# Patient Record
Sex: Female | Born: 1950 | Race: Black or African American | Hispanic: No | Marital: Married | Smoking: Never smoker
Health system: Southern US, Community
[De-identification: ages and names within clinical notes are randomized; demographics above are authoritative.]

## PROBLEM LIST (undated history)

## (undated) DIAGNOSIS — C801 Malignant (primary) neoplasm, unspecified: Secondary | ICD-10-CM

## (undated) DIAGNOSIS — E785 Hyperlipidemia, unspecified: Secondary | ICD-10-CM

## (undated) DIAGNOSIS — Z923 Personal history of irradiation: Secondary | ICD-10-CM

## (undated) DIAGNOSIS — Z973 Presence of spectacles and contact lenses: Secondary | ICD-10-CM

## (undated) DIAGNOSIS — I10 Essential (primary) hypertension: Secondary | ICD-10-CM

## (undated) HISTORY — PX: WISDOM TOOTH EXTRACTION: SHX21

## (undated) HISTORY — DX: Essential (primary) hypertension: I10

## (undated) HISTORY — PX: ABDOMINAL HYSTERECTOMY: SHX81

## (undated) HISTORY — PX: DIAGNOSTIC LAPAROSCOPY: SUR761

## (undated) HISTORY — PX: CATARACT EXTRACTION: SUR2

## (undated) HISTORY — PX: COLONOSCOPY W/ BIOPSIES AND POLYPECTOMY: SHX1376

---

## 2005-09-05 ENCOUNTER — Encounter: Admission: RE | Admit: 2005-09-05 | Discharge: 2005-09-05 | Payer: Self-pay | Admitting: Obstetrics & Gynecology

## 2006-09-08 ENCOUNTER — Encounter: Admission: RE | Admit: 2006-09-08 | Discharge: 2006-09-08 | Payer: Self-pay | Admitting: Obstetrics & Gynecology

## 2008-03-08 ENCOUNTER — Encounter: Admission: RE | Admit: 2008-03-08 | Discharge: 2008-03-08 | Payer: Self-pay | Admitting: Obstetrics & Gynecology

## 2009-04-10 ENCOUNTER — Encounter: Admission: RE | Admit: 2009-04-10 | Discharge: 2009-04-10 | Payer: Self-pay | Admitting: Obstetrics & Gynecology

## 2010-05-19 ENCOUNTER — Other Ambulatory Visit: Payer: Self-pay | Admitting: Family Medicine

## 2010-05-19 DIAGNOSIS — Z1239 Encounter for other screening for malignant neoplasm of breast: Secondary | ICD-10-CM

## 2010-06-04 ENCOUNTER — Ambulatory Visit: Payer: Self-pay

## 2010-06-20 ENCOUNTER — Ambulatory Visit
Admission: RE | Admit: 2010-06-20 | Discharge: 2010-06-20 | Disposition: A | Discharge status: Home / Self Care | Payer: BC Managed Care – PPO | Source: Ambulatory Visit | Attending: Family Medicine | Admitting: Family Medicine

## 2010-06-20 DIAGNOSIS — Z1239 Encounter for other screening for malignant neoplasm of breast: Secondary | ICD-10-CM

## 2011-07-09 ENCOUNTER — Other Ambulatory Visit: Payer: Self-pay | Admitting: Family Medicine

## 2011-07-09 DIAGNOSIS — Z1231 Encounter for screening mammogram for malignant neoplasm of breast: Secondary | ICD-10-CM

## 2011-07-15 ENCOUNTER — Ambulatory Visit
Admission: RE | Admit: 2011-07-15 | Discharge: 2011-07-15 | Disposition: A | Discharge status: Home / Self Care | Payer: BC Managed Care – PPO | Source: Ambulatory Visit | Attending: Family Medicine | Admitting: Family Medicine

## 2011-07-15 DIAGNOSIS — Z1231 Encounter for screening mammogram for malignant neoplasm of breast: Secondary | ICD-10-CM

## 2012-06-23 ENCOUNTER — Other Ambulatory Visit: Payer: Self-pay

## 2012-06-23 DIAGNOSIS — Z1231 Encounter for screening mammogram for malignant neoplasm of breast: Secondary | ICD-10-CM

## 2012-07-17 ENCOUNTER — Ambulatory Visit
Admission: RE | Admit: 2012-07-17 | Discharge: 2012-07-17 | Disposition: A | Discharge status: Home / Self Care | Payer: BC Managed Care – PPO | Source: Ambulatory Visit

## 2012-07-17 DIAGNOSIS — Z1231 Encounter for screening mammogram for malignant neoplasm of breast: Secondary | ICD-10-CM

## 2013-06-23 ENCOUNTER — Other Ambulatory Visit: Payer: Self-pay

## 2013-06-23 DIAGNOSIS — Z1231 Encounter for screening mammogram for malignant neoplasm of breast: Secondary | ICD-10-CM

## 2013-07-19 ENCOUNTER — Ambulatory Visit
Admission: RE | Admit: 2013-07-19 | Discharge: 2013-07-19 | Disposition: A | Discharge status: Home / Self Care | Payer: BC Managed Care – PPO | Source: Ambulatory Visit

## 2013-07-19 DIAGNOSIS — Z1231 Encounter for screening mammogram for malignant neoplasm of breast: Secondary | ICD-10-CM

## 2014-06-20 ENCOUNTER — Other Ambulatory Visit: Payer: Self-pay

## 2014-06-20 DIAGNOSIS — Z1231 Encounter for screening mammogram for malignant neoplasm of breast: Secondary | ICD-10-CM

## 2014-07-21 ENCOUNTER — Ambulatory Visit
Admission: RE | Admit: 2014-07-21 | Discharge: 2014-07-21 | Disposition: A | Discharge status: Home / Self Care | Payer: BC Managed Care – PPO | Source: Ambulatory Visit

## 2014-07-21 DIAGNOSIS — Z1231 Encounter for screening mammogram for malignant neoplasm of breast: Secondary | ICD-10-CM

## 2015-07-19 ENCOUNTER — Other Ambulatory Visit: Payer: Self-pay

## 2015-07-19 DIAGNOSIS — Z1231 Encounter for screening mammogram for malignant neoplasm of breast: Secondary | ICD-10-CM

## 2015-08-04 ENCOUNTER — Ambulatory Visit
Admission: RE | Admit: 2015-08-04 | Discharge: 2015-08-04 | Disposition: A | Discharge status: Home / Self Care | Payer: BC Managed Care – PPO | Source: Ambulatory Visit

## 2015-08-04 DIAGNOSIS — Z1231 Encounter for screening mammogram for malignant neoplasm of breast: Secondary | ICD-10-CM

## 2016-07-23 ENCOUNTER — Other Ambulatory Visit: Payer: Self-pay | Admitting: Family Medicine

## 2016-07-23 DIAGNOSIS — Z1231 Encounter for screening mammogram for malignant neoplasm of breast: Secondary | ICD-10-CM

## 2016-08-13 ENCOUNTER — Ambulatory Visit
Admission: RE | Admit: 2016-08-13 | Discharge: 2016-08-13 | Disposition: A | Discharge status: Home / Self Care | Payer: BC Managed Care – PPO | Source: Ambulatory Visit | Attending: Family Medicine | Admitting: Family Medicine

## 2016-08-13 DIAGNOSIS — Z1231 Encounter for screening mammogram for malignant neoplasm of breast: Secondary | ICD-10-CM

## 2017-04-29 HISTORY — PX: BREAST LUMPECTOMY: SHX2

## 2017-07-24 ENCOUNTER — Other Ambulatory Visit: Payer: Self-pay | Admitting: Family Medicine

## 2017-07-24 DIAGNOSIS — Z1231 Encounter for screening mammogram for malignant neoplasm of breast: Secondary | ICD-10-CM

## 2017-08-15 ENCOUNTER — Ambulatory Visit
Admission: RE | Admit: 2017-08-15 | Discharge: 2017-08-15 | Disposition: A | Discharge status: Home / Self Care | Payer: BC Managed Care – PPO | Source: Ambulatory Visit | Attending: Family Medicine | Admitting: Family Medicine

## 2017-08-15 ENCOUNTER — Encounter: Payer: Self-pay | Admitting: Radiology

## 2017-08-15 DIAGNOSIS — Z1231 Encounter for screening mammogram for malignant neoplasm of breast: Secondary | ICD-10-CM

## 2017-08-18 ENCOUNTER — Other Ambulatory Visit: Payer: Self-pay | Admitting: Family Medicine

## 2017-08-18 DIAGNOSIS — R921 Mammographic calcification found on diagnostic imaging of breast: Secondary | ICD-10-CM

## 2017-08-21 ENCOUNTER — Other Ambulatory Visit: Payer: Self-pay | Admitting: Family Medicine

## 2017-08-21 ENCOUNTER — Ambulatory Visit
Admission: RE | Admit: 2017-08-21 | Discharge: 2017-08-21 | Disposition: A | Discharge status: Home / Self Care | Payer: Medicare Other | Source: Ambulatory Visit | Attending: Family Medicine | Admitting: Family Medicine

## 2017-08-21 DIAGNOSIS — R921 Mammographic calcification found on diagnostic imaging of breast: Secondary | ICD-10-CM

## 2017-08-25 ENCOUNTER — Ambulatory Visit
Admission: RE | Admit: 2017-08-25 | Discharge: 2017-08-25 | Disposition: A | Discharge status: Home / Self Care | Payer: Medicare Other | Source: Ambulatory Visit | Attending: Family Medicine | Admitting: Family Medicine

## 2017-08-25 DIAGNOSIS — R921 Mammographic calcification found on diagnostic imaging of breast: Secondary | ICD-10-CM

## 2017-08-27 ENCOUNTER — Telehealth: Payer: Self-pay | Admitting: Hematology

## 2017-08-27 DIAGNOSIS — C801 Malignant (primary) neoplasm, unspecified: Secondary | ICD-10-CM

## 2017-08-27 HISTORY — DX: Malignant (primary) neoplasm, unspecified: C80.1

## 2017-08-27 NOTE — Telephone Encounter (Signed)
Spoke to patient to confirm afternoon [facility] appointment for 5/8, packet mailed to patient

## 2017-08-29 ENCOUNTER — Encounter: Payer: Self-pay | Admitting: *Deleted

## 2017-08-29 ENCOUNTER — Telehealth: Payer: Self-pay | Admitting: Hematology

## 2017-08-29 DIAGNOSIS — D0512 Intraductal carcinoma in situ of left breast: Secondary | ICD-10-CM

## 2017-08-29 NOTE — Telephone Encounter (Signed)
Spoke with patient to change arrive time for [facility] appointment, patient advised she will be here

## 2017-09-01 NOTE — Progress Notes (Signed)
[place]  [phone] Fax:([phone]) [place] Note   Patient Care Team: Donald Prose, MD as PCP - General (Family Medicine) Stark Klein, MD as Consulting Physician (General Surgery) Truitt Merle, MD as Consulting Physician (Hematology) Kyung Rudd, MD as Consulting Physician (Radiation Oncology) 09/03/2017  CHIEF COMPLAINTS/PURPOSE OF CONSULTATION:  Ductal carcinoma in situ (DCIS) of left breast  Oncology History   Cancer Staging Ductal carcinoma in situ (DCIS) of left breast Staging form: Breast, AJCC 8th Edition - Clinical stage from 08/25/2017: Stage 0 (cTis (DCIS), cN0, cM0, ER: Unknown, PR: Unknown, HER2: Not Assessed) - Signed by Truitt Merle, MD on 09/03/2017       Ductal carcinoma in situ (DCIS) of left breast   08/21/2017 Mammogram    IMPRESSION: New grouped pleomorphic calcifications within the upper-outer quadrant of the LEFT breast, spanning 4 mm extent. This is a suspicious finding for which stereotactic biopsy is recommended.      08/25/2017 Initial Biopsy    Diagnosis 08/25/17 Breast, left, needle core biopsy, upper outer quadrant coil clip - DUCTAL CARCINOMA IN SITU WITH CALCIFICATIONS, INTERMEDIATE GRADE. - DUCTAL PAPILLOMA. - SEE MICROSCOPIC DESCRIPTION.      08/25/2017 Cancer Staging    Staging form: Breast, AJCC 8th Edition - Clinical stage from 08/25/2017: Stage 0 (cTis (DCIS), cN0, cM0, ER: Unknown, PR: Unknown, HER2: Not Assessed) - Signed by Truitt Merle, MD on 09/03/2017      08/29/2017 Initial Diagnosis    Ductal carcinoma in situ (DCIS) of left breast       HISTORY OF PRESENTING ILLNESS:  Victoria Fuller 67 y.o. female is a here because of newly diagnosed DCIS. The patient was referred by her PCP. The patient presents to the clinic today accompanied by her husband and family member.  Prior to pt's abnormal mammogram, she states she did not feel any abnormality. She states she previously had no other abnormal  mammogram results and she was compliant with yearly screening. She denies any unintentional weight lost and she only has mild joint pain in her back occasionally.   Pt's screening mammogram from 08/15/17 warranted further evaluation. Her diagnostic mammogram from 08/21/17 revealed new grouped pleomorphic calcifications within the upper-outer quadrant of the LEFT breast, spanning 4 mm extent. Her initial biopsy confirmed DCIS.   She has a medical history of well-controlled HTN. She has a surgical history of hysterectomy.   GYN HISTORY  Menarchal: 11-12 LMP: Hysterectomy at age [age], she believes she had her ovaries removed HRT: none  G0P0:  She has a FMHx of Ovarian CA by her mother in her 77s. She denies tobacco use and she drinks alcohol rarely.   Socially, she is married and she is a retired Electrical engineer.   MEDICAL HISTORY:  Past Medical History:  Diagnosis Date  . Hypertension     SURGICAL HISTORY: Past Surgical History:  Procedure Laterality Date  . ABDOMINAL HYSTERECTOMY      SOCIAL HISTORY: Social History   Socioeconomic History  . Marital status: Married    Spouse name: Not on file  . Number of children: Not on file  . Years of education: Not on file  . Highest education level: Not on file  Occupational History  . Not on file  Social Needs  . Financial resource strain: Not on file  . Food insecurity:    Worry: Not on file    Inability: Not on file  . Transportation needs:    Medical: Not on file  Non-medical: Not on file  Tobacco Use  . Smoking status: Never Smoker  . Smokeless tobacco: Never Used  Substance and Sexual Activity  . Alcohol use: Not Currently  . Drug use: Never  . Sexual activity: Not on file  Lifestyle  . Physical activity:    Days per week: Not on file    Minutes per session: Not on file  . Stress: Not on file  Relationships  . Social connections:    Talks on phone: Not on file    Gets together: Not on file    Attends  religious service: Not on file    Active member of club or organization: Not on file    Attends meetings of clubs or organizations: Not on file    Relationship status: Not on file  . Intimate partner violence:    Fear of current or ex partner: Not on file    Emotionally abused: Not on file    Physically abused: Not on file    Forced sexual activity: Not on file  Other Topics Concern  . Not on file  Social History Narrative  . Not on file    FAMILY HISTORY: Family History  Problem Relation Age of Onset  . Cancer Mother 84       ovarian cancer     ALLERGIES:  has No Known Allergies.  MEDICATIONS:  Current Outpatient Medications  Medication Sig Dispense Refill  . amLODipine (NORVASC) 5 MG tablet Take 5 mg by mouth daily.    Marland Kitchen aspirin 81 MG chewable tablet Chew 81 mg by mouth daily.    Marland Kitchen atorvastatin (LIPITOR) 10 MG tablet Take 10 mg by mouth daily.    . hydrochlorothiazide (HYDRODIURIL) 25 MG tablet Take 25 mg by mouth daily.     No current facility-administered medications for this visit.     REVIEW OF SYSTEMS:   Constitutional: Denies fevers, chills or abnormal night sweats Eyes: Denies blurriness of vision, double vision or watery eyes Ears, nose, mouth, throat, and face: Denies mucositis or sore throat Respiratory: Denies cough, dyspnea or wheezes Cardiovascular: Denies palpitation, chest discomfort or lower extremity swelling Gastrointestinal:  Denies nausea, heartburn or change in bowel habits Skin: Denies abnormal skin rashes Lymphatics: Denies new lymphadenopathy or easy bruising Neurological:Denies numbness, tingling or new weaknesses Behavioral/Psych: Mood is stable, no new changes  MSK: (+) back pain, occasionally  All other systems were reviewed with the patient and are negative.  PHYSICAL EXAMINATION: ECOG PERFORMANCE STATUS: 0 - Asymptomatic  Vitals:   09/03/17 1238  BP: 131/81  Pulse: 75  Resp: 18  Temp: 98.3 F (36.8 C)  SpO2: 100%   Filed  Weights   09/03/17 1238  Weight: 180 lb 8 oz (81.9 kg)    GENERAL:alert, no distress and comfortable SKIN: skin color, texture, turgor are normal, no rashes or significant lesions EYES: normal, conjunctiva are pink and non-injected, sclera clear OROPHARYNX:no exudate, no erythema and lips, buccal mucosa, and tongue normal  NECK: supple, thyroid normal size, non-tender, without nodularity LYMPH:  no palpable lymphadenopathy in the cervical, axillary or inguinal LUNGS: clear to auscultation and percussion with normal breathing effort HEART: regular rate & rhythm and no murmurs and no lower extremity edema ABDOMEN:abdomen soft, non-tender and normal bowel sounds Musculoskeletal:no cyanosis of digits and no clubbing  PSYCH: alert & oriented x 3 with fluent speech NEURO: no focal motor/sensory deficits Breasts: Breast inspection showed them to be symmetrical with no nipple discharge. Palpation of the right breast and axilla  revealed no obvious mass that I could appreciate. (+) She has a 1 cm lump at the 1 o'clock position in the right breast.   LABORATORY DATA:  I have reviewed the data as listed CBC Latest Ref Rng & Units 09/03/2017  WBC 3.9 - 10.3 K/uL 3.4(L)  Hemoglobin 11.6 - 15.9 g/dL 13.5  Hematocrit 34.8 - 46.6 % 40.1  Platelets 145 - 400 K/uL 177   CMP Latest Ref Rng & Units 09/03/2017  Glucose 70 - 140 mg/dL 101  BUN 7 - 26 mg/dL 7  Creatinine 0.60 - 1.10 mg/dL 0.74  Sodium 136 - 145 mmol/L 142  Potassium 3.5 - 5.1 mmol/L 3.2(L)  Chloride 98 - 109 mmol/L 102  CO2 22 - 29 mmol/L 32(H)  Calcium 8.4 - 10.4 mg/dL 10.5(H)  Total Protein 6.4 - 8.3 g/dL 7.5  Total Bilirubin 0.2 - 1.2 mg/dL 1.9(H)  Alkaline Phos 40 - 150 U/L 73  AST 5 - 34 U/L 22  ALT 0 - 55 U/L 40    PATHOLOGY  Diagnosis 08/25/17 Breast, left, needle core biopsy, upper outer quadrant coil clip - DUCTAL CARCINOMA IN SITU WITH CALCIFICATIONS, INTERMEDIATE GRADE. - DUCTAL PAPILLOMA. - SEE MICROSCOPIC  DESCRIPTION. Microscopic Comment Estrogen and progesterone receptor will be performed. Dr. Melina Copa agrees. Called to [facility] on 08/26/17. (JDP:ah 08/26/17)  RADIOGRAPHIC STUDIES: I have personally reviewed the radiological images as listed and agreed with the findings in the report.  Diagnostic Mammogram 08/21/17 IMPRESSION: New grouped pleomorphic calcifications within the upper-outer quadrant of the LEFT breast, spanning 4 mm extent. This is a suspicious finding for which stereotactic biopsy is recommended.  Screening Mammogram 08/15/17 IMPRESSION: Further evaluation is suggested for calcifications in the left Breast.  Mm Digital Diagnostic Unilat L  Result Date: 08/21/2017 CLINICAL DATA:  Patient returns today to evaluate LEFT breast calcifications identified on recent screening mammogram. EXAM: DIGITAL DIAGNOSTIC LEFT MAMMOGRAM WITH CAD COMPARISON:  Previous exam(s). ACR Breast Density Category c: The breast tissue is heterogeneously dense, which may obscure small masses. FINDINGS: New grouped pleomorphic calcifications are confirmed within the upper outer quadrant of the LEFT breast, at middle depth, spanning 4 mm. Mammographic images were processed with CAD. IMPRESSION: New grouped pleomorphic calcifications within the upper-outer quadrant of the LEFT breast, spanning 4 mm extent. This is a suspicious finding for which stereotactic biopsy is recommended. RECOMMENDATION: Stereotactic biopsy, with 3D tomosynthesis guidance, for the LEFT breast calcifications. Stereotactic biopsy is scheduled for April 29th. I have discussed the findings and recommendations with the patient. Results were also provided in writing at the conclusion of the visit. If applicable, a reminder letter will be sent to the patient regarding the next appointment. BI-RADS CATEGORY  4: Suspicious. Electronically Signed   By: Franki Cabot M.D.   On: 08/21/2017 09:26   Mm Digital Screening  Bilateral  Result Date: 08/15/2017 CLINICAL DATA:  Screening. EXAM: DIGITAL SCREENING BILATERAL MAMMOGRAM WITH CAD COMPARISON:  Previous exam(s). ACR Breast Density Category c: The breast tissue is heterogeneously dense, which may obscure small masses. FINDINGS: In the left breast, calcifications warrant further evaluation. In the right breast, no findings suspicious for malignancy. Images were processed with CAD. IMPRESSION: Further evaluation is suggested for calcifications in the left breast. RECOMMENDATION: Diagnostic mammogram of the left breast. (Code:[id number]-30M) The patient will be contacted regarding the findings, and additional imaging will be scheduled. BI-RADS CATEGORY  0: Incomplete. Need additional imaging evaluation and/or prior mammograms for comparison. Electronically Signed   By: Remo Lipps  Joneen Caraway M.D.   On: 08/15/2017 16:41   Mm Clip Placement Left  Result Date: 08/25/2017 CLINICAL DATA:  Status post stereotactic guided core biopsy of calcifications in the UPPER-OUTER QUADRANT LEFT breast. EXAM: DIAGNOSTIC LEFT MAMMOGRAM POST STEREOTACTIC BIOPSY COMPARISON:  08/21/2017 and earlier FINDINGS: Mammographic images were obtained following stereotactic guided biopsy of calcifications in the UPPER-OUTER QUADRANT of the LEFT breast. A coil shaped clip is identified adjacent to residual calcifications in the UPPER-OUTER QUADRANT. IMPRESSION: Tissue marker clip in the expected location following biopsy. Final Assessment: Post Procedure Mammograms for Marker Placement Electronically Signed   By: Nolon Nations M.D.   On: 08/25/2017 13:35   Mm Lt Breast Bx W Loc Dev 1st Lesion Image Bx Spec Stereo Guide  Addendum Date: 08/26/2017   ADDENDUM REPORT: 08/26/2017 15:28 ADDENDUM: Pathology revealed INTERMEDIATE GRADE DUCTAL CARCINOMA IN SITU WITH CALCIFICATIONS, DUCTAL PAPILLOMA of the Left breast, upper outer quadrant (coil clip). This was found to be concordant by Dr. Nolon Nations. Pathology results were  discussed with the patient by telephone. The patient reported doing well after the biopsy with tenderness at the site. Post biopsy instructions and care were reviewed and questions were answered. The patient was encouraged to call [facility] for any additional concerns. The patient was referred to [facility] on Sep 03, 2017. Pathology results reported by Terie Purser, RN on 08/26/2017. Electronically Signed   By: Nolon Nations M.D.   On: 08/26/2017 15:28   Result Date: 08/26/2017 CLINICAL DATA:  Patient presents for stereotactic guided core biopsy of calcifications in the LEFT breast. EXAM: LEFT BREAST STEREOTACTIC CORE NEEDLE BIOPSY COMPARISON:  Previous exams. FINDINGS: The patient and I discussed the procedure of stereotactic-guided biopsy including benefits and alternatives. We discussed the high likelihood of a successful procedure. We discussed the risks of the procedure including infection, bleeding, tissue injury, clip migration, and inadequate sampling. Informed written consent was given. The usual time out protocol was performed immediately prior to the procedure. Using sterile technique and 1% Lidocaine as local anesthetic, under stereotactic guidance, a 9 gauge vacuum assisted device was used to perform core needle biopsy of calcifications in the UPPER-OUTER QUADRANT of the LEFT breast using a superior to inferior approach. Specimen radiograph was performed showing calcifications in multiple specimens. Specimens with calcifications are identified for pathology. Lesion quadrant: UPPER-OUTER QUADRANT LEFT breast At the conclusion of the procedure, a coil shaped tissue marker clip was deployed into the biopsy cavity. Follow-up 2-view mammogram was performed and dictated separately. IMPRESSION: Stereotactic-guided biopsy of LEFT breast calcifications. No apparent complications. Electronically Signed:  By: Nolon Nations M.D. On: 08/25/2017 13:26    ASSESSMENT & PLAN:  Victoria Fuller is 67 year old post-menopausal woman, presented with screening discovered to breast cancer.   1. 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 Stage 0 grade 2, ER/PR pending --We discussed her imaging findings and the biopsy results in great details. --She is a candidate for breast conservation surgery. She has been seen by breast surgeon Dr. Barry Dienes, who recommends lumpectomy. I discussed this is the standard treatment.  -I discussed that she may be a candidate for the COMET Study which will be surveillance vs surgery if ER or PR positive. She will think about it but right now she prefers to have her cancer removed.  -Her DCIS will be cured by complete surgical resection. Any form of adjuvant therapy is preventive. -We  also discussed that biopsy may have sampling limitation, we will review her surgical path, to see if she has any invasive carcinoma components. -She was also seen by radiation oncologist Dr. Lisbeth Renshaw today. She will need adjuvant Radiation Therapy to reduce her risk of local recurrence.  -She is candidate for genetic testing due to her personal history of cancer and her family history of cancer. She would like to think about it. I discussed that if she had a gene mutation such as BRCA1 or BRCA2, we may recommend her to have mastectomy.  -Her ER/PR results are pending but if it is positive I would recommend adjuvant endocrine therapy for a total of 5 years to prevent future breast cancer. Potential benefits and side effects were discussed with patient and she is interested. Plan to start once she recovers from radiation if she is a candidate.  -I reviewed her labs with her today, she has elevated bilirubin at 1.9, and decreased potasium. I encouraged her increase K in her diet.  -We also discussed the breast cancer surveillance after her surgery. She will continue annual  screening mammogram, self exam, and a routine office visit with lab and exam with [place]. -I encouraged her to have healthy diet and exercise regularly  2.  Hypertension and dyslipidemia -Continue medication and follow-up with primary care physician  PLAN: -ER/PR results pending  -She will think about genetics -She prefers surgery so she will likely proceed with lumpectomy with Dr. Barry Dienes  -I will see her after adjuvant radiation, or sooner after surgery if needed.  No orders of the defined types were placed in this encounter.  All questions were answered. The patient knows to call the clinic with any problems, questions or concerns.  I spent 40 minutes counseling the patient face to face. The total time spent in the appointment was 45 minutes and more than 50% was on counseling.  This document serves as a record of services personally performed by Truitt Merle, MD. It was created on her behalf by Theresia Bough, a trained medical scribe. The creation of this record is based on the scribe's personal observations and the provider's statements to them.   I have reviewed the above documentation for accuracy and completeness, and I agree with the above.    Truitt Merle, MD 09/03/2017

## 2017-09-02 ENCOUNTER — Other Ambulatory Visit: Payer: Self-pay

## 2017-09-02 DIAGNOSIS — D0512 Intraductal carcinoma in situ of left breast: Secondary | ICD-10-CM

## 2017-09-03 ENCOUNTER — Encounter: Payer: Self-pay | Admitting: Hematology

## 2017-09-03 ENCOUNTER — Ambulatory Visit
Admission: RE | Admit: 2017-09-03 | Discharge: 2017-09-03 | Disposition: A | Discharge status: Home / Self Care | Payer: Medicare Other | Source: Ambulatory Visit | Attending: Radiation Oncology | Admitting: Radiation Oncology

## 2017-09-03 ENCOUNTER — Inpatient Hospital Stay: Payer: Medicare Other

## 2017-09-03 ENCOUNTER — Inpatient Hospital Stay: Payer: Medicare Other | Attending: Hematology | Admitting: Hematology

## 2017-09-03 ENCOUNTER — Ambulatory Visit: Payer: Medicare Other | Admitting: Physical Therapy

## 2017-09-03 ENCOUNTER — Other Ambulatory Visit: Payer: Self-pay | Admitting: General Surgery

## 2017-09-03 ENCOUNTER — Encounter: Payer: Self-pay | Admitting: General Practice

## 2017-09-03 VITALS — BP 131/81 | HR 75 | Temp 98.3°F | Resp 18 | Ht 68.5 in | Wt 180.5 lb

## 2017-09-03 DIAGNOSIS — D0512 Intraductal carcinoma in situ of left breast: Secondary | ICD-10-CM

## 2017-09-03 DIAGNOSIS — Z7982 Long term (current) use of aspirin: Secondary | ICD-10-CM | POA: Diagnosis not present

## 2017-09-03 DIAGNOSIS — Z780 Asymptomatic menopausal state: Secondary | ICD-10-CM | POA: Diagnosis not present

## 2017-09-03 DIAGNOSIS — R17 Unspecified jaundice: Secondary | ICD-10-CM | POA: Insufficient documentation

## 2017-09-03 DIAGNOSIS — Z79899 Other long term (current) drug therapy: Secondary | ICD-10-CM | POA: Diagnosis not present

## 2017-09-03 DIAGNOSIS — I10 Essential (primary) hypertension: Secondary | ICD-10-CM | POA: Diagnosis not present

## 2017-09-03 DIAGNOSIS — C50412 Malignant neoplasm of upper-outer quadrant of left female breast: Secondary | ICD-10-CM

## 2017-09-03 DIAGNOSIS — E785 Hyperlipidemia, unspecified: Secondary | ICD-10-CM | POA: Insufficient documentation

## 2017-09-03 LAB — CBC WITH DIFFERENTIAL (CANCER CENTER ONLY)
Basophils Absolute: 0 10*3/uL (ref 0.0–0.1)
Basophils Relative: 1 %
Eosinophils Absolute: 0.1 10*3/uL (ref 0.0–0.5)
Eosinophils Relative: 2 %
HCT: 40.1 % (ref 34.8–46.6)
Hemoglobin: 13.5 g/dL (ref 11.6–15.9)
Lymphocytes Relative: 29 %
Lymphs Abs: 1 10*3/uL (ref 0.9–3.3)
MCH: 29.3 pg (ref 25.1–34.0)
MCHC: 33.7 g/dL (ref 31.5–36.0)
MCV: 86.8 fL (ref 79.5–101.0)
Monocytes Absolute: 0.3 10*3/uL (ref 0.1–0.9)
Monocytes Relative: 9 %
Neutro Abs: 2 10*3/uL (ref 1.5–6.5)
Neutrophils Relative %: 59 %
Platelet Count: 177 10*3/uL (ref 145–400)
RBC: 4.62 MIL/uL (ref 3.70–5.45)
RDW: 13.4 % (ref 11.2–14.5)
WBC Count: 3.4 10*3/uL — ABNORMAL LOW (ref 3.9–10.3)

## 2017-09-03 LAB — CMP (CANCER CENTER ONLY)
ALT: 40 U/L (ref 0–55)
AST: 22 U/L (ref 5–34)
Albumin: 4.7 g/dL (ref 3.5–5.0)
Alkaline Phosphatase: 73 U/L (ref 40–150)
Anion gap: 8 (ref 3–11)
BUN: 7 mg/dL (ref 7–26)
CO2: 32 mmol/L — ABNORMAL HIGH (ref 22–29)
Calcium: 10.5 mg/dL — ABNORMAL HIGH (ref 8.4–10.4)
Chloride: 102 mmol/L (ref 98–109)
Creatinine: 0.74 mg/dL (ref 0.60–1.10)
GFR, Est AFR Am: >60 mL/min (ref >60)
GFR, Estimated: >60 mL/min (ref >60)
Glucose, Bld: 101 mg/dL (ref 70–140)
Potassium: 3.2 mmol/L — ABNORMAL LOW (ref 3.5–5.1)
Sodium: 142 mmol/L (ref 136–145)
Total Bilirubin: 1.9 mg/dL — ABNORMAL HIGH (ref 0.2–1.2)
Total Protein: 7.5 g/dL (ref 6.4–8.3)

## 2017-09-03 NOTE — Progress Notes (Signed)
Nutrition Assessment  Reason for Assessment:  Pt seen in Breast Clinic  ASSESSMENT:   67 year old female with new diagnosis of breast cancer.  Past medical history reviewed.  Patient reports normal appetite.  Medications:  reviewed  Labs: reviewed  Anthropometrics:   Height: 68.5 inches Weight: 180 lb BMI: 27   NUTRITION DIAGNOSIS: Food and nutrition related knowledge deficit related to new diagnosis of breast cancer as evidenced by no prior need for nutrition related information.  INTERVENTION:   Discussed and provided packet of information regarding nutritional tips for breast cancer patients.  Questions answered.  Teachback method used.  Contact information provided and patient knows to contact me with questions/concerns.    MONITORING, EVALUATION, and GOAL: Pt will consume a healthy plant based diet to maintain lean body mass throughout treatment.   Kassidy Dockendorf B. Zenia Resides, [place], [place] Registered Dietitian [phone] (pager)

## 2017-09-03 NOTE — Progress Notes (Signed)
[place] Psychosocial Distress Screening Spiritual Care  Met with Saidi and husband Casimer Bilis in [facility] to introduce [facility] team/resources, reviewing distress screen per protocol.  The patient scored a 4 on the Psychosocial Distress Thermometer which indicates moderate distress. Also assessed for distress and other psychosocial needs.   ONCBCN DISTRESS SCREENING 09/03/2017  Screening Type Initial Screening  Distress experienced in past week (1-10) 4  Emotional problem type Nervousness/Anxiety  Information Concerns Type Lack of info about treatment;Lack of info about complementary therapy choices  Referral to support programs Yes   Tannya and Casimer Bilis were receptive to [place] and info about [place] team/programming. Provided empathic listening and emotional support, normalizing feelings (and tears), encouraging self-care, and introducing Support Team and programming.  Follow up needed: No. Couple knows to contact Team with any needs, questions, or interests. Encouraged them to reach out and to try programs for connection. Please also page if immediate needs arise or circumstances change. Thank you.   [place], [place], [facility] Pager [phone] Voicemail [phone]

## 2017-09-03 NOTE — Progress Notes (Addendum)
Radiation Oncology         [phone] ________________________________  Name: Victoria Fuller        MRN: [id number]  Date of Service: 09/03/2017 DOB: Aug 21, 1950  CC:Sun, Gari Crown, MD  Stark Klein, MD     REFERRING PHYSICIAN: Stark Klein, MD   DIAGNOSIS: The encounter diagnosis was Ductal carcinoma in situ (DCIS) of left breast.   HISTORY OF PRESENT ILLNESS: Victoria Fuller is a 67 y.o. female seen in the multidisciplinary breast clinic for a new diagnosis of left breast cancer. The patient was noted to have screening detected calcifications. She underwent diagnostic imaging and this revealed a 4 mm area of calcifications on mammography, and underwent stereotactic biopsy on 08/25/17. Final pathology revealed DCIS, intermediate grade with calcifications, her ER/PR testing is pending. She comes today to discuss options of treatment for her cancer.  PREVIOUS RADIATION THERAPY: No   PAST MEDICAL HISTORY:  Past Medical History:  Diagnosis Date  . Hypertension        PAST SURGICAL HISTORY: Past Surgical History:  Procedure Laterality Date  . ABDOMINAL HYSTERECTOMY       FAMILY HISTORY:  Family History  Problem Relation Age of Onset  . Cancer Mother 95       ovarian cancer      SOCIAL HISTORY:  reports that she has never smoked. She has never used smokeless tobacco. She reports that she does not use drugs. The patient is married and lives in [place]. 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     ALLERGIES: Patient has no known allergies.   MEDICATIONS:  Current Outpatient Medications  Medication Sig Dispense Refill  . amLODipine (NORVASC) 5 MG tablet Take 5 mg by mouth daily.    Marland Kitchen aspirin 81 MG chewable tablet Chew 81 mg by mouth daily.    Marland Kitchen atorvastatin (LIPITOR) 10 MG tablet Take 10 mg by mouth daily.    . hydrochlorothiazide (HYDRODIURIL) 25 MG tablet Take 25 mg by mouth daily.     No current facility-administered medications for this  encounter.      REVIEW OF SYSTEMS: On review of systems, the patient reports that she is doing well overall. She denies any chest pain, shortness of breath, cough, fevers, chills, night sweats, unintended weight changes. She denies any bowel or bladder disturbances, and denies abdominal pain, nausea or vomiting. She denies any new musculoskeletal or joint aches or pains. A complete review of systems is obtained and is otherwise negative.     PHYSICAL EXAM:  Wt Readings from Last 3 Encounters:  09/03/17 180 lb 8 oz (81.9 kg)   Temp Readings from Last 3 Encounters:  09/03/17 98.3 F (36.8 C) (Oral)   BP Readings from Last 3 Encounters:  09/03/17 131/81   Pulse Readings from Last 3 Encounters:  09/03/17 75     In general this is a well appearing African American female in no acute distress. She is alert and oriented x4 and appropriate throughout the examination. HEENT reveals that the patient is normocephalic, atraumatic. EOMs are intact. PERRLA. Skin is intact without any evidence of gross lesions. Cardiovascular exam reveals a regular rate and rhythm, no clicks rubs or murmurs are auscultated. Chest is clear to auscultation bilaterally. Lymphatic assessment is performed and does not reveal any adenopathy in the cervical, supraclavicular, axillary, or inguinal chains. Bilateral breast exam is performed and reveals no mass in the right breast, the left has an area of induration deep to the biopsy site consistent with  prior biopsy. Abdomen has active bowel sounds in all quadrants and is intact. The abdomen is soft, non tender, non distended. Lower extremities are negative for pretibial pitting edema, deep calf tenderness, cyanosis or clubbing.   ECOG = 0  0 - Asymptomatic (Fully active, able to carry on all predisease activities without restriction)  1 - Symptomatic but completely ambulatory (Restricted in physically strenuous activity but ambulatory and able to carry out work of a light  or sedentary nature. For example, light housework, office work)  2 - Symptomatic, <50% in bed during the day (Ambulatory and capable of all self care but unable to carry out any work activities. Up and about more than 50% of waking hours)  3 - Symptomatic, >50% in bed, but not bedbound (Capable of only limited self-care, confined to bed or chair 50% or more of waking hours)  4 - Bedbound (Completely disabled. Cannot carry on any self-care. Totally confined to bed or chair)  5 - Death   Eustace Pen MM, Creech RH, Tormey DC, et al. [phone]). "Toxicity and response criteria of the [facility]". [place] Oncol. 5 (6): 649-55    LABORATORY DATA:  Lab Results  Component Value Date   WBC 3.4 (L) 09/03/2017   HGB 13.5 09/03/2017   HCT 40.1 09/03/2017   MCV 86.8 09/03/2017   PLT 177 09/03/2017   Lab Results  Component Value Date   NA 142 09/03/2017   K 3.2 (L) 09/03/2017   CL 102 09/03/2017   CO2 32 (H) 09/03/2017   Lab Results  Component Value Date   ALT 40 09/03/2017   AST 22 09/03/2017   ALKPHOS 73 09/03/2017   BILITOT 1.9 (H) 09/03/2017      RADIOGRAPHY: Mm Digital Diagnostic Unilat L  Result Date: 08/21/2017 CLINICAL DATA:  Patient returns today to evaluate LEFT breast calcifications identified on recent screening mammogram. EXAM: DIGITAL DIAGNOSTIC LEFT MAMMOGRAM WITH CAD COMPARISON:  Previous exam(s). ACR Breast Density Category c: The breast tissue is heterogeneously dense, which may obscure small masses. FINDINGS: New grouped pleomorphic calcifications are confirmed within the upper outer quadrant of the LEFT breast, at middle depth, spanning 4 mm. Mammographic images were processed with CAD. IMPRESSION: New grouped pleomorphic calcifications within the upper-outer quadrant of the LEFT breast, spanning 4 mm extent. This is a suspicious finding for which stereotactic biopsy is recommended. RECOMMENDATION: Stereotactic biopsy, with 3D tomosynthesis guidance,  for the LEFT breast calcifications. Stereotactic biopsy is scheduled for April 29th. I have discussed the findings and recommendations with the patient. Results were also provided in writing at the conclusion of the visit. If applicable, a reminder letter will be sent to the patient regarding the next appointment. BI-RADS CATEGORY  4: Suspicious. Electronically Signed   By: Franki Cabot M.D.   On: 08/21/2017 09:26   Mm Digital Screening Bilateral  Result Date: 08/15/2017 CLINICAL DATA:  Screening. EXAM: DIGITAL SCREENING BILATERAL MAMMOGRAM WITH CAD COMPARISON:  Previous exam(s). ACR Breast Density Category c: The breast tissue is heterogeneously dense, which may obscure small masses. FINDINGS: In the left breast, calcifications warrant further evaluation. In the right breast, no findings suspicious for malignancy. Images were processed with CAD. IMPRESSION: Further evaluation is suggested for calcifications in the left breast. RECOMMENDATION: Diagnostic mammogram of the left breast. (Code:[id number]) The patient will be contacted regarding the findings, and additional imaging will be scheduled. BI-RADS CATEGORY  0: Incomplete. Need additional imaging evaluation and/or prior mammograms for comparison. Electronically Signed   By:  Claudie Revering M.D.   On: 08/15/2017 16:41   Mm Clip Placement Left  Result Date: 08/25/2017 CLINICAL DATA:  Status post stereotactic guided core biopsy of calcifications in the UPPER-OUTER QUADRANT LEFT breast. EXAM: DIAGNOSTIC LEFT MAMMOGRAM POST STEREOTACTIC BIOPSY COMPARISON:  08/21/2017 and earlier FINDINGS: Mammographic images were obtained following stereotactic guided biopsy of calcifications in the UPPER-OUTER QUADRANT of the LEFT breast. A coil shaped clip is identified adjacent to residual calcifications in the UPPER-OUTER QUADRANT. IMPRESSION: Tissue marker clip in the expected location following biopsy. Final Assessment: Post Procedure Mammograms for Marker Placement  Electronically Signed   By: Nolon Nations M.D.   On: 08/25/2017 13:35   Mm Lt Breast Bx W Loc Dev 1st Lesion Image Bx Spec Stereo Guide  Addendum Date: 08/26/2017   ADDENDUM REPORT: 08/26/2017 15:28 ADDENDUM: Pathology revealed INTERMEDIATE GRADE DUCTAL CARCINOMA IN SITU WITH CALCIFICATIONS, DUCTAL PAPILLOMA of the Left breast, upper outer quadrant (coil clip). This was found to be concordant by Dr. Nolon Nations. Pathology results were discussed with the patient by telephone. The patient reported doing well after the biopsy with tenderness at the site. Post biopsy instructions and care were reviewed and questions were answered. The patient was encouraged to call [facility] for any additional concerns. The patient was referred to [facility] on Sep 03, 2017. Pathology results reported by Terie Purser, RN on 08/26/2017. Electronically Signed   By: Nolon Nations M.D.   On: 08/26/2017 15:28   Result Date: 08/26/2017 CLINICAL DATA:  Patient presents for stereotactic guided core biopsy of calcifications in the LEFT breast. EXAM: LEFT BREAST STEREOTACTIC CORE NEEDLE BIOPSY COMPARISON:  Previous exams. FINDINGS: The patient and I discussed the procedure of stereotactic-guided biopsy including benefits and alternatives. We discussed the high likelihood of a successful procedure. We discussed the risks of the procedure including infection, bleeding, tissue injury, clip migration, and inadequate sampling. Informed written consent was given. The usual time out protocol was performed immediately prior to the procedure. Using sterile technique and 1% Lidocaine as local anesthetic, under stereotactic guidance, a 9 gauge vacuum assisted device was used to perform core needle biopsy of calcifications in the UPPER-OUTER QUADRANT of the LEFT breast using a superior to inferior approach. Specimen radiograph was  performed showing calcifications in multiple specimens. Specimens with calcifications are identified for pathology. Lesion quadrant: UPPER-OUTER QUADRANT LEFT breast At the conclusion of the procedure, a coil shaped tissue marker clip was deployed into the biopsy cavity. Follow-up 2-view mammogram was performed and dictated separately. IMPRESSION: Stereotactic-guided biopsy of LEFT breast calcifications. No apparent complications. Electronically Signed: By: Nolon Nations M.D. On: 08/25/2017 13:26       IMPRESSION/PLAN: 1. DCIS of the left breast. Dr. Lisbeth Renshaw discusses the pathology findings and reviews the nature of non invasive breast disease. The consensus from the breast conference includes breast conservation with lumpectomy versus consideration of the COMET trial, which she has been counseled regarding and has opted against. Rather she will proceed with lumpectomy. We are still awaiting the results of her prognostic panel. The patient's course would then be followed by external radiotherapy to the breast followed by antiestrogen therapy if her prognostic panels indicated ER positivity. We discussed the risks, benefits, short, and long term effects of radiotherapy, and the patient is interested in proceeding. Dr. Lisbeth Renshaw discusses the delivery and logistics of radiotherapy and anticipates a course of 4 weeks of radiotherapy with deep inspiration  breath hold technique. We will see her back about 2 weeks after surgery to discuss the simulation process and anticipate we starting radiotherapy about 4-6 weeks after surgery.  2. Possible genetic predisposition to malignancy. The patient is a candidate for genetic testing given her personal and family history. She was offered referral and is considering testing.    The above documentation reflects my direct findings during this shared patient visit. Please see the separate note by Dr. Lisbeth Renshaw on this date for the remainder of the patient's plan of  care.    Carola Rhine, PAC

## 2017-09-04 ENCOUNTER — Telehealth: Payer: Self-pay | Admitting: Hematology

## 2017-09-04 ENCOUNTER — Other Ambulatory Visit: Payer: Self-pay | Admitting: General Surgery

## 2017-09-04 NOTE — Telephone Encounter (Signed)
No LOS 5/8 °

## 2017-09-05 ENCOUNTER — Encounter (HOSPITAL_BASED_OUTPATIENT_CLINIC_OR_DEPARTMENT_OTHER): Payer: Self-pay | Admitting: *Deleted

## 2017-09-05 ENCOUNTER — Other Ambulatory Visit: Payer: Self-pay | Admitting: General Surgery

## 2017-09-05 ENCOUNTER — Other Ambulatory Visit: Payer: Self-pay

## 2017-09-05 DIAGNOSIS — C50412 Malignant neoplasm of upper-outer quadrant of left female breast: Secondary | ICD-10-CM

## 2017-09-08 ENCOUNTER — Encounter (HOSPITAL_BASED_OUTPATIENT_CLINIC_OR_DEPARTMENT_OTHER)
Admission: RE | Admit: 2017-09-08 | Discharge: 2017-09-08 | Disposition: A | Discharge status: Home / Self Care | Payer: Medicare Other | Source: Ambulatory Visit | Attending: General Surgery | Admitting: General Surgery

## 2017-09-08 ENCOUNTER — Other Ambulatory Visit: Payer: Self-pay

## 2017-09-08 DIAGNOSIS — I10 Essential (primary) hypertension: Secondary | ICD-10-CM | POA: Diagnosis not present

## 2017-09-08 DIAGNOSIS — D0512 Intraductal carcinoma in situ of left breast: Secondary | ICD-10-CM | POA: Diagnosis not present

## 2017-09-08 NOTE — H&P (Signed)
Clydell Hakim Appointment: 09/03/2017 1:00 PM Location: [facility] Patient #: [id number] DOB: 1951-03-14 Undefined / Language: Cleophus Molt / Race: Black or African American Female   History of Present Illness Stark Klein MD; 09/04/2017 12:22 AM) The patient is a 67 year old female who presents with breast cancer. Pt is a 67 yo F dx wtih left breast cancer 08/25/2017. She presented with screening detected left breast calcifications. She had a 4 mm area of calcs in the upper outer quadrant of the left breast. Core needle biopsy was performed and showed intermediate grade DCIS wtih calcs. ER/PR pending. She has no personal or family history of cancer. She is a retired Research scientist (life sciences). She has never smoked and does not use alcohol or drugs. She had menarche at age 58 or 64. She had hysterectomy age 58. She did not use HRT, and used OCPs for around 2 years. She is nulliparous but has adopted children. She is up to date with her colonoscopy and bone density study.    Mm Digital Diagnostic Unilat L  Result Date: 08/21/2017 CLINICAL DATA: Patient returns today to evaluate LEFT breast calcifications identified on recent screening mammogram. EXAM: DIGITAL DIAGNOSTIC LEFT MAMMOGRAM WITH CAD COMPARISON: Previous exam(s). ACR Breast Density Category c: The breast tissue is heterogeneously dense, which may obscure small masses. FINDINGS: New grouped pleomorphic calcifications are confirmed within the upper outer quadrant of the LEFT breast, at middle depth, spanning 4 mm. Mammographic images were processed with CAD. IMPRESSION: New grouped pleomorphic calcifications within the upper-outer quadrant of the LEFT breast, spanning 4 mm extent. This is a suspicious finding for which stereotactic biopsy is recommended. RECOMMENDATION: Stereotactic biopsy, with 3D tomosynthesis guidance, for the LEFT breast calcifications. BI-RADS CATEGORY 4: Suspicious. Electronically Signed By: Franki Cabot M.D. On: 08/21/2017 09:26  Mm Digital Screening Bilateral  Result Date: 08/15/2017 CLINICAL DATA: Screening. EXAM: DIGITAL SCREENING BILATERAL MAMMOGRAM WITH CAD COMPARISON: Previous exam(s). ACR Breast Density Category c: The breast tissue is heterogeneously dense, which may obscure small masses. FINDINGS: In the left breast, calcifications warrant further evaluation. In the right breast, no findings suspicious for malignancy. Images were processed with CAD. IMPRESSION: Further evaluation is suggested for calcifications in the left breast. RECOMMENDATION: Diagnostic mammogram of the left breast. (Code:[id number]) The patient will be contacted regarding the findings, and additional imaging will be scheduled. BI-RADS CATEGORY 0: Incomplete. Need additional imaging evaluation and/or prior mammograms for comparison. Electronically Signed By: Claudie Revering M.D. On: 08/15/2017 16:41  pathology 08/25/2017 Diagnosis Breast, left, needle core biopsy, upper outer quadrant coil clip - DUCTAL CARCINOMA IN SITU WITH CALCIFICATIONS, INTERMEDIATE GRADE. - DUCTAL PAPILLOMA. - SEE MICROSCOPIC DESCRIPTION. RECEPTORS PENDING  Recent Results (from the past 2160 hour(s)) CBC with Differential (Cancer Center Only) Status: Abnormal Collection Time: 09/03/17 12:27 PM Result Value Ref Range WBC Count 3.4 (L) 3.9 - 10.3 K/uL RBC 4.62 3.70 - 5.45 MIL/uL Hemoglobin 13.5 11.6 - 15.9 g/dL HCT 40.1 34.8 - 46.6 % MCV 86.8 79.5 - 101.0 fL MCH 29.3 25.1 - 34.0 pg MCHC 33.7 31.5 - 36.0 g/dL RDW 13.4 11.2 - 14.5 % Platelet Count 177 145 - 400 K/uL Neutrophils Relative % 59 % Neutro Abs 2.0 1.5 - 6.5 K/uL Lymphocytes Relative 29 % Lymphs Abs 1.0 0.9 - 3.3 K/uL Monocytes Relative 9 % Monocytes Absolute 0.3 0.1 - 0.9 K/uL Eosinophils Relative 2 % Eosinophils Absolute 0.1 0.0 - 0.5 K/uL Basophils Relative 1 % Basophils Absolute 0.0 0.0 - 0.1 K/uL Comment: Performed at [facility], [place] [address] [place], [place] [address] CMP ([place] only) Status: Abnormal Collection Time: 09/03/17 12:27 PM Result Value Ref Range Sodium 142 136 - 145 mmol/L Potassium 3.2 (L) 3.5 - 5.1 mmol/L Chloride 102 98 - 109 mmol/L CO2 32 (H) 22 - 29 mmol/L Glucose, Bld 101 70 - 140 mg/dL BUN 7 7 - 26 mg/dL Creatinine 0.74 0.60 - 1.10 mg/dL Calcium 10.5 (H) 8.4 - 10.4 mg/dL Total Protein 7.5 6.4 - 8.3 g/dL Albumin 4.7 3.5 - 5.0 g/dL AST 22 5 - 34 U/L ALT 40 0 - 55 U/L Alkaline Phosphatase 73 40 - 150 U/L Total Bilirubin 1.9 (H) 0.2 - 1.2 mg/dL GFR, Est Non Af Am >60 >60 mL/min GFR, Est AFR Am >60 >60 mL/min Comment: (NOTE) The eGFR has been calculated using the CKD EPI equation. This calculation has not been validated in all clinical situations. eGFR's persistently <60 mL/min signify possible Chronic Kidney Disease. Anion gap 8 3 - 11 Comment: Performed at [facility], [address] [place], [place] [address]    Past Surgical History Tawni Pummel, RN; 09/03/2017 7:37 AM) Breast Biopsy  Left. Colon Polyp Removal - Colonoscopy  Hysterectomy (due to cancer) - Partial  Oral Surgery   Diagnostic Studies History Tawni Pummel, RN; 09/03/2017 7:37 AM) Colonoscopy  5-10 years ago Mammogram  within last year Pap Smear  >5 years ago  Medication History Tawni Pummel, RN; 09/03/2017 7:43 AM) Medications Reconciled  Social History Tawni Pummel, RN; 09/03/2017 7:37 AM) Alcohol use  Occasional alcohol use. Caffeine use  Carbonated beverages, Coffee, Tea. No drug use  Tobacco use  Never smoker.  Family History Tawni Pummel, RN; 09/03/2017 7:37 AM) Alcohol Abuse  Family Members In General. Arthritis  Father. Cerebrovascular Accident  Family Members In General. Depression  Brother, Father. Diabetes Mellitus  Family Members In General, Father. Heart Disease  Family Members In General. Hypertension  Family Members  In General, Father, Mother. Ovarian Cancer  Mother.  Pregnancy / Birth History Tawni Pummel, RN; 09/03/2017 7:37 AM) Age at menarche  33 years. Age of menopause  [age] Contraceptive History  Oral contraceptives. Irregular periods   Other Problems Tawni Pummel, RN; 09/03/2017 7:37 AM) Back Pain  Breast Cancer  Hemorrhoids  High blood pressure  Hypercholesterolemia  Lump In Breast  Migraine Headache     Review of Systems Sunday Spillers Ledford RN; 09/03/2017 7:37 AM) General Present- Weight Loss. Not Present- Appetite Loss, Chills, Fatigue, Fever, Night Sweats and Weight Gain. Skin Not Present- Change in Wart/Mole, Dryness, Hives, Jaundice, New Lesions, Non-Healing Wounds, Rash and Ulcer. HEENT Present- Wears glasses/contact lenses. Not Present- Earache, Hearing Loss, Hoarseness, Nose Bleed, Oral Ulcers, Ringing in the Ears, Seasonal Allergies, Sinus Pain, Sore Throat, Visual Disturbances and Yellow Eyes. Respiratory Present- Snoring. Not Present- Bloody sputum, Chronic Cough, Difficulty Breathing and Wheezing. Breast Not Present- Breast Mass, Breast Pain, Nipple Discharge and Skin Changes. Cardiovascular Present- Swelling of Extremities. Not Present- Chest Pain, Difficulty Breathing Lying Down, Leg Cramps, Palpitations, Rapid Heart Rate and Shortness of Breath. Gastrointestinal Present- Excessive gas and Hemorrhoids. Not Present- Abdominal Pain, Bloating, Bloody Stool, Change in Bowel Habits, Chronic diarrhea, Constipation, Difficulty Swallowing, Gets full quickly at meals, Indigestion, Nausea, Rectal Pain and Vomiting. Female Genitourinary Not Present- Frequency, Nocturia, Painful Urination, Pelvic Pain and Urgency. Musculoskeletal Present- Back Pain. Not Present- Joint Pain, Joint Stiffness, Muscle Pain, Muscle Weakness and Swelling of Extremities. Psychiatric Not Present- Anxiety, Bipolar, Change in Sleep Pattern, Depression, Fearful and Frequent crying. Endocrine  Not Present-  Cold Intolerance, Excessive Hunger, Hair Changes, Heat Intolerance, Hot flashes and New Diabetes. Hematology Present- Blood Thinners. Not Present- Easy Bruising, Excessive bleeding, Gland problems, HIV and Persistent Infections.  Vitals Stark Klein MD; 09/04/2017 12:01 AM) 09/04/2017 12:01 AM Weight: 180.5 lb Height: 68in Body Surface Area: 1.96 m Body Mass Index: 27.44 kg/m  Temp.: 98.64F  Pulse: 75 (Regular)  Resp.: 18 (Unlabored)  BP: 131/81 (Sitting, Left Arm, Standard)       Physical Exam Stark Klein MD; 09/04/2017 12:24 AM) General Mental Status-Alert. General Appearance-Consistent with stated age. Hydration-Well hydrated. Voice-Normal.  Head and Neck Head-normocephalic, atraumatic with no lesions or palpable masses. Trachea-midline. Thyroid Gland Characteristics - normal size and consistency.  Eye Eyeball - Bilateral-Extraocular movements intact. Sclera/Conjunctiva - Bilateral-No scleral icterus.  Chest and Lung Exam Chest and lung exam reveals -quiet, even and easy respiratory effort with no use of accessory muscles and on auscultation, normal breath sounds, no adventitious sounds and normal vocal resonance. Inspection Chest Wall - Normal. Back - normal.  Breast Note: breasts are reasonably symmetric. No palpable mass. ptosis present. no nipple discharge or retraction. No skin dimpling. heterogeneously dense breast tissue. no LAD. some bruising on left upper outer breast.   Cardiovascular Cardiovascular examination reveals -normal heart sounds, regular rate and rhythm with no murmurs and normal pedal pulses bilaterally.  Abdomen Inspection Inspection of the abdomen reveals - No Hernias. Palpation/Percussion Palpation and Percussion of the abdomen reveal - Soft, Non Tender, No Rebound tenderness, No Rigidity (guarding) and No hepatosplenomegaly. Auscultation Auscultation of the abdomen reveals - Bowel sounds  normal.  Neurologic Neurologic evaluation reveals -alert and oriented x 3 with no impairment of recent or remote memory. Mental Status-Normal.  Musculoskeletal Global Assessment -Note: no gross deformities.  Normal Exam - Left-Upper Extremity Strength Normal and Lower Extremity Strength Normal. Normal Exam - Right-Upper Extremity Strength Normal and Lower Extremity Strength Normal.  Lymphatic Head & Neck  General Head & Neck Lymphatics: Bilateral - Description - Normal. Axillary  General Axillary Region: Bilateral - Description - Normal. Tenderness - Non Tender. Femoral & Inguinal  Generalized Femoral & Inguinal Lymphatics: Bilateral - Description - No Generalized lymphadenopathy.    Assessment & Plan Stark Klein MD; 09/04/2017 12:27 AM) MALIGNANT NEOPLASM OF UPPER-OUTER QUADRANT OF LEFT FEMALE BREAST, UNSPECIFIED ESTROGEN RECEPTOR STATUS (C50.412) Impression: Patient has a new diagnosis of left breast cancer, cTis. She is a good candidate for breast conservation. I will set up a seed localized lumpectomy. Her receptors are pending. If they are positive she could be a candidate for the COMET trial, but she desires surgery.  She will receive XRT and probably antihormonal therapy (depending on receptors).  We will do this at the first available opportunity.  The surgical procedure was described to the patient. I discussed the incision type and location and that we would need radiology involved on with a wire or seed marker and/or sentinel node.  The risks and benefits of the procedure were described to the patient and she wishes to proceed.  We discussed the risks bleeding, infection, damage to other structures, need for further procedures/surgeries. We discussed the risk of seroma. The patient was advised if the area in the breast in cancer, we may need to go back to surgery for additional tissue to obtain negative margins or for a lymph node biopsy. The patient was  advised that these are the most common complications, but that others can occur as well. They were advised against taking aspirin or  other anti-inflammatory agents/blood thinners the week before surgery. Current Plans You are being scheduled for surgery- Our schedulers will call you.  You should hear from our office's scheduling department within 5 working days about the location, date, and time of surgery. We try to make accommodations for patient's preferences in scheduling surgery, but sometimes the OR schedule or the surgeon's schedule prevents [place] from making those accommodations.  If you have not heard from our office [phone]) in 5 working days, call the office and ask for your surgeon's nurse.  If you have other questions about your diagnosis, plan, or surgery, call the office and ask for your surgeon's nurse.  Advised patient to stop ASA, anticoagulant, blood thinners, and NSAIDs five days prior to surgery. Pt Education - [facility] breast cancer surgery: discussed with patient and provided information. Pt Education - [facility] Breast Cancer Information Given - [facility] "Breast Journey" Package   Signed by Stark Klein, MD (09/04/2017 12:28 AM)

## 2017-09-08 NOTE — Pre-Procedure Instructions (Signed)
Pt given Ensure and instructed to drink by 0715 day of surgery with teach back method. 

## 2017-09-09 ENCOUNTER — Ambulatory Visit
Admission: RE | Admit: 2017-09-09 | Discharge: 2017-09-09 | Disposition: A | Discharge status: Home / Self Care | Payer: Medicare Other | Source: Ambulatory Visit | Attending: General Surgery | Admitting: General Surgery

## 2017-09-09 DIAGNOSIS — C50412 Malignant neoplasm of upper-outer quadrant of left female breast: Secondary | ICD-10-CM

## 2017-09-10 ENCOUNTER — Encounter (HOSPITAL_BASED_OUTPATIENT_CLINIC_OR_DEPARTMENT_OTHER)
Admission: RE | Disposition: A | Discharge status: Home / Self Care | Payer: Self-pay | Source: Ambulatory Visit | Attending: General Surgery

## 2017-09-10 ENCOUNTER — Ambulatory Visit
Admission: RE | Admit: 2017-09-10 | Discharge: 2017-09-10 | Disposition: A | Discharge status: Home / Self Care | Payer: Medicare Other | Source: Ambulatory Visit | Attending: General Surgery | Admitting: General Surgery

## 2017-09-10 ENCOUNTER — Ambulatory Visit (HOSPITAL_BASED_OUTPATIENT_CLINIC_OR_DEPARTMENT_OTHER): Payer: Medicare Other | Admitting: Anesthesiology

## 2017-09-10 ENCOUNTER — Ambulatory Visit (HOSPITAL_BASED_OUTPATIENT_CLINIC_OR_DEPARTMENT_OTHER)
Admission: RE | Admit: 2017-09-10 | Discharge: 2017-09-10 | Disposition: A | Discharge status: Home / Self Care | Payer: Medicare Other | Source: Ambulatory Visit | Attending: General Surgery | Admitting: General Surgery

## 2017-09-10 ENCOUNTER — Other Ambulatory Visit: Payer: Self-pay

## 2017-09-10 ENCOUNTER — Encounter (HOSPITAL_BASED_OUTPATIENT_CLINIC_OR_DEPARTMENT_OTHER): Payer: Self-pay | Admitting: Anesthesiology

## 2017-09-10 DIAGNOSIS — C50412 Malignant neoplasm of upper-outer quadrant of left female breast: Secondary | ICD-10-CM | POA: Diagnosis present

## 2017-09-10 DIAGNOSIS — D0512 Intraductal carcinoma in situ of left breast: Secondary | ICD-10-CM | POA: Insufficient documentation

## 2017-09-10 DIAGNOSIS — I10 Essential (primary) hypertension: Secondary | ICD-10-CM | POA: Diagnosis not present

## 2017-09-10 HISTORY — PX: BREAST LUMPECTOMY WITH RADIOACTIVE SEED LOCALIZATION: SHX6424

## 2017-09-10 HISTORY — DX: Malignant (primary) neoplasm, unspecified: C80.1

## 2017-09-10 HISTORY — DX: Hyperlipidemia, unspecified: E78.5

## 2017-09-10 SURGERY — BREAST LUMPECTOMY WITH RADIOACTIVE SEED LOCALIZATION
Anesthesia: General | Site: Breast | Laterality: Left

## 2017-09-10 MED ORDER — GABAPENTIN 300 MG PO CAPS
ORAL_CAPSULE | ORAL | Status: AC
  Filled 2017-09-10: qty 1

## 2017-09-10 MED ORDER — CHLORHEXIDINE GLUCONATE CLOTH 2 % EX PADS: 6.0000 | MEDICATED_PAD | Freq: Once | CUTANEOUS | Status: DC

## 2017-09-10 MED ORDER — CEFAZOLIN SODIUM-DEXTROSE 2-4 GM/100ML-% IV SOLN
2.0000 g | INTRAVENOUS | Status: AC
  Administered 2017-09-10: 2 g via INTRAVENOUS

## 2017-09-10 MED ORDER — OXYCODONE HCL 5 MG/5ML PO SOLN: 5.0000 mg | Freq: Once | ORAL | Status: DC | PRN

## 2017-09-10 MED ORDER — FENTANYL CITRATE (PF) 100 MCG/2ML IJ SOLN
INTRAMUSCULAR | Status: AC
  Filled 2017-09-10: qty 2

## 2017-09-10 MED ORDER — ACETAMINOPHEN 500 MG PO TABS
1000.0000 mg | ORAL_TABLET | ORAL | Status: AC
  Administered 2017-09-10: 1000 mg via ORAL

## 2017-09-10 MED ORDER — MIDAZOLAM HCL 2 MG/2ML IJ SOLN
INTRAMUSCULAR | Status: AC
  Filled 2017-09-10: qty 2

## 2017-09-10 MED ORDER — FENTANYL CITRATE (PF) 100 MCG/2ML IJ SOLN
50.0000 ug | INTRAMUSCULAR | Status: AC | PRN
  Administered 2017-09-10: 100 ug via INTRAVENOUS
  Administered 2017-09-10 (×2): 50 ug via INTRAVENOUS

## 2017-09-10 MED ORDER — EPHEDRINE SULFATE 50 MG/ML IJ SOLN
INTRAMUSCULAR | Status: DC | PRN
  Administered 2017-09-10: 5 mg via INTRAVENOUS
  Administered 2017-09-10: 10 mg via INTRAVENOUS

## 2017-09-10 MED ORDER — DEXAMETHASONE SODIUM PHOSPHATE 4 MG/ML IJ SOLN
INTRAMUSCULAR | Status: DC | PRN
  Administered 2017-09-10: 10 mg via INTRAVENOUS

## 2017-09-10 MED ORDER — ONDANSETRON HCL 4 MG/2ML IJ SOLN
INTRAMUSCULAR | Status: DC | PRN
  Administered 2017-09-10: 4 mg via INTRAVENOUS

## 2017-09-10 MED ORDER — LIDOCAINE HCL (CARDIAC) PF 100 MG/5ML IV SOSY
PREFILLED_SYRINGE | INTRAVENOUS | Status: AC
  Filled 2017-09-10: qty 5

## 2017-09-10 MED ORDER — PROPOFOL 500 MG/50ML IV EMUL
INTRAVENOUS | Status: AC
  Filled 2017-09-10: qty 50

## 2017-09-10 MED ORDER — ACETAMINOPHEN 500 MG PO TABS
ORAL_TABLET | ORAL | Status: AC
  Filled 2017-09-10: qty 2

## 2017-09-10 MED ORDER — ONDANSETRON HCL 4 MG/2ML IJ SOLN
INTRAMUSCULAR | Status: AC
  Filled 2017-09-10: qty 2

## 2017-09-10 MED ORDER — GLYCOPYRROLATE 0.2 MG/ML IJ SOLN
INTRAMUSCULAR | Status: DC | PRN
  Administered 2017-09-10: 0.2 mg via INTRAVENOUS

## 2017-09-10 MED ORDER — GABAPENTIN 300 MG PO CAPS
300.0000 mg | ORAL_CAPSULE | ORAL | Status: AC
  Administered 2017-09-10: 300 mg via ORAL

## 2017-09-10 MED ORDER — PROPOFOL 10 MG/ML IV BOLUS
INTRAVENOUS | Status: DC | PRN
  Administered 2017-09-10: 200 mg via INTRAVENOUS

## 2017-09-10 MED ORDER — LACTATED RINGERS IV SOLN
INTRAVENOUS | Status: DC
  Administered 2017-09-10 (×2): via INTRAVENOUS

## 2017-09-10 MED ORDER — DEXAMETHASONE SODIUM PHOSPHATE 10 MG/ML IJ SOLN
INTRAMUSCULAR | Status: AC
  Filled 2017-09-10: qty 1

## 2017-09-10 MED ORDER — HYDROMORPHONE HCL 1 MG/ML IJ SOLN: 0.2500 mg | INTRAMUSCULAR | Status: DC | PRN

## 2017-09-10 MED ORDER — CEFAZOLIN SODIUM-DEXTROSE 2-4 GM/100ML-% IV SOLN
INTRAVENOUS | Status: AC
  Filled 2017-09-10: qty 100

## 2017-09-10 MED ORDER — MIDAZOLAM HCL 2 MG/2ML IJ SOLN
1.0000 mg | INTRAMUSCULAR | Status: DC | PRN
  Administered 2017-09-10: 2 mg via INTRAVENOUS

## 2017-09-10 MED ORDER — SCOPOLAMINE 1 MG/3DAYS TD PT72: 1.0000 | MEDICATED_PATCH | Freq: Once | TRANSDERMAL | Status: DC | PRN

## 2017-09-10 MED ORDER — LIDOCAINE HCL 1 % IJ SOLN
INTRAMUSCULAR | Status: DC | PRN
  Administered 2017-09-10: 20 mL via INTRAMUSCULAR

## 2017-09-10 MED ORDER — OXYCODONE HCL 5 MG PO TABS: 5.0000 mg | ORAL_TABLET | Freq: Once | ORAL | Status: DC | PRN

## 2017-09-10 MED ORDER — OXYCODONE HCL 5 MG PO TABS: 5.0000 mg | ORAL_TABLET | Freq: Four times a day (QID) | ORAL | 0 refills | Status: DC | PRN

## 2017-09-10 MED ORDER — LIDOCAINE HCL (CARDIAC) PF 100 MG/5ML IV SOSY
PREFILLED_SYRINGE | INTRAVENOUS | Status: DC | PRN
  Administered 2017-09-10: 60 mg via INTRAVENOUS

## 2017-09-10 SURGICAL SUPPLY — 52 items
BINDER BREAST LRG (GAUZE/BANDAGES/DRESSINGS) IMPLANT
BINDER BREAST MEDIUM (GAUZE/BANDAGES/DRESSINGS) IMPLANT
BINDER BREAST XLRG (GAUZE/BANDAGES/DRESSINGS) IMPLANT
BINDER BREAST XXLRG (GAUZE/BANDAGES/DRESSINGS) ×2 IMPLANT
BLADE SURG 10 STRL SS (BLADE) ×2 IMPLANT
BLADE SURG 15 STRL LF DISP TIS (BLADE) IMPLANT
BLADE SURG 15 STRL SS (BLADE)
CANISTER SUC SOCK COL 7IN (MISCELLANEOUS) IMPLANT
CANISTER SUCT 1200ML W/VALVE (MISCELLANEOUS) IMPLANT
CHLORAPREP W/TINT 26ML (MISCELLANEOUS) ×2 IMPLANT
CLIP VESOCCLUDE LG 6/CT (CLIP) ×2 IMPLANT
CLIP VESOCCLUDE MED 6/CT (CLIP) IMPLANT
COVER BACK TABLE 60X90IN (DRAPES) ×2 IMPLANT
COVER MAYO STAND STRL (DRAPES) ×2 IMPLANT
COVER PROBE W GEL 5X96 (DRAPES) ×2 IMPLANT
DECANTER SPIKE VIAL GLASS SM (MISCELLANEOUS) IMPLANT
DERMABOND ADVANCED (GAUZE/BANDAGES/DRESSINGS) ×1
DERMABOND ADVANCED .7 DNX12 (GAUZE/BANDAGES/DRESSINGS) ×1 IMPLANT
DEVICE DUBIN W/COMP PLATE 8390 (MISCELLANEOUS) ×2 IMPLANT
DRAPE LAPAROSCOPIC ABDOMINAL (DRAPES) ×2 IMPLANT
DRAPE UTILITY XL STRL (DRAPES) ×2 IMPLANT
ELECT COATED BLADE 2.86 ST (ELECTRODE) ×2 IMPLANT
ELECT REM PT RETURN 9FT ADLT (ELECTROSURGICAL) ×2
ELECTRODE REM PT RTRN 9FT ADLT (ELECTROSURGICAL) ×1 IMPLANT
GAUZE SPONGE 4X4 12PLY STRL LF (GAUZE/BANDAGES/DRESSINGS) ×2 IMPLANT
GLOVE BIO SURGEON STRL SZ 6 (GLOVE) ×2 IMPLANT
GLOVE BIOGEL PI IND STRL 6.5 (GLOVE) ×1 IMPLANT
GLOVE BIOGEL PI INDICATOR 6.5 (GLOVE) ×1
GOWN STRL REUS W/ TWL LRG LVL3 (GOWN DISPOSABLE) ×1 IMPLANT
GOWN STRL REUS W/TWL 2XL LVL3 (GOWN DISPOSABLE) ×2 IMPLANT
GOWN STRL REUS W/TWL LRG LVL3 (GOWN DISPOSABLE) ×1
KIT MARKER MARGIN INK (KITS) ×2 IMPLANT
LIGHT WAVEGUIDE WIDE FLAT (MISCELLANEOUS) IMPLANT
NEEDLE HYPO 25X1 1.5 SAFETY (NEEDLE) ×2 IMPLANT
NS IRRIG 1000ML POUR BTL (IV SOLUTION) ×2 IMPLANT
PACK BASIN DAY SURGERY FS (CUSTOM PROCEDURE TRAY) ×2 IMPLANT
PENCIL BUTTON HOLSTER BLD 10FT (ELECTRODE) ×2 IMPLANT
SLEEVE SCD COMPRESS KNEE MED (MISCELLANEOUS) ×2 IMPLANT
SPONGE LAP 18X18 RF (DISPOSABLE) ×2 IMPLANT
STRIP CLOSURE SKIN 1/2X4 (GAUZE/BANDAGES/DRESSINGS) ×2 IMPLANT
SUT MNCRL AB 4-0 PS2 18 (SUTURE) ×2 IMPLANT
SUT MON AB 5-0 PS2 18 (SUTURE) IMPLANT
SUT SILK 2 0 SH (SUTURE) IMPLANT
SUT VIC AB 2-0 SH 27 (SUTURE) ×1
SUT VIC AB 2-0 SH 27XBRD (SUTURE) ×1 IMPLANT
SUT VIC AB 3-0 SH 27 (SUTURE) ×1
SUT VIC AB 3-0 SH 27X BRD (SUTURE) ×1 IMPLANT
SYR CONTROL 10ML LL (SYRINGE) ×2 IMPLANT
TOWEL OR 17X24 6PK STRL BLUE (TOWEL DISPOSABLE) ×2 IMPLANT
TOWEL OR NON WOVEN STRL DISP B (DISPOSABLE) IMPLANT
TUBE CONNECTING 20X1/4 (TUBING) IMPLANT
YANKAUER SUCT BULB TIP NO VENT (SUCTIONS) IMPLANT

## 2017-09-10 NOTE — Anesthesia Postprocedure Evaluation (Signed)
Anesthesia Post Note  Patient: Victoria Fuller  Procedure(s) Performed: BREAST LUMPECTOMY WITH RADIOACTIVE SEED LOCALIZATION (Left Breast)     Patient location during evaluation: PACU Anesthesia Type: General Level of consciousness: awake and alert Pain management: pain level controlled Vital Signs Assessment: post-procedure vital signs reviewed and stable Respiratory status: spontaneous breathing, nonlabored ventilation, respiratory function stable and patient connected to nasal cannula oxygen Cardiovascular status: blood pressure returned to baseline and stable Postop Assessment: no apparent nausea or vomiting Anesthetic complications: no    Last Vitals:  Vitals:   09/10/17 1230 09/10/17 1303  BP: (!) 142/92 (!) 144/84  Pulse: 88 72  Resp: (!) 22 20  Temp:  36.6 C  SpO2: 100% 100%    Last Pain:  Vitals:   09/10/17 1303  TempSrc: Oral  PainSc: 0-No pain                 Dharma Pare P Martiza Speth

## 2017-09-10 NOTE — Anesthesia Preprocedure Evaluation (Addendum)
Anesthesia Evaluation  Patient identified by MRN, date of birth, ID band Patient awake    Reviewed: Allergy & Precautions, NPO status , Patient's Chart, lab work & pertinent test results  Airway Mallampati: II  TM Distance: >3 FB Neck ROM: Full    Dental no notable dental hx.    Pulmonary neg pulmonary ROS,    Pulmonary exam normal breath sounds clear to auscultation       Cardiovascular hypertension, Pt. on medications Normal cardiovascular exam Rhythm:Regular Rate:Normal  ECG: NSR, rate 71   Neuro/Psych negative neurological ROS  negative psych ROS   GI/Hepatic negative GI ROS, Neg liver ROS,   Endo/Other  negative endocrine ROS  Renal/GU negative Renal ROS     Musculoskeletal negative musculoskeletal ROS (+)   Abdominal   Peds  Hematology HLD   Anesthesia Other Findings LEFT BREAST CANCER  Reproductive/Obstetrics                            Anesthesia Physical Anesthesia Plan  ASA: II  Anesthesia Plan: General   Post-op Pain Management:    Induction: Intravenous  PONV Risk Score and Plan: 3 and Midazolam, Dexamethasone, Ondansetron and Treatment may vary due to age or medical condition  Airway Management Planned: LMA  Additional Equipment:   Intra-op Plan:   Post-operative Plan: Extubation in OR  Informed Consent: I have reviewed the patients History and Physical, chart, labs and discussed the procedure including the risks, benefits and alternatives for the proposed anesthesia with the patient or authorized representative who has indicated his/her understanding and acceptance.   Dental advisory given  Plan Discussed with: CRNA  Anesthesia Plan Comments:         Anesthesia Quick Evaluation

## 2017-09-10 NOTE — Anesthesia Procedure Notes (Signed)
Procedure Name: LMA Insertion Date/Time: 09/10/2017 11:11 AM Performed by: Marrianne Mood, CRNA Pre-anesthesia Checklist: Patient identified, Emergency Drugs available, Suction available and Patient being monitored Patient Re-evaluated:Patient Re-evaluated prior to induction Oxygen Delivery Method: Circle system utilized Preoxygenation: Pre-oxygenation with 100% oxygen Induction Type: IV induction Ventilation: Mask ventilation without difficulty LMA: LMA inserted LMA Size: 4.0 Number of attempts: 1 Airway Equipment and Method: Bite block Placement Confirmation: positive ETCO2 Tube secured with: Tape Dental Injury: Teeth and Oropharynx as per pre-operative assessment

## 2017-09-10 NOTE — Transfer of Care (Signed)
Immediate Anesthesia Transfer of Care Note  Patient: Victoria Fuller  Procedure(s) Performed: BREAST LUMPECTOMY WITH RADIOACTIVE SEED LOCALIZATION (Left Breast)  Patient Location: PACU  Anesthesia Type:General  Level of Consciousness: sedated  Airway & Oxygen Therapy: Patient Spontanous Breathing and Patient connected to face mask oxygen  Post-op Assessment: Report given to RN and Post -op Vital signs reviewed and stable  Post vital signs: Reviewed and stable  Last Vitals:  Vitals Value Taken Time  BP    Temp    Pulse    Resp    SpO2      Last Pain:  Vitals:   09/10/17 0943  TempSrc: Oral  PainSc: 0-No pain         Complications: No apparent anesthesia complications

## 2017-09-10 NOTE — Interval H&P Note (Signed)
History and Physical Interval Note:  09/10/2017 10:37 AM  Victoria Fuller  has presented today for surgery, with the diagnosis of LEFT BREAST CANCER  The various methods of treatment have been discussed with the patient and family. After consideration of risks, benefits and other options for treatment, the patient has consented to  Procedure(s): BREAST LUMPECTOMY WITH RADIOACTIVE SEED LOCALIZATION (Left) as a surgical intervention .  The patient's history has been reviewed, patient examined, no change in status, stable for surgery.  I have reviewed the patient's chart and labs.  Questions were answered to the patient's satisfaction.     Stark Klein

## 2017-09-10 NOTE — Discharge Instructions (Addendum)
No Tylenol before 4 pm!     [employer] Phone Number [phone]  BREAST BIOPSY/ PARTIAL MASTECTOMY: POST OP INSTRUCTIONS  Always review your discharge instruction sheet given to you by the facility where your surgery was performed.  IF YOU HAVE DISABILITY OR FAMILY LEAVE FORMS, YOU MUST BRING THEM TO THE OFFICE FOR PROCESSING.  DO NOT GIVE THEM TO YOUR DOCTOR.  1. A prescription for pain medication may be given to you upon discharge.  Take your pain medication as prescribed, if needed.  If narcotic pain medicine is not needed, then you may take acetaminophen (Tylenol) or ibuprofen (Advil) as needed. 2. Take your usually prescribed medications unless otherwise directed 3. If you need a refill on your pain medication, please contact your pharmacy.  They will contact our office to request authorization.  Prescriptions will not be filled after 5pm or on week-ends. 4. You should eat very light the first 24 hours after surgery, such as soup, crackers, pudding, etc.  Resume your normal diet the day after surgery. 5. Most patients will experience some swelling and bruising in the breast.  Ice packs and a good support bra will help.  Swelling and bruising can take several days to resolve.  6. It is common to experience some constipation if taking pain medication after surgery.  Increasing fluid intake and taking a stool softener will usually help or prevent this problem from occurring.  A mild laxative (Milk of Magnesia or Miralax) should be taken according to package directions if there are no bowel movements after 48 hours. 7. Unless discharge instructions indicate otherwise, you may remove your bandages 48 hours after surgery, and you may shower at that time.  You may have steri-strips (small skin tapes) in place directly over the incision.  These strips should be left on the skin for 7-10 days.   Any sutures or staples will be removed at the office during your follow-up  visit. 8. ACTIVITIES:  You may resume regular daily activities (gradually increasing) beginning the next day.  Wearing a good support bra or sports bra (or the breast binder) minimizes pain and swelling.  You may have sexual intercourse when it is comfortable. a. You may drive when you no longer are taking prescription pain medication, you can comfortably wear a seatbelt, and you can safely maneuver your car and apply brakes. b. RETURN TO WORK:  __________1 week_______________ 9. You should see your doctor in the office for a follow-up appointment approximately two weeks after your surgery.  Your doctors nurse will typically make your follow-up appointment when she calls you with your pathology report.  Expect your pathology report 2-3 business days after your surgery.  You may call to check if you do not hear from [place] after three days.   WHEN TO CALL YOUR DOCTOR: 1. Fever over 101.0 2. Nausea and/or vomiting. 3. Extreme swelling or bruising. 4. Continued bleeding from incision. 5. Increased pain, redness, or drainage from the incision.  The clinic staff is available to answer your questions during regular business hours.  Please dont hesitate to call and ask to speak to one of the nurses for clinical concerns.  If you have a medical emergency, go to the nearest emergency room or call 911.  A surgeon from [facility] is always on call at the hospital.  For further questions, please visit [email or web address]        Post Anesthesia Home Care Instructions  Activity: Get plenty of rest for the  remainder of the day. A responsible individual must stay with you for 24 hours following the procedure.  For the next 24 hours, DO NOT: -Drive a car -Paediatric nurse -Drink alcoholic beverages -Take any medication unless instructed by your physician -Make any legal decisions or sign important papers.  Meals: Start with liquid foods such as gelatin or soup. Progress to  regular foods as tolerated. Avoid greasy, spicy, heavy foods. If nausea and/or vomiting occur, drink only clear liquids until the nausea and/or vomiting subsides. Call your physician if vomiting continues.  Special Instructions/Symptoms: Your throat may feel dry or sore from the anesthesia or the breathing tube placed in your throat during surgery. If this causes discomfort, gargle with warm salt water. The discomfort should disappear within 24 hours.  If you had a scopolamine patch placed behind your ear for the management of post- operative nausea and/or vomiting:  1. The medication in the patch is effective for 72 hours, after which it should be removed.  Wrap patch in a tissue and discard in the trash. Wash hands thoroughly with soap and water. 2. You may remove the patch earlier than 72 hours if you experience unpleasant side effects which may include dry mouth, dizziness or visual disturbances. 3. Avoid touching the patch. Wash your hands with soap and water after contact with the patch.

## 2017-09-10 NOTE — Op Note (Signed)
Left Breast Radioactive seed localized lumpectomy  Indications: This patient presents with history of left breast cancer, cTis, upper outer quadrant, ER/PR positive  Pre-operative Diagnosis: left breast cancer  Post-operative Diagnosis: Same  Surgeon: Stark Klein   Anesthesia: General endotracheal anesthesia  ASA Class: 2  Procedure Details  The patient was seen in the Holding Room. The risks, benefits, complications, treatment options, and expected outcomes were discussed with the patient. The possibilities of bleeding, infection, the need for additional procedures, failure to diagnose a condition, and creating a complication requiring transfusion or operation were discussed with the patient. The patient concurred with the proposed plan, giving informed consent.  The site of surgery properly noted/marked. The patient was taken to Operating Room # 8, identified, and the procedure verified as left Breast Seed Localized Lumpectomy. A Time Out was held and the above information confirmed.  The left breast and chest were prepped and draped in standard fashion. The lumpectomy was performed by creating a lateral circumareolar incision near the previously placed radioactive seed.  Dissection was carried down to around the point of maximum signal intensity. The cautery was used to perform the dissection.  Hemostasis was achieved with cautery. The edges of the cavity were marked with large clips, with one each medial, lateral, inferior and superior, and two clips posteriorly.   The specimen was inked with the margin marker paint kit.    Specimen radiography confirmed inclusion of the mammographic lesion, the clip, and the seed.  The background signal in the breast was zero.  The wound was irrigated and closed with 3-0 vicryl in layers and 4-0 monocryl subcuticular suture.      Sterile dressings were applied. At the end of the operation, all sponge, instrument, and needle counts were  correct.  Findings: grossly clear surgical margins and no adenopathy  Estimated Blood Loss:  min         Specimens: left breast lumpectomy and seed         Complications:  None; patient tolerated the procedure well.         Disposition: PACU - hemodynamically stable.         Condition: stable

## 2017-09-11 ENCOUNTER — Telehealth: Payer: Self-pay | Admitting: *Deleted

## 2017-09-11 ENCOUNTER — Encounter (HOSPITAL_BASED_OUTPATIENT_CLINIC_OR_DEPARTMENT_OTHER): Payer: Self-pay | Admitting: General Surgery

## 2017-09-11 DIAGNOSIS — D0512 Intraductal carcinoma in situ of left breast: Secondary | ICD-10-CM

## 2017-09-11 NOTE — Telephone Encounter (Signed)
Spoke to pt concerning [place] from 5.8.19. Denies questions or concerns regarding dx or treatment care plan. Relate "doing well" after sx. No pain. Informed pt next step is xrt and will receive call from Dr. Ida Rogue office with an appt. To see Dr. Burr Medico after xrt. Received verbal understanding. Contact information provided for questions or needs

## 2017-09-12 ENCOUNTER — Encounter: Payer: Self-pay | Admitting: Radiation Oncology

## 2017-09-12 NOTE — Progress Notes (Signed)
Please let patient know No invasive cancer is present and margins are all negative, so no additional surgery needed.

## 2017-09-18 NOTE — Progress Notes (Signed)
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 - DUCTAL PAPILLOMA. - SEE MICROSCOPIC DESCRIPTION.   Receptor Status: ER(100 % +), PR (40 % +), Her2-neu (), Ki-()  Did patient present with symptoms (if so, please note symptoms) or was this found on screening mammography?: screening detected calcifications  Past/Anticipated interventions by surgeon, if any:  Diagnosis 09-10-17 Dr. Stark Klein Breast, lumpectomy - DUCTAL CARCINOMA IN SITU, INTERMEDIATE NUCLEAR GRADE, WITH CALCIFICATIONS - MARGINS UNINVOLVED BY CARCINOMA - DUCT ECTASIA AND PERIDUCTULAR CHRONIC INFLAMMATION - FIBROADENOMATOID NODULE - PREVIOUS BIOPSY SITE CHANGES - SEE ONCOLOGY TABLE BELOW  Receptor Status: ER(100 % +), PR (40 %+), Her2-neu (), Ki-()   Past/Anticipated interventions by medical oncology, if any: Dr. Burr Medico  Chemotherapy No  candidate for the COMET Study  Adjuvant Radiation  She is candidate for genetic testing due to her personal history of cancer and her family history of cancer. She would like to think about it. I discussed that if she had a gene mutation such as BRCA1 or BRCA2, we may recommend her to have mastectomy.    Adjuvant endocrine therapy for a total of 5 years   Lymphedema issues, if any:  No  Rom to left good arm. Skin to left breast healing without signs of infection to the incision area steri strips intact.    Follow up appointment with Dr.Faera [facility] Friday 09-26-17.  Pain issues, if any: None  SAFETY ISSUES:  Prior radiation? :No  Pacemaker/ICD? :No  Possible current pregnancy?:No  Is the patient on methotrexate? 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

## 2017-09-24 ENCOUNTER — Encounter: Payer: Self-pay | Admitting: Radiation Oncology

## 2017-09-24 ENCOUNTER — Ambulatory Visit
Admission: RE | Admit: 2017-09-24 | Discharge: 2017-09-24 | Disposition: A | Discharge status: Home / Self Care | Payer: Medicare Other | Source: Ambulatory Visit | Attending: Radiation Oncology | Admitting: Radiation Oncology

## 2017-09-24 ENCOUNTER — Other Ambulatory Visit: Payer: Self-pay

## 2017-09-24 VITALS — BP 132/88 | HR 82 | Temp 98.7°F | Resp 18 | Ht 68.0 in | Wt 182.6 lb

## 2017-09-24 DIAGNOSIS — Z79899 Other long term (current) drug therapy: Secondary | ICD-10-CM | POA: Diagnosis not present

## 2017-09-24 DIAGNOSIS — Z170 Estrogen receptor positive status [ER+]: Secondary | ICD-10-CM | POA: Diagnosis not present

## 2017-09-24 DIAGNOSIS — D0512 Intraductal carcinoma in situ of left breast: Secondary | ICD-10-CM

## 2017-09-24 DIAGNOSIS — I10 Essential (primary) hypertension: Secondary | ICD-10-CM | POA: Diagnosis not present

## 2017-09-24 DIAGNOSIS — E785 Hyperlipidemia, unspecified: Secondary | ICD-10-CM | POA: Diagnosis not present

## 2017-09-24 DIAGNOSIS — Z7982 Long term (current) use of aspirin: Secondary | ICD-10-CM | POA: Insufficient documentation

## 2017-09-24 DIAGNOSIS — D512 Transcobalamin II deficiency: Secondary | ICD-10-CM | POA: Diagnosis not present

## 2017-09-24 NOTE — Progress Notes (Signed)
Radiation Oncology         [phone] ________________________________  Name: Victoria Fuller        MRN: [id number]  Date of Service: 09/24/2017 DOB: 1950-06-28  CC:Sun, Gari Crown, MD  Truitt Merle, MD     REFERRING PHYSICIAN: Truitt Merle, MD   DIAGNOSIS: The encounter diagnosis was Ductal carcinoma in situ (DCIS) of left breast.   HISTORY OF PRESENT ILLNESS: Victoria Fuller is a 67 y.o. female who was originally seen in the multidisciplinary breast clinic for a new diagnosis of left breast cancer. The patient was noted to have screening detected calcifications. She underwent diagnostic imaging and this revealed a 4 mm area of calcifications on mammography, and underwent stereotactic biopsy on 08/25/17. Final pathology revealed DCIS, intermediate grade with calcifications, her ER/PR testing was pending at her last visit but both were positive. She underwent left lumpectomy on 09/10/17 and this revealed an intermediate grade DCIS with calcifications. Her margins were negative for carcinoma, and she had a fibroadenomatous nodule. She comes today to discuss adjuvant radiotherapy.  PREVIOUS RADIATION THERAPY: No   PAST MEDICAL HISTORY:  Past Medical History:  Diagnosis Date  . Cancer ([place]) 08/2017   Left breast cancer  . Hyperlipidemia   . Hypertension        PAST SURGICAL HISTORY: Past Surgical History:  Procedure Laterality Date  . ABDOMINAL HYSTERECTOMY    . BREAST LUMPECTOMY WITH RADIOACTIVE SEED LOCALIZATION Left 09/10/2017   Procedure: BREAST LUMPECTOMY WITH RADIOACTIVE SEED LOCALIZATION;  Surgeon: Stark Klein, MD;  Location: [facility];  Service: General;  Laterality: Left;  . DIAGNOSTIC LAPAROSCOPY     uterine polyps     FAMILY HISTORY:  Family History  Problem Relation Age of Onset  . Cancer Mother 79       ovarian cancer      SOCIAL HISTORY:  reports that she has never smoked. She has never used smokeless tobacco. She reports that she drank alcohol. She  reports that she does not use drugs. The patient is married and lives in [place]. 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   ALLERGIES: Patient has no known allergies.   MEDICATIONS:  Current Outpatient Medications  Medication Sig Dispense Refill  . amLODipine (NORVASC) 5 MG tablet Take 5 mg by mouth daily.    Marland Kitchen aspirin 81 MG chewable tablet Chew 81 mg by mouth daily.    Marland Kitchen atorvastatin (LIPITOR) 10 MG tablet Take 10 mg by mouth daily.    . hydrochlorothiazide (HYDRODIURIL) 25 MG tablet Take 25 mg by mouth daily.    . Nutritional Supplements (JUICE PLUS FIBRE PO) Take by mouth.     No current facility-administered medications for this encounter.      REVIEW OF SYSTEMS: On review of systems, the patient reports that she is doing well overall. She has not taken any pain medication and reports she's feeling great. She's back to exercising regularly. She denies any chest pain, shortness of breath, cough, fevers, chills, night sweats, unintended weight changes. She denies any bowel or bladder disturbances, and denies abdominal pain, nausea or vomiting. She denies any new musculoskeletal or joint aches or pains. A complete review of systems is obtained and is otherwise negative.     PHYSICAL EXAM:  Wt Readings from Last 3 Encounters:  09/24/17 182 lb 9.6 oz (82.8 kg)  09/10/17 183 lb 9.6 oz (83.3 kg)  09/03/17 180 lb 8 oz (81.9 kg)   Temp Readings from  Last 3 Encounters:  09/24/17 98.7 F (37.1 C) (Oral)  09/10/17 97.8 F (36.6 C) (Oral)  09/03/17 98.3 F (36.8 C) (Oral)   BP Readings from Last 3 Encounters:  09/24/17 132/88  09/10/17 (!) 144/84  09/03/17 131/81   Pulse Readings from Last 3 Encounters:  09/24/17 82  09/10/17 72  09/03/17 75     In general this is a well appearing African American female in no acute distress. She is alert and oriented x4 and appropriate throughout the examination. HEENT reveals that the  patient is normocephalic, atraumatic. EOMs are intact. Skin is intact without any evidence of gross lesions. Cardiopulmonary assessment is negative for acute distress and she exhibits normal effort. The left breast incision site is intact with mild induration deep to the surgical site without erythema, warmth, or pain.    ECOG = 0  0 - Asymptomatic (Fully active, able to carry on all predisease activities without restriction)  1 - Symptomatic but completely ambulatory (Restricted in physically strenuous activity but ambulatory and able to carry out work of a light or sedentary nature. For example, light housework, office work)  2 - Symptomatic, <50% in bed during the day (Ambulatory and capable of all self care but unable to carry out any work activities. Up and about more than 50% of waking hours)  3 - Symptomatic, >50% in bed, but not bedbound (Capable of only limited self-care, confined to bed or chair 50% or more of waking hours)  4 - Bedbound (Completely disabled. Cannot carry on any self-care. Totally confined to bed or chair)  5 - Death   Oken MM, Creech RH, Tormey DC, et al. (1982). "Toxicity and response criteria of the Eastern Cooperative Oncology Group". Am. J. Clin. Oncol. 5 (6): 649-55    LABORATORY DATA:  Lab Results  Component Value Date   WBC 3.4 (L) 09/03/2017   HGB 13.5 09/03/2017   HCT 40.1 09/03/2017   MCV 86.8 09/03/2017   PLT 177 09/03/2017   Lab Results  Component Value Date   NA 142 09/03/2017   K 3.2 (L) 09/03/2017   CL 102 09/03/2017   CO2 32 (H) 09/03/2017   Lab Results  Component Value Date   ALT 40 09/03/2017   AST 22 09/03/2017   ALKPHOS 73 09/03/2017   BILITOT 1.9 (H) 09/03/2017      RADIOGRAPHY: Mm Breast Surgical Specimen  Result Date: 09/10/2017 CLINICAL DATA:  Evaluate specimen EXAM: SPECIMEN RADIOGRAPH OF THE LEFT BREAST COMPARISON:  Previous exam(s). FINDINGS: Status post excision of the left breast. The radioactive seed and biopsy  marker clip are present, completely intact, and were marked for pathology. IMPRESSION: Specimen radiograph of the left breast. Electronically Signed   By: David  Williams III M.D   On: 09/10/2017 11:39   Mm Lt Radioactive Seed Loc Mammo Guide  Result Date: 09/09/2017 CLINICAL DATA:  66-year-old female presenting for radioactive seed localization prior to left breast lumpectomy. EXAM: MAMMOGRAPHIC GUIDED RADIOACTIVE SEED LOCALIZATION OF THE LEFT BREAST COMPARISON:  Previous exam(s). FINDINGS: Patient presents for radioactive seed localization prior to left breast lumpectomy. I met with the patient and we discussed the procedure of seed localization including benefits and alternatives. We discussed the high likelihood of a successful procedure. We discussed the risks of the procedure including infection, bleeding, tissue injury and further surgery. We discussed the low dose of radioactivity involved in the procedure. Informed, written consent was given. The usual time-out protocol was performed immediately prior to the procedure. Using   mammographic guidance, sterile technique, 1% lidocaine and an I-125 radioactive seed, the biopsy marking clip in the upper-outer quadrant of the left breast was localized using a lateral approach. The follow-up mammogram images confirm the seed in the expected location and were marked for Dr. Barry Dienes. Follow-up survey of the patient confirms presence of the radioactive seed. Order number of I-125 seed:  [id number]. Total activity:  5.790 millicuries reference Date: 08/22/2017 The patient tolerated the procedure well and was released from the [facility]. She was given instructions regarding seed removal. IMPRESSION: Radioactive seed localization left breast. No apparent complications. Electronically Signed   By: Ammie Ferrier M.D.   On: 09/09/2017 14:51       IMPRESSION/PLAN: 1. ER/PR positive DCIS of the left breast. Dr. Lisbeth Renshaw discusses the final pathology findings and  reviews the nature of non invasive breast disease. She is healing well and ready to proceed with external radiotherapy to the breast followed by antiestrogen therapy. We discussed the risks, benefits, short, and long term effects of radiotherapy, and the patient is interested in proceeding. Dr. Lisbeth Renshaw discusses the delivery and logistics of radiotherapy and anticipates a course of 4 weeks of radiotherapy with deep inspiration breath hold technique. Written consent is obtained and placed in the chart, a copy was provided to the patient. She is interested in simulation on Friday this week, and we anticipate starting on 10/08/17.  In a visit lasting 25 minutes, greater than 50% of the time was spent face to face discussing her case, and coordinating the patient's care.   The above documentation reflects my direct findings during this shared patient visit. Please see the separate note by Dr. Lisbeth Renshaw on this date for the remainder of the patient's plan of care.    Carola Rhine, PAC

## 2017-09-26 ENCOUNTER — Ambulatory Visit
Admission: RE | Admit: 2017-09-26 | Discharge: 2017-09-26 | Disposition: A | Discharge status: Home / Self Care | Payer: Medicare Other | Source: Ambulatory Visit | Attending: Radiation Oncology | Admitting: Radiation Oncology

## 2017-09-26 DIAGNOSIS — Z510 Encounter for antineoplastic radiation therapy: Secondary | ICD-10-CM | POA: Insufficient documentation

## 2017-09-26 DIAGNOSIS — D0512 Intraductal carcinoma in situ of left breast: Secondary | ICD-10-CM | POA: Diagnosis present

## 2017-10-02 DIAGNOSIS — Z510 Encounter for antineoplastic radiation therapy: Secondary | ICD-10-CM | POA: Diagnosis present

## 2017-10-02 DIAGNOSIS — D0512 Intraductal carcinoma in situ of left breast: Secondary | ICD-10-CM | POA: Diagnosis not present

## 2017-10-08 ENCOUNTER — Ambulatory Visit
Admission: RE | Admit: 2017-10-08 | Discharge: 2017-10-08 | Disposition: A | Discharge status: Home / Self Care | Payer: Medicare Other | Source: Ambulatory Visit | Attending: Radiation Oncology | Admitting: Radiation Oncology

## 2017-10-08 DIAGNOSIS — Z510 Encounter for antineoplastic radiation therapy: Secondary | ICD-10-CM | POA: Diagnosis not present

## 2017-10-08 DIAGNOSIS — D0512 Intraductal carcinoma in situ of left breast: Secondary | ICD-10-CM

## 2017-10-08 MED ORDER — ALRA NON-METALLIC DEODORANT (RAD-ONC)
1.0000 "application " | Freq: Once | TOPICAL | Status: AC
  Administered 2017-10-08: 1 via TOPICAL

## 2017-10-08 MED ORDER — RADIAPLEXRX EX GEL
Freq: Once | CUTANEOUS | Status: AC
  Administered 2017-10-08: 12:00:00 via TOPICAL

## 2017-10-08 NOTE — Progress Notes (Signed)
Pt here for patient teaching.  Pt given Radiation and You booklet, skin care instructions, Alra deodorant and Radiaplex gel.  Reviewed areas of pertinence such as fatigue, hair loss, skin changes, breast tenderness and breast swelling . Pt able to give teach back of to pat skin and use unscented/gentle soap,apply Radiaplex bid, avoid applying anything to skin within 4 hours of treatment, avoid wearing an under wire bra and to use an electric razor if they must shave. Pt demonstrated understanding, needs reinforcement, no evidence of learning, refused teaching and  of information given and will contact nursing with any questions or concerns.     Http://rtanswers.org/treatmentinformation/whattoexpect/index      

## 2017-10-09 ENCOUNTER — Ambulatory Visit
Admission: RE | Admit: 2017-10-09 | Discharge: 2017-10-09 | Disposition: A | Discharge status: Home / Self Care | Payer: Medicare Other | Source: Ambulatory Visit | Attending: Radiation Oncology | Admitting: Radiation Oncology

## 2017-10-09 DIAGNOSIS — Z510 Encounter for antineoplastic radiation therapy: Secondary | ICD-10-CM | POA: Diagnosis not present

## 2017-10-10 ENCOUNTER — Ambulatory Visit
Admission: RE | Admit: 2017-10-10 | Discharge: 2017-10-10 | Disposition: A | Discharge status: Home / Self Care | Payer: Medicare Other | Source: Ambulatory Visit | Attending: Radiation Oncology | Admitting: Radiation Oncology

## 2017-10-10 DIAGNOSIS — Z510 Encounter for antineoplastic radiation therapy: Secondary | ICD-10-CM | POA: Diagnosis not present

## 2017-10-10 NOTE — Progress Notes (Signed)
  Radiation Oncology         [phone] ________________________________  Name: Victoria Fuller MRN: [id number]  Date: 09/26/2017  DOB: 07/19/1950  Optical Surface Tracking Plan:  Since intensity modulated radiotherapy (IMRT) and 3D conformal radiation treatment methods are predicated on accurate and precise positioning for treatment, intrafraction motion monitoring is medically necessary to ensure accurate and safe treatment delivery.  The ability to quantify intrafraction motion without excessive ionizing radiation dose can only be performed with optical surface tracking. Accordingly, surface imaging offers the opportunity to obtain 3D measurements of patient position throughout IMRT and 3D treatments without excessive radiation exposure.  I am ordering optical surface tracking for this patient's upcoming course of radiotherapy. ________________________________  Kyung Rudd, MD 10/10/2017 10:50 AM    Reference:   Ursula Alert, J, et al. Surface imaging-based analysis of intrafraction motion for breast radiotherapy patients.Journal of [place], n. 6, nov. 2014. ISSN [id number].   Available at: <http://www.jacmp.org/index.php/jacmp/article/view/4957>.

## 2017-10-10 NOTE — Progress Notes (Signed)
  Radiation Oncology         [phone] ________________________________  Name: Victoria Fuller MRN: [id number]  Date: 09/26/2017  DOB: 08/04/1950   DIAGNOSIS:     ICD-10-CM   1. Ductal carcinoma in situ (DCIS) of left breast D05.12     SIMULATION AND TREATMENT PLANNING NOTE  The patient presented for simulation prior to beginning her course of radiation treatment for her diagnosis of left-sided breast cancer. The patient was placed in a supine position on a breast board. A customized vac-lock bag was constructed and this complex treatment device will be used on a daily basis during her treatment. In this fashion, a CT scan was obtained through the chest area and an isocenter was placed near the chest wall within the breast.  The patient will be planned to receive a course of radiation initially to a dose of 42.56 Gy. This will consist of a whole breast radiotherapy technique. To accomplish this, 2 customized blocks have been designed which will correspond to medial and lateral whole breast tangent fields. This treatment will be accomplished at 2.66 Gy per fraction. A forward planning technique will also be evaluated to determine if this approach improves the plan. It is anticipated that the patient will then receive a 8 Gy boost to the seroma cavity which has been contoured. This will be accomplished at 2 Gy per fraction.   This initial treatment will consist of a 3-D conformal technique. The seroma has been contoured as the primary target structure. Additionally, dose volume histograms of both this target as well as the lungs and heart will also be evaluated. Such an approach is necessary to ensure that the target area is adequately covered while the nearby critical  normal structures are adequately spared.  Plan:  The final anticipated total dose therefore will correspond to 50.56 Gy.   Special treatment procedure was performed today due to the extra time and effort required by myself to  plan and prepare this patient for deep inspiration breath hold technique.  I have determined cardiac sparing to be of benefit to this patient to prevent long term cardiac damage due to radiation of the heart.  Bellows were placed on the patient's abdomen. To facilitate cardiac sparing, the patient was coached by the radiation therapists on breath hold techniques and breathing practice was performed. Practice waveforms were obtained. The patient was then scanned while maintaining breath hold in the treatment position.  This image was then transferred over to the imaging specialist. The imaging specialist then created a fusion of the free breathing and breath hold scans using the chest wall as the stable structure. I personally reviewed the fusion in axial, coronal and sagittal image planes.  Excellent cardiac sparing was obtained.  I felt the patient is an appropriate candidate for breath hold and the patient will be treated as such.  The image fusion was then reviewed with the patient to reinforce the necessity of reproducible breath hold.     _______________________________   Jodelle Gross, MD, PhD

## 2017-10-13 ENCOUNTER — Ambulatory Visit
Admission: RE | Admit: 2017-10-13 | Discharge: 2017-10-13 | Disposition: A | Discharge status: Home / Self Care | Payer: Medicare Other | Source: Ambulatory Visit | Attending: Radiation Oncology | Admitting: Radiation Oncology

## 2017-10-13 DIAGNOSIS — Z510 Encounter for antineoplastic radiation therapy: Secondary | ICD-10-CM | POA: Diagnosis not present

## 2017-10-14 ENCOUNTER — Telehealth: Payer: Self-pay | Admitting: Hematology

## 2017-10-14 ENCOUNTER — Ambulatory Visit
Admission: RE | Admit: 2017-10-14 | Discharge: 2017-10-14 | Disposition: A | Discharge status: Home / Self Care | Payer: Medicare Other | Source: Ambulatory Visit | Attending: Radiation Oncology | Admitting: Radiation Oncology

## 2017-10-14 DIAGNOSIS — Z510 Encounter for antineoplastic radiation therapy: Secondary | ICD-10-CM | POA: Diagnosis not present

## 2017-10-14 NOTE — Telephone Encounter (Signed)
Appointment scheduled left message regarding appointment date/time. Letter/ Calendar mailed to patient per 6/3 sch msh

## 2017-10-15 ENCOUNTER — Ambulatory Visit
Admission: RE | Admit: 2017-10-15 | Discharge: 2017-10-15 | Disposition: A | Discharge status: Home / Self Care | Payer: Medicare Other | Source: Ambulatory Visit | Attending: Radiation Oncology | Admitting: Radiation Oncology

## 2017-10-15 DIAGNOSIS — Z510 Encounter for antineoplastic radiation therapy: Secondary | ICD-10-CM | POA: Diagnosis not present

## 2017-10-16 ENCOUNTER — Ambulatory Visit
Admission: RE | Admit: 2017-10-16 | Discharge: 2017-10-16 | Disposition: A | Discharge status: Home / Self Care | Payer: Medicare Other | Source: Ambulatory Visit | Attending: Radiation Oncology | Admitting: Radiation Oncology

## 2017-10-16 DIAGNOSIS — Z510 Encounter for antineoplastic radiation therapy: Secondary | ICD-10-CM | POA: Diagnosis not present

## 2017-10-17 ENCOUNTER — Ambulatory Visit
Admission: RE | Admit: 2017-10-17 | Discharge: 2017-10-17 | Disposition: A | Discharge status: Home / Self Care | Payer: Medicare Other | Source: Ambulatory Visit | Attending: Radiation Oncology | Admitting: Radiation Oncology

## 2017-10-17 DIAGNOSIS — Z510 Encounter for antineoplastic radiation therapy: Secondary | ICD-10-CM | POA: Diagnosis not present

## 2017-10-20 ENCOUNTER — Ambulatory Visit
Admission: RE | Admit: 2017-10-20 | Discharge: 2017-10-20 | Disposition: A | Discharge status: Home / Self Care | Payer: Medicare Other | Source: Ambulatory Visit | Attending: Radiation Oncology | Admitting: Radiation Oncology

## 2017-10-20 DIAGNOSIS — Z510 Encounter for antineoplastic radiation therapy: Secondary | ICD-10-CM | POA: Diagnosis not present

## 2017-10-21 ENCOUNTER — Ambulatory Visit
Admission: RE | Admit: 2017-10-21 | Discharge: 2017-10-21 | Disposition: A | Discharge status: Home / Self Care | Payer: Medicare Other | Source: Ambulatory Visit | Attending: Radiation Oncology | Admitting: Radiation Oncology

## 2017-10-21 DIAGNOSIS — Z510 Encounter for antineoplastic radiation therapy: Secondary | ICD-10-CM | POA: Diagnosis not present

## 2017-10-22 ENCOUNTER — Ambulatory Visit
Admission: RE | Admit: 2017-10-22 | Discharge: 2017-10-22 | Disposition: A | Discharge status: Home / Self Care | Payer: Medicare Other | Source: Ambulatory Visit | Attending: Radiation Oncology | Admitting: Radiation Oncology

## 2017-10-22 DIAGNOSIS — Z510 Encounter for antineoplastic radiation therapy: Secondary | ICD-10-CM | POA: Diagnosis not present

## 2017-10-23 ENCOUNTER — Ambulatory Visit
Admission: RE | Admit: 2017-10-23 | Discharge: 2017-10-23 | Disposition: A | Discharge status: Home / Self Care | Payer: Medicare Other | Source: Ambulatory Visit | Attending: Radiation Oncology | Admitting: Radiation Oncology

## 2017-10-23 DIAGNOSIS — Z510 Encounter for antineoplastic radiation therapy: Secondary | ICD-10-CM | POA: Diagnosis not present

## 2017-10-24 ENCOUNTER — Ambulatory Visit: Payer: Medicare Other | Admitting: Radiation Oncology

## 2017-10-24 ENCOUNTER — Ambulatory Visit
Admission: RE | Admit: 2017-10-24 | Discharge: 2017-10-24 | Disposition: A | Discharge status: Home / Self Care | Payer: Medicare Other | Source: Ambulatory Visit | Attending: Radiation Oncology | Admitting: Radiation Oncology

## 2017-10-24 DIAGNOSIS — Z510 Encounter for antineoplastic radiation therapy: Secondary | ICD-10-CM | POA: Diagnosis not present

## 2017-10-27 ENCOUNTER — Ambulatory Visit: Payer: Medicare Other

## 2017-10-28 ENCOUNTER — Ambulatory Visit
Admission: RE | Admit: 2017-10-28 | Discharge: 2017-10-28 | Disposition: A | Discharge status: Home / Self Care | Payer: Medicare Other | Source: Ambulatory Visit | Attending: Radiation Oncology | Admitting: Radiation Oncology

## 2017-10-28 DIAGNOSIS — D0512 Intraductal carcinoma in situ of left breast: Secondary | ICD-10-CM | POA: Diagnosis not present

## 2017-10-28 DIAGNOSIS — Z510 Encounter for antineoplastic radiation therapy: Secondary | ICD-10-CM | POA: Insufficient documentation

## 2017-10-28 NOTE — Progress Notes (Signed)
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 This is a suspicious finding for which stereotactic biopsy is recommended.      08/25/2017 Initial Biopsy    Diagnosis 08/25/17 Breast, left, needle core biopsy, upper outer quadrant coil clip - DUCTAL CARCINOMA IN SITU WITH CALCIFICATIONS, INTERMEDIATE GRADE. - DUCTAL PAPILLOMA. - SEE MICROSCOPIC DESCRIPTION.      08/25/2017 Cancer Staging    Staging form: Breast, AJCC 8th Edition - Clinical stage from 08/25/2017: Stage 0 (cTis (DCIS), cN0, cM0, ER: Unknown, PR: Unknown, HER2: Not Assessed) - Signed by Truitt Merle, MD on 09/03/2017      08/29/2017 Initial Diagnosis    Ductal carcinoma in situ (DCIS) of left breast      09/10/2017 Surgery    left BREAST LUMPECTOMY WITH RADIOACTIVE SEED LOCALIZATION by Stark Klein, MD      09/10/2017 Pathology Results    09/10/2017 Surgical Pathology Diagnosis Breast, lumpectomy - DUCTAL CARCINOMA IN SITU, INTERMEDIATE NUCLEAR GRADE, WITH CALCIFICATIONS - MARGINS UNINVOLVED BY CARCINOMA - DUCT ECTASIA AND PERIDUCTULAR CHRONIC INFLAMMATION -  FIBROADENOMATOID NODULE - PREVIOUS BIOPSY SITE CHANGES - SEE ONCOLOGY TABLE BELOW         HISTORY OF PRESENTING ILLNESS:  Victoria Fuller 67 y.o. female is a here because of newly diagnosed DCIS. The patient was referred by her PCP. The patient presents to the clinic today accompanied by her husband and family member.  Prior to pt's abnormal mammogram, she states she did not feel any abnormality. She states she previously had no other abnormal mammogram results and she was compliant with yearly screening. She denies any unintentional weight lost and she only has mild joint pain in her back occasionally.   Pt's screening mammogram from 08/15/17 warranted further evaluation. Her diagnostic mammogram from 08/21/17 revealed new grouped pleomorphic calcifications within the upper-outer quadrant of the LEFT breast, spanning 4 mm extent. Her initial biopsy confirmed DCIS.   She has a medical history of well-controlled HTN. She has a surgical history of hysterectomy.   GYN HISTORY  Menarchal: 11-12 LMP: Hysterectomy at age [age], she believes she had her ovaries removed HRT: none  G0P0:  She has a FMHx of Ovarian CA by her mother in her 30s. She denies tobacco use and she drinks alcohol rarely.   Socially, she is married and she is a retired Electrical engineer.   CURRENT THERAPY: adjuvant radiation, plan to complete on 11/06/2017  INTERVAL HISTORY [name] is a 67 y.o. female who is here for follow-up after left breast lumpectomy. She is here with alone. Tomorrow is her last radiation session. She feels well. She complains of fatigued sometimes. She has a tan in the area  of radiation and sometimes experiences mild tingling in the area. No pain or numbness.  She is worried about side effects of anti-estrogen therapy, because she took some pills after hysterectomy to which she had several side effects. She is not sure if the pills she took were hormonal or anti-hormonal.  She experiences  mild hot flashes and night sweats.  MEDICAL HISTORY:  Past Medical History:  Diagnosis Date  . Cancer ([place]) 08/2017   Left breast cancer  . Hyperlipidemia   . Hypertension     SURGICAL HISTORY: Past Surgical History:  Procedure Laterality Date  . ABDOMINAL HYSTERECTOMY    . BREAST LUMPECTOMY WITH RADIOACTIVE SEED LOCALIZATION Left 09/10/2017   Procedure: BREAST LUMPECTOMY WITH RADIOACTIVE SEED LOCALIZATION;  Surgeon: Stark Klein, MD;  Location: [facility];  Service: General;  Laterality: Left;  . DIAGNOSTIC LAPAROSCOPY     uterine polyps    SOCIAL HISTORY: Social History   Socioeconomic History  . Marital status: Married    Spouse name: Not on file  . Number of children: Not on file  . Years of education: Not on file  . Highest education level: Not on file  Occupational History  . Not on file  Social Needs  . Financial resource strain: Not on file  . Food insecurity:    Worry: Not on file    Inability: Not on file  . Transportation needs:    Medical: Not on file    Non-medical: Not on file  Tobacco Use  . Smoking status: Never Smoker  . Smokeless tobacco: Never Used  Substance and Sexual Activity  . Alcohol use: Not Currently  . Drug use: Never  . Sexual activity: Not on file  Lifestyle  . Physical activity:    Days per week: Not on file    Minutes per session: Not on file  . Stress: Not on file  Relationships  . Social connections:    Talks on phone: Not on file    Gets together: Not on file    Attends religious service: Not on file    Active member of club or organization: Not on file    Attends meetings of clubs or organizations: Not on file    Relationship status: Not on file  . Intimate partner violence:    Fear of current or ex partner: Not on file    Emotionally abused: Not on file    Physically abused: Not on file    Forced sexual activity: Not on file  Other Topics Concern  . Not on file  Social History Narrative  . Not on  file    FAMILY HISTORY: Family History  Problem Relation Age of Onset  . Cancer Mother 59       ovarian cancer     ALLERGIES:  has No Known Allergies.  MEDICATIONS:  Current Outpatient Medications  Medication Sig Dispense Refill  . amLODipine (NORVASC) 5 MG tablet Take 5 mg by mouth daily.    Marland Kitchen aspirin 81 MG chewable tablet Chew 81 mg by mouth daily.    Marland Kitchen atorvastatin (LIPITOR) 10 MG tablet Take 10 mg by mouth daily.    . hydrochlorothiazide (HYDRODIURIL) 25 MG tablet Take 25 mg by mouth daily.    . Nutritional Supplements (JUICE PLUS FIBRE PO) Take by mouth.     No current facility-administered medications for this visit.     REVIEW OF SYSTEMS:   Constitutional: Denies fevers, chills or abnormal night sweats Eyes: Denies blurriness of vision,  double vision or watery eyes Ears, nose, mouth, throat, and face: Denies mucositis or sore throat Respiratory: Denies cough, dyspnea or wheezes Cardiovascular: Denies palpitation, chest discomfort or lower extremity swelling Gastrointestinal:  Denies nausea, heartburn or change in bowel habits Skin: Denies abnormal skin rashes (+) hyperigmentation and occasional tingling at radiation area Lymphatics: Denies new lymphadenopathy or easy bruising Neurological:Denies numbness, tingling or new weaknesses Behavioral/Psych: Mood is stable, no new changes  MSK: No myalgia or muscle pain All other systems were reviewed with the patient and are negative.  PHYSICAL EXAMINATION: ECOG PERFORMANCE STATUS: 0 - Asymptomatic  Vitals:   11/05/17 0825  BP: (!) 147/95  Pulse: 74  Resp: 20  Temp: 98.9 F (37.2 C)  SpO2: 100%   Filed Weights   11/05/17 0825  Weight: 183 lb 6.4 oz (83.2 kg)    GENERAL:alert, no distress and comfortable SKIN: skin color, texture, turgor are normal, no rashes or significant lesions EYES: normal, conjunctiva are pink and non-injected, sclera clear OROPHARYNX:no exudate, no erythema and lips, buccal mucosa, and  tongue normal  NECK: supple, thyroid normal size, non-tender, without nodularity LYMPH:  no palpable lymphadenopathy in the cervical, axillary or inguinal LUNGS: clear to auscultation and percussion with normal breathing effort HEART: regular rate & rhythm and no murmurs and no lower extremity edema ABDOMEN:abdomen soft, non-tender and normal bowel sounds Musculoskeletal:no cyanosis of digits and no clubbing  PSYCH: alert & oriented x 3 with fluent speech NEURO: no focal motor/sensory deficits Breasts: Breast inspection showed them to be symmetrical with no nipple discharge. Palpation of the breasts and axilla revealed no obvious mass that I could appreciate. (+) Diffuse skin hyperpigmentation of the left breast, secondary to radiation, no ulcers or discharge.  LABORATORY DATA:  I have reviewed the data as listed CBC Latest Ref Rng & Units 09/03/2017  WBC 3.9 - 10.3 K/uL 3.4(L)  Hemoglobin 11.6 - 15.9 g/dL 13.5  Hematocrit 34.8 - 46.6 % 40.1  Platelets 145 - 400 K/uL 177   CMP Latest Ref Rng & Units 09/03/2017  Glucose 70 - 140 mg/dL 101  BUN 7 - 26 mg/dL 7  Creatinine 0.60 - 1.10 mg/dL 0.74  Sodium 136 - 145 mmol/L 142  Potassium 3.5 - 5.1 mmol/L 3.2(L)  Chloride 98 - 109 mmol/L 102  CO2 22 - 29 mmol/L 32(H)  Calcium 8.4 - 10.4 mg/dL 10.5(H)  Total Protein 6.4 - 8.3 g/dL 7.5  Total Bilirubin 0.2 - 1.2 mg/dL 1.9(H)  Alkaline Phos 40 - 150 U/L 73  AST 5 - 34 U/L 22  ALT 0 - 55 U/L 40    PATHOLOGY 09/10/2017 Surgical Pathology Diagnosis Breast, lumpectomy - DUCTAL CARCINOMA IN SITU, INTERMEDIATE NUCLEAR GRADE, WITH CALCIFICATIONS - MARGINS UNINVOLVED BY CARCINOMA - DUCT ECTASIA AND PERIDUCTULAR CHRONIC INFLAMMATION - FIBROADENOMATOID NODULE - PREVIOUS BIOPSY SITE CHANGES - SEE ONCOLOGY TABLE BELOW Microscopic Comment DCIS OF THE BREAST: Resection Procedure: Resection Specimen Laterality: Left Size of DCIS: 0.3 cm Histologic Type: Ductal carcinoma in situ Nuclear Grade:  Grade II, Intermediate Necrosis: Present, focal Margins: Uninvolved by ductal carcinoma in situ Specify closest margin (required only if <48m): 2 mm; posterior Regional Lymph Nodes: No lymph nodes submitted or found Breast Biomarker Testing Performed on Previous Biopsy: Testing Performed on Case Number: [id number] Receptor: Positive (100%, strong) Progesterone Receptor: Positive (40%, strong) Representative Tumor Block: 1E Pathologic Stage Classification (pTNM, AJCC 8th Edition): pTis, pNX (v4.2.0.0)ecimen Gross and Clinical Information Specimen(s) Obtained: Breast, lumpectomy Gross Specimen type: Received fresh and placed  in formalin at 12:15 p.m. on 09/10/2017, and labeled left lumpectomy with radioactive seed. Size: 5.9 cm superior to inferior x 3.6 cm medial to lateral x 1.6 cm anterior to posterior. Orientation: The specimen is received inked as follows: Anterior - green, inferior - blue, lateral - orange, medial - yellow, posterior - black, superior - red. Localized area: There are two pins inserted on the lateral aspect of the specimen. A radioactive seed is identified and removed per protocol. Cut surface: Yellow lobulated adipose tissue with tan-white, dense fibrous tissue (approximately 70% fibrous). At the localized area of interest, there is a 2.2 x 1.2 x 0.8 cm area of red-brown hemorrhagic tissue noted. Within the hemorrhage a silver metallic coil-shaped biopsy clip is identified. Margins: The margins are inked as previously stated. The hemorrhagic tissue abuts the posterior margin, measures 0.6 cm to the lateral margin, 1.6 cm to the anterior margin, and 1.8 cm to the medial margin. Prognostic indicators: Obtain from paraffin blocks if needed. Block summary: Nine blocks submitted. A = inferior margin, perpendicular. B - H = hemorrhagic cavity and surrounding fibrous tissue. I = superior margin, perpendicular. (KL:ecj 09/10/2017)  Diagnosis 08/25/17 Breast,  left, needle core biopsy, upper outer quadrant coil clip - DUCTAL CARCINOMA IN SITU WITH CALCIFICATIONS, INTERMEDIATE GRADE. - DUCTAL PAPILLOMA. - SEE MICROSCOPIC DESCRIPTION. Microscopic Comment Estrogen and progesterone receptor will be performed. Dr. Melina Copa agrees. Called to [facility] on 08/26/17. (JDP:ah 08/26/17)    RADIOGRAPHIC STUDIES: I have personally reviewed the radiological images as listed and agreed with the findings in the report.  Diagnostic Mammogram 08/21/17 IMPRESSION: New grouped pleomorphic calcifications within the upper-outer quadrant of the LEFT breast, spanning 4 mm extent. This is a suspicious finding for which stereotactic biopsy is recommended.  Screening Mammogram 08/15/17 IMPRESSION: Further evaluation is suggested for calcifications in the left Breast.  No results found.  ASSESSMENT & PLAN:  Bryannah Boston is 67 year old post-menopausal woman, presented with screening discovered to breast cancer.   1. 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 Stage 0 grade 2, ER+/PR+, G2 --We discussed her imaging findings and the biopsy results in great details. --She is now status post left breast lumpectomy, which showed intermediate grade DCIS, surgical margins were negative. -We discussed her that her DCIS was completely cured by surgical resection, any form of adjuvant therapy is to prevent future breast cancer.  She is at high risk for breast cancer due to her history of DCIS. -Her last radiation session will be tomorrow 11/06/2017 -Due to her relatively young age, intermediate grade DCIS, I recommend her to consider antiestrogen therapy to prevent future breast cancer.  The option of tamoxifen, raloxifene and anastrozole were discussed with her, including potential side effects.  She is very concerned about potential side effects from medication, and she declined. -I discussed breast cancer surveillance, she  will continue annual mammogram, I also recommend breast MRI annually for screening due to the higher sensitivity.  She will think about it. -She will see Dr. Barry Dienes in 6 months, she also see her primary care physician who does breast exam. - I will follow-up with her every year  2.  Hypertension and dyslipidemia -Continue medication and follow-up with primary care physician  PLAN: -Follow-up every year -Diagnostic mammogram at [facility] in 07/2018  -she declined antiestrogen therapy  Orders Placed This Encounter  Procedures  . MM DIAG BREAST TOMO BILATERAL    Standing Status:   Future  Standing Expiration Date:   11/06/2018    Scheduling Instructions:     At [facility]    Order Specific Question:   Reason for Exam (SYMPTOM  OR DIAGNOSIS REQUIRED)    Answer:   screening    Order Specific Question:   Preferred imaging location?    Answer:   [facility]   All questions were answered. The patient knows to call the clinic with any problems, questions or concerns.  I spent 20 minutes counseling the patient face to face. The total time spent in the appointment was 25 minutes and more than 50% was on counseling.  Dierdre Searles Dweik am acting as scribe for Dr. Truitt Merle.  I have reviewed the above documentation for accuracy and completeness, and I agree with the above.    Truitt Merle, MD 11/05/2017

## 2017-10-29 ENCOUNTER — Ambulatory Visit
Admission: RE | Admit: 2017-10-29 | Discharge: 2017-10-29 | Disposition: A | Discharge status: Home / Self Care | Payer: Medicare Other | Source: Ambulatory Visit | Attending: Radiation Oncology | Admitting: Radiation Oncology

## 2017-10-29 DIAGNOSIS — Z510 Encounter for antineoplastic radiation therapy: Secondary | ICD-10-CM | POA: Diagnosis not present

## 2017-10-31 ENCOUNTER — Ambulatory Visit
Admission: RE | Admit: 2017-10-31 | Discharge: 2017-10-31 | Disposition: A | Discharge status: Home / Self Care | Payer: Medicare Other | Source: Ambulatory Visit | Attending: Radiation Oncology | Admitting: Radiation Oncology

## 2017-10-31 DIAGNOSIS — Z510 Encounter for antineoplastic radiation therapy: Secondary | ICD-10-CM | POA: Diagnosis not present

## 2017-10-31 DIAGNOSIS — D0512 Intraductal carcinoma in situ of left breast: Secondary | ICD-10-CM

## 2017-10-31 MED ORDER — RADIAPLEXRX EX GEL
Freq: Once | CUTANEOUS | Status: AC
  Administered 2017-10-31: 11:00:00 via TOPICAL

## 2017-11-03 ENCOUNTER — Ambulatory Visit
Admission: RE | Admit: 2017-11-03 | Discharge: 2017-11-03 | Disposition: A | Discharge status: Home / Self Care | Payer: Medicare Other | Source: Ambulatory Visit | Attending: Radiation Oncology | Admitting: Radiation Oncology

## 2017-11-03 DIAGNOSIS — Z510 Encounter for antineoplastic radiation therapy: Secondary | ICD-10-CM | POA: Diagnosis not present

## 2017-11-04 ENCOUNTER — Ambulatory Visit
Admission: RE | Admit: 2017-11-04 | Discharge: 2017-11-04 | Disposition: A | Discharge status: Home / Self Care | Payer: Medicare Other | Source: Ambulatory Visit | Attending: Radiation Oncology | Admitting: Radiation Oncology

## 2017-11-04 DIAGNOSIS — Z510 Encounter for antineoplastic radiation therapy: Secondary | ICD-10-CM | POA: Diagnosis not present

## 2017-11-05 ENCOUNTER — Ambulatory Visit: Payer: Medicare Other

## 2017-11-05 ENCOUNTER — Inpatient Hospital Stay: Payer: Medicare Other | Attending: Hematology | Admitting: Hematology

## 2017-11-05 ENCOUNTER — Telehealth: Payer: Self-pay

## 2017-11-05 ENCOUNTER — Ambulatory Visit
Admission: RE | Admit: 2017-11-05 | Discharge: 2017-11-05 | Disposition: A | Discharge status: Home / Self Care | Payer: Medicare Other | Source: Ambulatory Visit | Attending: Radiation Oncology | Admitting: Radiation Oncology

## 2017-11-05 ENCOUNTER — Encounter: Payer: Self-pay | Admitting: Hematology

## 2017-11-05 VITALS — BP 147/95 | HR 74 | Temp 98.9°F | Resp 20 | Ht 68.0 in | Wt 183.4 lb

## 2017-11-05 DIAGNOSIS — Z170 Estrogen receptor positive status [ER+]: Secondary | ICD-10-CM | POA: Diagnosis not present

## 2017-11-05 DIAGNOSIS — E785 Hyperlipidemia, unspecified: Secondary | ICD-10-CM | POA: Diagnosis not present

## 2017-11-05 DIAGNOSIS — Z780 Asymptomatic menopausal state: Secondary | ICD-10-CM | POA: Diagnosis not present

## 2017-11-05 DIAGNOSIS — Z7982 Long term (current) use of aspirin: Secondary | ICD-10-CM | POA: Insufficient documentation

## 2017-11-05 DIAGNOSIS — D0512 Intraductal carcinoma in situ of left breast: Secondary | ICD-10-CM | POA: Insufficient documentation

## 2017-11-05 DIAGNOSIS — Z79899 Other long term (current) drug therapy: Secondary | ICD-10-CM | POA: Insufficient documentation

## 2017-11-05 DIAGNOSIS — I10 Essential (primary) hypertension: Secondary | ICD-10-CM | POA: Insufficient documentation

## 2017-11-05 DIAGNOSIS — Z510 Encounter for antineoplastic radiation therapy: Secondary | ICD-10-CM | POA: Diagnosis not present

## 2017-11-05 NOTE — Telephone Encounter (Signed)
Printed avs and calender of upcoming appointment. Per 7/10 los.  

## 2017-11-06 ENCOUNTER — Ambulatory Visit
Admission: RE | Admit: 2017-11-06 | Discharge: 2017-11-06 | Disposition: A | Discharge status: Home / Self Care | Payer: Medicare Other | Source: Ambulatory Visit | Attending: Radiation Oncology | Admitting: Radiation Oncology

## 2017-11-06 ENCOUNTER — Encounter: Payer: Self-pay | Admitting: Radiation Oncology

## 2017-11-06 ENCOUNTER — Ambulatory Visit: Payer: Medicare Other | Admitting: Hematology

## 2017-11-06 DIAGNOSIS — Z510 Encounter for antineoplastic radiation therapy: Secondary | ICD-10-CM | POA: Diagnosis not present

## 2017-11-19 NOTE — Progress Notes (Signed)
  Radiation Oncology         [phone] ________________________________  Name: Victoria Fuller MRN: [id number]  Date: 11/06/2017  DOB: 01-May-1950  End of Treatment Note  Diagnosis:  67 y.o.  Female with ER/PR positive DCIS of the left breast  Indication for treatment:  Curative       Radiation treatment dates:   10/08/17 - 11/06/17  Site/dose:   The patient initially received a dose of 42.56 Gy in 16 fractions to the breast using whole-breast tangent fields. This was delivered using a 3-D conformal technique. The patient then received a boost to the seroma. This delivered an additional 8 Gy in 4 fractions using a 3 field photon technique due to the depth of the seroma. The total dose was 50.56 Gy.  Narrative: The patient tolerated radiation treatment relatively well.   The patient had some expected skin irritation as she progressed during treatment. Moist desquamation was not present at the end of treatment.  Plan: The patient has completed radiation treatment. The patient will return to radiation oncology clinic for routine followup in one month. I advised the patient to call or return sooner if they have any questions or concerns related to their recovery or treatment. ________________________________  Jodelle Gross, M.D., Ph.D.  This document serves as a record of services personally performed by Kyung Rudd, MD. It was created on his behalf by Wilburn Mylar, a trained medical scribe. The creation of this record is based on the scribe's personal observations and the provider's statements to them. This document has been checked and approved by the attending provider.

## 2017-12-10 ENCOUNTER — Telehealth: Payer: Self-pay | Admitting: *Deleted

## 2017-12-10 NOTE — Telephone Encounter (Signed)
CALLED PATIENT TO ALTER FU APPT. FOR 12-17-17 DUE TO AP NEEDING TO SEE A CONSULT, RESCHEDULED FOR 12-25-17 @ 3:30 PM, LVM FOR A RETURN CALL

## 2017-12-17 ENCOUNTER — Inpatient Hospital Stay: Admission: RE | Admit: 2017-12-17 | Payer: Self-pay | Source: Ambulatory Visit | Admitting: Radiation Oncology

## 2017-12-23 ENCOUNTER — Encounter: Payer: Self-pay | Admitting: *Deleted

## 2017-12-25 ENCOUNTER — Encounter: Payer: Self-pay | Admitting: Radiation Oncology

## 2017-12-25 ENCOUNTER — Ambulatory Visit
Admission: RE | Admit: 2017-12-25 | Discharge: 2017-12-25 | Disposition: A | Discharge status: Home / Self Care | Payer: Medicare Other | Source: Ambulatory Visit | Attending: Radiation Oncology | Admitting: Radiation Oncology

## 2017-12-25 ENCOUNTER — Other Ambulatory Visit: Payer: Self-pay

## 2017-12-25 VITALS — BP 136/83 | HR 96 | Temp 99.2°F | Resp 18 | Ht 69.0 in | Wt 184.2 lb

## 2017-12-25 DIAGNOSIS — Z79899 Other long term (current) drug therapy: Secondary | ICD-10-CM | POA: Diagnosis not present

## 2017-12-25 DIAGNOSIS — Z170 Estrogen receptor positive status [ER+]: Secondary | ICD-10-CM | POA: Diagnosis not present

## 2017-12-25 DIAGNOSIS — Z7982 Long term (current) use of aspirin: Secondary | ICD-10-CM | POA: Insufficient documentation

## 2017-12-25 DIAGNOSIS — D0512 Intraductal carcinoma in situ of left breast: Secondary | ICD-10-CM | POA: Diagnosis present

## 2017-12-25 NOTE — Progress Notes (Signed)
  Radiation Oncology         [phone] ________________________________  Name: Melora Menon MRN: [id number]  Date of Service: 12/25/2017  DOB: 1951/03/01  Post Treatment Note  CC: Donald Prose, MD  Truitt Merle, MD  Diagnosis:   ER/PR positiveDCIS of the left breast  Interval Since Last Radiation:  7 weeks   10/08/17 - 11/06/17: The patient initially received a dose of 42.56 Gy in 16 fractions to the breast using whole-breast tangent fields. This was delivered using a 3-D conformal technique. The patient then received a boost to the seroma. This delivered an additional 8 Gy in 4 fractions using a 3 field photon technique due to the depth of the seroma. The total dose was 50.56 Gy.  Narrative:  The patient returns today for routine follow-up. During treatment she did very well with radiotherapy and did not have significant desquamation.                             On review of systems, the patient states she is doing well overall. She reports improvement in the tanning effect over her skin. She denies any other concerns. She decided to forgo antiestrogen therapy as well. No other complaints are verbalized.  ALLERGIES:  has No Known Allergies.  Meds: Current Outpatient Medications  Medication Sig Dispense Refill  . amLODipine (NORVASC) 5 MG tablet Take 5 mg by mouth daily.    Marland Kitchen aspirin 81 MG chewable tablet Chew 81 mg by mouth daily.    Marland Kitchen atorvastatin (LIPITOR) 10 MG tablet Take 10 mg by mouth daily.    . hydrochlorothiazide (HYDRODIURIL) 25 MG tablet Take 25 mg by mouth daily.    . Nutritional Supplements (JUICE PLUS FIBRE PO) Take by mouth.     No current facility-administered medications for this encounter.     Physical Findings:  height is 5\' 9"  (1.753 m) and weight is 184 lb 3.2 oz (83.6 kg). Her oral temperature is 99.2 F (37.3 C). Her blood pressure is 136/83 and her pulse is 96. Her respiration is 18 and oxygen saturation is 100%.  Pain Assessment Pain Score: 0-No  pain/10 In general this is a well appearing caucasian female in no acute distress. She's alert and oriented x4 and appropriate throughout the examination. Cardiopulmonary assessment is negative for acute distress and she exhibits normal effort. The left breast was examined and reveals mild hyperpigmentation along the areola. No visible abnormalities are noted otherwise and stable seborrheic keratoses are seen as were seen during and prior to treatment.   Lab Findings: Lab Results  Component Value Date   WBC 3.4 (L) 09/03/2017   HGB 13.5 09/03/2017   HCT 40.1 09/03/2017   MCV 86.8 09/03/2017   PLT 177 09/03/2017     Radiographic Findings: No results found.  Impression/Plan: 1. ER/PR positiveDCIS of the left breast. The patient has been doing well since completion of radiotherapy. We discussed that we would be happy to continue to follow her as needed, but she will also continue to follow up with Dr. Burr Medico in medical oncology. She was counseled on skin care as well as measures to avoid sun exposure to this area.  2. Survivorship. We discussed the importance of survivorship evaluation and she is currently scheduled for this in the near future. She was also given the monthly calendar for access to resources offered within the cancer center.     Carola Rhine, PAC

## 2018-02-25 ENCOUNTER — Telehealth: Payer: Self-pay

## 2018-02-25 NOTE — Telephone Encounter (Signed)
LVM for pt reminding of SCP visit with NP on 03/03/18 at 10 am.

## 2018-03-03 ENCOUNTER — Encounter: Payer: Self-pay | Admitting: Adult Health

## 2018-03-03 ENCOUNTER — Telehealth: Payer: Self-pay | Admitting: Adult Health

## 2018-03-03 ENCOUNTER — Inpatient Hospital Stay: Payer: Medicare Other | Attending: Hematology | Admitting: Adult Health

## 2018-03-03 VITALS — BP 128/73 | HR 72 | Temp 98.6°F | Resp 18 | Ht 69.0 in | Wt 185.4 lb

## 2018-03-03 DIAGNOSIS — I10 Essential (primary) hypertension: Secondary | ICD-10-CM | POA: Diagnosis not present

## 2018-03-03 DIAGNOSIS — Z170 Estrogen receptor positive status [ER+]: Secondary | ICD-10-CM | POA: Insufficient documentation

## 2018-03-03 DIAGNOSIS — Z7982 Long term (current) use of aspirin: Secondary | ICD-10-CM | POA: Insufficient documentation

## 2018-03-03 DIAGNOSIS — Z923 Personal history of irradiation: Secondary | ICD-10-CM | POA: Diagnosis not present

## 2018-03-03 DIAGNOSIS — D0512 Intraductal carcinoma in situ of left breast: Secondary | ICD-10-CM | POA: Diagnosis present

## 2018-03-03 DIAGNOSIS — Z79899 Other long term (current) drug therapy: Secondary | ICD-10-CM | POA: Diagnosis not present

## 2018-03-03 NOTE — Telephone Encounter (Signed)
No 11/5 los orders.

## 2018-03-03 NOTE — Progress Notes (Signed)
CLINIC:  Survivorship   REASON FOR VISIT:  Routine follow-up post-treatment for a recent history of breast cancer.  BRIEF ONCOLOGIC HISTORY:  Oncology History   Cancer Staging Ductal carcinoma in situ (DCIS) of left breast Staging form: Breast, AJCC 8th Edition - Clinical stage from 08/25/2017: Stage 0 (cTis (DCIS), cN0, cM0, ER: Unknown, PR: Unknown, HER2: Not Assessed) - Signed by Truitt Merle, MD on 09/03/2017       Ductal carcinoma in situ (DCIS) of left breast   08/21/2017 Mammogram    IMPRESSION: New grouped pleomorphic calcifications within the upper-outer quadrant of the LEFT breast, spanning 4 mm extent. This is a suspicious finding for which stereotactic biopsy is recommended.    08/25/2017 Initial Biopsy    Diagnosis 08/25/17 Breast, left, needle core biopsy, upper outer quadrant coil clip - DUCTAL CARCINOMA IN SITU WITH CALCIFICATIONS, INTERMEDIATE GRADE. - DUCTAL PAPILLOMA. - SEE MICROSCOPIC DESCRIPTION.    08/25/2017 Cancer Staging    Staging form: Breast, AJCC 8th Edition - Clinical stage from 08/25/2017: Stage 0 (cTis (DCIS), cN0, cM0, ER: Unknown, PR: Unknown, HER2: Not Assessed) - Signed by Truitt Merle, MD on 09/03/2017    08/29/2017 Initial Diagnosis    Ductal carcinoma in situ (DCIS) of left breast    09/10/2017 Surgery    left BREAST LUMPECTOMY WITH RADIOACTIVE SEED LOCALIZATION by Stark Klein, MD    09/10/2017 Pathology Results    09/10/2017 Surgical Pathology Diagnosis Breast, lumpectomy - DUCTAL CARCINOMA IN SITU, INTERMEDIATE NUCLEAR GRADE, WITH CALCIFICATIONS - MARGINS UNINVOLVED BY CARCINOMA - DUCT ECTASIA AND PERIDUCTULAR CHRONIC INFLAMMATION - FIBROADENOMATOID NODULE - PREVIOUS BIOPSY SITE CHANGES - SEE ONCOLOGY TABLE BELOW      10/08/2017 - 11/06/2017 Radiation Therapy    The patient initially received a dose of 42.56 Gy in 16 fractions to the breast using whole-breast tangent fields. This was delivered using a 3-D conformal technique. The patient  then received a boost to the seroma. This delivered an additional 8 Gy in 4 fractions using a 3 field photon technique due to the depth of the seroma. The total dose was 50.56 Gy.     INTERVAL HISTORY:  Victoria Fuller presents to the Survivorship Clinic today for our initial meeting to review her survivorship care plan detailing her treatment course for breast cancer, as well as monitoring long-term side effects of that treatment, education regarding health maintenance, screening, and overall wellness and health promotion.     Overall, Victoria Fuller reports feeling quite well.  Amire notes a mild pull with left shoulder ROM when she is doing her stretches. She also notes some mild pain. Otherwise she is feeling well.      REVIEW OF SYSTEMS:  Review of Systems  Constitutional: Negative for appetite change, chills, fatigue, fever and unexpected weight change.  HENT:   Negative for hearing loss, lump/mass and trouble swallowing.   Eyes: Negative for icterus.  Respiratory: Negative for chest tightness, cough and shortness of breath.   Cardiovascular: Negative for leg swelling and palpitations.  Gastrointestinal: Negative for abdominal distention, abdominal pain, constipation, diarrhea, nausea and vomiting.  Endocrine: Negative for hot flashes.  Musculoskeletal: Negative for arthralgias.  Skin: Negative for itching and rash.  Neurological: Negative for dizziness, extremity weakness, headaches and numbness.  Hematological: Negative for adenopathy.  Psychiatric/Behavioral: Negative for depression. The patient is not nervous/anxious.   Breast: Denies any new nodularity, masses, tenderness, nipple changes, or nipple discharge.      ONCOLOGY TREATMENT TEAM:  1. Surgeon:  Dr. Barry Dienes  at [facility] 2. Medical Oncologist: Dr. Burr Medico  3. Radiation Oncologist: Dr. Lisbeth Renshaw    PAST MEDICAL/SURGICAL HISTORY:  Past Medical History:  Diagnosis Date  . Cancer ([place]) 08/2017   Left breast  cancer  . Hyperlipidemia   . Hypertension    Past Surgical History:  Procedure Laterality Date  . ABDOMINAL HYSTERECTOMY    . BREAST LUMPECTOMY WITH RADIOACTIVE SEED LOCALIZATION Left 09/10/2017   Procedure: BREAST LUMPECTOMY WITH RADIOACTIVE SEED LOCALIZATION;  Surgeon: Stark Klein, MD;  Location: [facility];  Service: General;  Laterality: Left;  . DIAGNOSTIC LAPAROSCOPY     uterine polyps     ALLERGIES:  No Known Allergies   CURRENT MEDICATIONS:  Outpatient Encounter Medications as of 03/03/2018  Medication Sig  . amLODipine (NORVASC) 5 MG tablet Take 5 mg by mouth daily.  Marland Kitchen aspirin 81 MG chewable tablet Chew 81 mg by mouth daily.  Marland Kitchen atorvastatin (LIPITOR) 10 MG tablet Take 10 mg by mouth daily.  . hydrochlorothiazide (HYDRODIURIL) 25 MG tablet Take 25 mg by mouth daily.  . Nutritional Supplements (JUICE PLUS FIBRE PO) Take by mouth.   No facility-administered encounter medications on file as of 03/03/2018.      ONCOLOGIC FAMILY HISTORY:  Family History  Problem Relation Age of Onset  . Cancer Mother 49       ovarian cancer      GENETIC COUNSELING/TESTING: Recommended, patient declined  SOCIAL HISTORY:  Social History   Socioeconomic History  . Marital status: Married    Spouse name: Not on file  . Number of children: Not on file  . Years of education: Not on file  . Highest education level: Not on file  Occupational History  . Not on file  Social Needs  . Financial resource strain: Not on file  . Food insecurity:    Worry: Not on file    Inability: Not on file  . Transportation needs:    Medical: Not on file    Non-medical: Not on file  Tobacco Use  . Smoking status: Never Smoker  . Smokeless tobacco: Never Used  Substance and Sexual Activity  . Alcohol use: Not Currently  . Drug use: Never  . Sexual activity: Not on file  Lifestyle  . Physical activity:    Days per week: Not on file    Minutes per session: Not on file  .  Stress: Not on file  Relationships  . Social connections:    Talks on phone: Not on file    Gets together: Not on file    Attends religious service: Not on file    Active member of club or organization: Not on file    Attends meetings of clubs or organizations: Not on file    Relationship status: Not on file  . Intimate partner violence:    Fear of current or ex partner: No    Emotionally abused: No    Physically abused: No    Forced sexual activity: No  Other Topics Concern  . Not on file  Social History Narrative  . Not on file      PHYSICAL EXAMINATION:  Vital Signs:   Vitals:   03/03/18 0928  BP: 128/73  Pulse: 72  Resp: 18  Temp: 98.6 F (37 C)  SpO2: 100%   Filed Weights   03/03/18 0928  Weight: 185 lb 6.4 oz (84.1 kg)   General: Well-nourished, well-appearing female in no acute distress.  She is unaccompanied today.  HEENT: Head is normocephalic.  Pupils equal and reactive to light. Conjunctivae clear without exudate.  Sclerae anicteric. Oral mucosa is pink, moist.  Oropharynx is pink without lesions or erythema.  Lymph: No cervical, supraclavicular, or infraclavicular lymphadenopathy noted on palpation.  Cardiovascular: Regular rate and rhythm.Marland Kitchen Respiratory: Clear to auscultation bilaterally. Chest expansion symmetric; breathing non-labored.  Breast: right breast benign, left breast s/p lumpectomy and radiation, no sign of local recurrence GI: Abdomen soft and round; non-tender, non-distended. Bowel sounds normoactive.  GU: Deferred.  Neuro: No focal deficits. Steady gait.  Psych: Mood and affect normal and appropriate for situation.  Extremities: No edema. MSK: No focal spinal tenderness to palpation.  Full range of motion in bilateral upper extremities, pain noted with left shoulder ROM Skin: Warm and dry.  LABORATORY DATA:  None for this visit.  DIAGNOSTIC IMAGING:  None for this visit.      ASSESSMENT AND PLAN:  Ms.. Fuller is a pleasant 67  y.o. female with Stage 0 left breast DCIS, ER+/PR+, diagnosed in 07/2017, treated with lumpectomy, adjuvant radiation therapy, anti estrogen therapy declined.  She presents to the Survivorship Clinic for our initial meeting and routine follow-up post-completion of treatment for breast cancer.    1. Stage 0 left breast cancer:  Victoria Fuller is continuing to recover from definitive treatment for breast cancer. She will follow-up with her medical oncologist, Dr. Burr Medico in 10/2018 with history and physical exam per surveillance protocol. She has mammogram that is ordered for April, 2020 and I reviewed with her Dr. Ernestina Penna recommendation for breast MRI.  She is amenable to this, and therefore it should be done around 01/2019.  Today, a comprehensive survivorship care plan and treatment summary was reviewed with the patient today detailing her breast cancer diagnosis, treatment course, potential late/long-term effects of treatment, appropriate follow-up care with recommendations for the future, and patient education resources.  A copy of this summary, along with a letter will be sent to the patient's primary care provider via mail/fax/In Basket message after today's visit.    2. Left shoulder ROM limitation/pain: Recommended Victoria Fuller continue her exercises.  If doesn't improve she will call and I will refer her to our PT group for their evaluation.  3. Bone health:  Given Victoria Fuller age/history of breast cancer, she is at risk for bone demineralization.  Her last DEXA scan was 07/07/2017, which showed normal results.   She was given education on specific activities to promote bone health.  4. Cancer screening:  Due to Victoria Fuller history and her age, she should receive screening for skin cancers, colon cancer, and gynecologic cancers.  The information and recommendations are listed on the patient's comprehensive care plan/treatment summary and were reviewed in detail with the patient.    5. Health maintenance and  wellness promotion: Victoria Fuller was encouraged to consume 5-7 servings of fruits and vegetables per day. We reviewed the "Nutrition Rainbow" handout, as well as the handout "Take Control of Your Health and Reduce Your Cancer Risk" from the [employer].  She was also encouraged to engage in moderate to vigorous exercise for 30 minutes per day most days of the week. We discussed the [employer] fitness program, which is designed for cancer survivors to help them become more physically fit after cancer treatments.  She was instructed to limit her alcohol consumption and continue to abstain from tobacco use.     6. Support services/counseling: It is not uncommon for this period of the patient's cancer  care trajectory to be one of many emotions and stressors.  We discussed an opportunity for her to participate in the next session of [facility] ("Finding Your New Normal") support group series designed for patients after they have completed treatment.   Victoria Fuller was encouraged to take advantage of our many other support services programs, support groups, and/or counseling in coping with her new life as a cancer survivor after completing anti-cancer treatment.  She was offered support today through active listening and expressive supportive counseling.  She was given information regarding our available services and encouraged to contact me with any questions or for help enrolling in any of our support group/programs.    Dispo:   -Return to cancer center for f/u with Dr. Burr Medico in 10/2018 -Mammogram due in 07/2018 -Breast MRI 01/2019 -Follow up with Dr. Barry Dienes in 04/2018 -She is welcome to return back to the Survivorship Clinic at any time; no additional follow-up needed at this time.  -Consider referral back to survivorship as a long-term survivor for continued surveillance  A total of (30) minutes of face-to-face time was spent with this patient with greater than 50% of that time in counseling and  care-coordination.   Victoria Phlegm, NP Survivorship Program [facility] [phone]   Note: PRIMARY CARE PROVIDER Donald Prose, [place] [phone]

## 2018-08-21 ENCOUNTER — Other Ambulatory Visit: Payer: Self-pay

## 2018-08-21 ENCOUNTER — Ambulatory Visit
Admission: RE | Admit: 2018-08-21 | Discharge: 2018-08-21 | Disposition: A | Discharge status: Home / Self Care | Payer: Medicare Other | Source: Ambulatory Visit | Attending: Hematology | Admitting: Hematology

## 2018-08-21 DIAGNOSIS — D0512 Intraductal carcinoma in situ of left breast: Secondary | ICD-10-CM

## 2018-10-20 ENCOUNTER — Other Ambulatory Visit: Payer: Self-pay | Admitting: Hematology

## 2018-10-26 ENCOUNTER — Telehealth: Payer: Self-pay | Admitting: Hematology

## 2018-10-26 NOTE — Telephone Encounter (Signed)
Called pt per 6/29 sch message -no answer / left message for pt to call back for reschedule.  \

## 2018-11-04 ENCOUNTER — Inpatient Hospital Stay: Payer: Medicare Other | Admitting: Hematology

## 2018-11-19 NOTE — Progress Notes (Signed)
[place]   [phone] [phone]   Clinic Follow up Note   Patient Care Team: Donald Prose, MD as PCP - General (Family Medicine) Stark Klein, MD as Consulting Physician (General Surgery) Truitt Merle, MD as Consulting Physician (Hematology) Kyung Rudd, MD as Consulting Physician (Radiation Oncology) Gardenia Phlegm, NP as Nurse Practitioner (Hematology and Oncology)  Date of Service:  11/23/2018  CHIEF COMPLAINT: F/u of left breast DCIS   SUMMARY OF ONCOLOGIC HISTORY: Oncology History Overview Note  Cancer Staging Ductal carcinoma in situ (DCIS) of left breast Staging form: Breast, AJCC 8th Edition - Clinical stage from 08/25/2017: Stage 0 (cTis (DCIS), cN0, cM0, ER: Unknown, PR: Unknown, HER2: Not Assessed) - Signed by Truitt Merle, MD on 09/03/2017     Ductal carcinoma in situ (DCIS) of left breast  08/21/2017 Mammogram   IMPRESSION: New grouped pleomorphic calcifications within the upper-outer quadrant of the LEFT breast, spanning 4 mm extent. This is a suspicious finding for which stereotactic biopsy is recommended.   08/25/2017 Initial Biopsy   Diagnosis 08/25/17 Breast, left, needle core biopsy, upper outer quadrant coil clip - DUCTAL CARCINOMA IN SITU WITH CALCIFICATIONS, INTERMEDIATE GRADE. - DUCTAL PAPILLOMA. - SEE MICROSCOPIC DESCRIPTION.   08/25/2017 Cancer Staging   Staging form: Breast, AJCC 8th Edition - Clinical stage from 08/25/2017: Stage 0 (cTis (DCIS), cN0, cM0, ER: Unknown, PR: Unknown, HER2: Not Assessed) - Signed by Truitt Merle, MD on 09/03/2017   08/29/2017 Initial Diagnosis   Ductal carcinoma in situ (DCIS) of left breast   09/10/2017 Surgery   left BREAST LUMPECTOMY WITH RADIOACTIVE SEED LOCALIZATION by Stark Klein, MD   09/10/2017 Pathology Results   09/10/2017 Surgical Pathology Diagnosis Breast, lumpectomy - DUCTAL CARCINOMA IN SITU, INTERMEDIATE NUCLEAR GRADE, WITH CALCIFICATIONS - MARGINS UNINVOLVED BY  CARCINOMA - DUCT ECTASIA AND PERIDUCTULAR CHRONIC INFLAMMATION - FIBROADENOMATOID NODULE - PREVIOUS BIOPSY SITE CHANGES - SEE ONCOLOGY TABLE BELOW     10/08/2017 - 11/06/2017 Radiation Therapy   The patient initially received a dose of 42.56 Gy in 16 fractions to the breast using whole-breast tangent fields. This was delivered using a 3-D conformal technique. The patient then received a boost to the seroma. This delivered an additional 8 Gy in 4 fractions using a 3 field photon technique due to the depth of the seroma. The total dose was 50.56 Gy.    Anti-estrogen oral therapy   She declined anti-estrogen therapy due to concerns with side effects.     Anti-estrogen oral therapy   She declined anti-estrgoen therapy given concerns of side effects.       CURRENT THERAPY:  Surveillance  INTERVAL HISTORY:  Victoria Fuller is here for a follow up left breast DCIS. She was seen by me 1 year ago and attended survivorship clinic 8 months ago. She presents to the clinic alone. She notes she is doing well. She denies nay pain or any new major changes. She notes she did gain weight has been walking more around her neighborhood. She sees her PCP in the late Winter and Fall    REVIEW OF SYSTEMS:   Constitutional: Denies fevers, chills (+) weight gain  Eyes: Denies blurriness of vision Ears, nose, mouth, throat, and face: Denies mucositis or sore throat Respiratory: Denies cough, dyspnea or wheezes Cardiovascular: Denies palpitation, chest discomfort or lower extremity swelling Gastrointestinal:  Denies nausea, heartburn or change in bowel habits Skin: Denies abnormal skin rashes Lymphatics: Denies new lymphadenopathy or easy bruising Neurological:Denies numbness, tingling or new weaknesses  Behavioral/Psych: Mood is stable, no new changes  All other systems were reviewed with the patient and are negative.  MEDICAL HISTORY:  Past Medical History:  Diagnosis Date  . Cancer ([place]) 08/2017    Left breast cancer  . Hyperlipidemia   . Hypertension     SURGICAL HISTORY: Past Surgical History:  Procedure Laterality Date  . ABDOMINAL HYSTERECTOMY    . BREAST LUMPECTOMY Left 2019  . BREAST LUMPECTOMY WITH RADIOACTIVE SEED LOCALIZATION Left 09/10/2017   Procedure: BREAST LUMPECTOMY WITH RADIOACTIVE SEED LOCALIZATION;  Surgeon: Stark Klein, MD;  Location: [facility];  Service: General;  Laterality: Left;  . DIAGNOSTIC LAPAROSCOPY     uterine polyps    I have reviewed the social history and family history with the patient and they are unchanged from previous note.  ALLERGIES:  has No Known Allergies.  MEDICATIONS:  Current Outpatient Medications  Medication Sig Dispense Refill  . amLODipine (NORVASC) 5 MG tablet Take 5 mg by mouth daily.    Marland Kitchen aspirin 81 MG chewable tablet Chew 81 mg by mouth daily.    Marland Kitchen atorvastatin (LIPITOR) 10 MG tablet Take 10 mg by mouth daily.    . hydrochlorothiazide (HYDRODIURIL) 25 MG tablet Take 25 mg by mouth daily.    . Nutritional Supplements (JUICE PLUS FIBRE PO) Take by mouth.     No current facility-administered medications for this visit.     PHYSICAL EXAMINATION: ECOG PERFORMANCE STATUS: 0 - Asymptomatic  Vitals:   11/23/18 0853  BP: (!) 143/79  Pulse: 79  Resp: 18  Temp: 98 F (36.7 C)  SpO2: 100%   Filed Weights   11/23/18 0853  Weight: 193 lb 3.2 oz (87.6 kg)    GENERAL:alert, no distress and comfortable SKIN: skin color, texture, turgor are normal, no rashes or significant lesions EYES: normal, Conjunctiva are pink and non-injected, sclera clear  NECK: supple, thyroid normal size, non-tender, without nodularity LYMPH:  no palpable lymphadenopathy in the cervical, axillary  LUNGS: clear to auscultation and percussion with normal breathing effort HEART: regular rate & rhythm and no murmurs and no lower extremity edema ABDOMEN:abdomen soft, non-tender and normal bowel sounds Musculoskeletal:no cyanosis of  digits and no clubbing  NEURO: alert & oriented x 3 with fluent speech, no focal motor/sensory deficits BREAST: S/p left lumpectomy: Surgical incision healed well. (+) Left skin hyperpigmentation with skin firmness form RT (+) lumpy breast tissue mores so around nipple, benign. No palpable mass, nodules or adenopathy bilaterally. Breast exam benign.   LABORATORY DATA:  I have reviewed the data as listed CBC Latest Ref Rng & Units 09/03/2017  WBC 3.9 - 10.3 K/uL 3.4(L)  Hemoglobin 11.6 - 15.9 g/dL 13.5  Hematocrit 34.8 - 46.6 % 40.1  Platelets 145 - 400 K/uL 177     CMP Latest Ref Rng & Units 09/03/2017  Glucose 70 - 140 mg/dL 101  BUN 7 - 26 mg/dL 7  Creatinine 0.60 - 1.10 mg/dL 0.74  Sodium 136 - 145 mmol/L 142  Potassium 3.5 - 5.1 mmol/L 3.2(L)  Chloride 98 - 109 mmol/L 102  CO2 22 - 29 mmol/L 32(H)  Calcium 8.4 - 10.4 mg/dL 10.5(H)  Total Protein 6.4 - 8.3 g/dL 7.5  Total Bilirubin 0.2 - 1.2 mg/dL 1.9(H)  Alkaline Phos 40 - 150 U/L 73  AST 5 - 34 U/L 22  ALT 0 - 55 U/L 40      RADIOGRAPHIC STUDIES: I have personally reviewed the radiological images as listed and agreed with  the findings in the report. No results found.   ASSESSMENT & PLAN:  Victoria Fuller is a 68 y.o. female with   1. 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 She is s/p left lumpectomy and adjuvant radiation.  -We previously discussed that her DCIS was completely cured by surgical resection, any form of adjuvant therapy is to prevent future breast cancer.  She is at high risk for breast cancer due to her history of DCIS. -She did complete adjuvant radiation. Tolerated well.  -She declined chemoprevention with antiestrogen therapy. -She is clinically doing well. Her physical exam and her 07/2018 mammogram were unremarkable. There is no clinical concern for new breast cancer. -I again reviewed the benefit of  screening MRI given her high risk of breast cancer and high breast density and strongly encouraged her to consider to do so every 1-2 years. She will think about it.  -Continue Surveillance, next mammogram in 07/2019.  -Given she is not on active treatment with me, she opted to continue her surveillance under her PCP. I will follow up with her as needed in the future.   2. Hypertension and dyslipidemia, weight gain -Continue medication and follow-up with primary care physician -BP at 143/79 -I discussed her changing her diet to reduce carbohydrates and to continue exercising several times a week. She is agreeable.   PLAN: -F/u with me as needed in the future. She will f/u with her PCP for breast cancer surveillance, I encouraged her consider screening breast MRI in addition to mammogram.   No problem-specific Assessment & Plan notes found for this encounter.   No orders of the defined types were placed in this encounter.  All questions were answered. The patient knows to call the clinic with any problems, questions or concerns. No barriers to learning was detected. I spent 10 minutes counseling the patient face to face. The total time spent in the appointment was 15 minutes and more than 50% was on counseling and review of test results     Truitt Merle, MD 11/23/2018   I, Joslyn Devon, am acting as scribe for Truitt Merle, MD.   I have reviewed the above documentation for accuracy and completeness, and I agree with the above.

## 2018-11-23 ENCOUNTER — Telehealth: Payer: Self-pay | Admitting: Hematology

## 2018-11-23 ENCOUNTER — Other Ambulatory Visit: Payer: Self-pay

## 2018-11-23 ENCOUNTER — Inpatient Hospital Stay: Payer: Medicare Other | Attending: Hematology | Admitting: Hematology

## 2018-11-23 ENCOUNTER — Encounter: Payer: Self-pay | Admitting: Hematology

## 2018-11-23 VITALS — BP 143/79 | HR 79 | Temp 98.0°F | Resp 18 | Ht 69.0 in | Wt 193.2 lb

## 2018-11-23 DIAGNOSIS — D0512 Intraductal carcinoma in situ of left breast: Secondary | ICD-10-CM | POA: Insufficient documentation

## 2018-11-23 DIAGNOSIS — R635 Abnormal weight gain: Secondary | ICD-10-CM | POA: Diagnosis not present

## 2018-11-23 DIAGNOSIS — Z923 Personal history of irradiation: Secondary | ICD-10-CM | POA: Diagnosis not present

## 2018-11-23 DIAGNOSIS — Z79899 Other long term (current) drug therapy: Secondary | ICD-10-CM | POA: Insufficient documentation

## 2018-11-23 DIAGNOSIS — E785 Hyperlipidemia, unspecified: Secondary | ICD-10-CM | POA: Diagnosis not present

## 2018-11-23 DIAGNOSIS — I10 Essential (primary) hypertension: Secondary | ICD-10-CM | POA: Insufficient documentation

## 2018-11-23 DIAGNOSIS — Z7982 Long term (current) use of aspirin: Secondary | ICD-10-CM | POA: Diagnosis not present

## 2018-11-23 NOTE — Telephone Encounter (Signed)
Per 7/27 los F/u open .

## 2019-07-15 ENCOUNTER — Other Ambulatory Visit: Payer: Self-pay | Admitting: Hematology

## 2019-07-15 DIAGNOSIS — Z9889 Other specified postprocedural states: Secondary | ICD-10-CM

## 2019-07-24 ENCOUNTER — Ambulatory Visit: Payer: Medicare Other | Attending: Internal Medicine

## 2019-07-24 DIAGNOSIS — Z23 Encounter for immunization: Secondary | ICD-10-CM

## 2019-07-24 NOTE — Progress Notes (Signed)
   Covid-19 Vaccination Clinic  Name:  Victoria Fuller    MRN: [id number] DOB: [birth year]  07/24/2019  [name] was observed post Covid-19 immunization for 15 minutes without incident. She was provided with Vaccine Information Sheet and instruction to access the V-Safe system.   [name] was instructed to call 911 with any severe reactions post vaccine: Marland Kitchen Difficulty breathing  . Swelling of face and throat  . A fast heartbeat  . A bad rash all over body  . Dizziness and weakness   Immunizations Administered    Name Date Dose VIS Date Route   Pfizer COVID-19 Vaccine 07/24/2019  3:33 PM 0.3 mL 04/09/2019 Intramuscular   Manufacturer: [place]   Lot: [id number]   [place]: [id number]

## 2019-08-17 ENCOUNTER — Ambulatory Visit: Payer: Medicare Other | Attending: Internal Medicine

## 2019-08-17 DIAGNOSIS — Z23 Encounter for immunization: Secondary | ICD-10-CM

## 2019-08-17 NOTE — Progress Notes (Signed)
   Covid-19 Vaccination Clinic  Name:  Aracelia Emshoff    MRN: [id number] DOB: Sep 19, 1950  08/17/2019  Ms. Mura was observed post Covid-19 immunization for 15 minutes without incident. She was provided with Vaccine Information Sheet and instruction to access the V-Safe system.   Ms. Romack was instructed to call 911 with any severe reactions post vaccine: Marland Kitchen Difficulty breathing  . Swelling of face and throat  . A fast heartbeat  . A bad rash all over body  . Dizziness and weakness   Immunizations Administered    Name Date Dose VIS Date Route   Pfizer COVID-19 Vaccine 08/17/2019 11:36 AM 0.3 mL 06/23/2018 Intramuscular   Manufacturer: [place]   Lot: [id number]   [place]: [id number]

## 2019-09-29 ENCOUNTER — Other Ambulatory Visit: Payer: Self-pay | Admitting: Hematology

## 2019-09-29 ENCOUNTER — Ambulatory Visit
Admission: RE | Admit: 2019-09-29 | Discharge: 2019-09-29 | Disposition: A | Discharge status: Home / Self Care | Payer: Medicare Other | Source: Ambulatory Visit | Attending: Hematology | Admitting: Hematology

## 2019-09-29 ENCOUNTER — Other Ambulatory Visit: Payer: Self-pay

## 2019-09-29 DIAGNOSIS — R921 Mammographic calcification found on diagnostic imaging of breast: Secondary | ICD-10-CM

## 2019-09-29 DIAGNOSIS — Z9889 Other specified postprocedural states: Secondary | ICD-10-CM

## 2019-10-06 ENCOUNTER — Ambulatory Visit
Admission: RE | Admit: 2019-10-06 | Discharge: 2019-10-06 | Disposition: A | Discharge status: Home / Self Care | Payer: Medicare Other | Source: Ambulatory Visit | Attending: Hematology | Admitting: Hematology

## 2019-10-06 ENCOUNTER — Other Ambulatory Visit: Payer: Self-pay

## 2019-10-06 DIAGNOSIS — R921 Mammographic calcification found on diagnostic imaging of breast: Secondary | ICD-10-CM

## 2019-10-07 ENCOUNTER — Telehealth: Payer: Self-pay | Admitting: Hematology

## 2019-10-07 NOTE — Telephone Encounter (Signed)
Scheduled appt per 6/10 staff message.  Left a vm of the appt date and time.

## 2019-10-11 NOTE — Progress Notes (Signed)
[place]   [phone] [phone]   Clinic Follow up Note   Patient Care Team: Donald Prose, MD as PCP - General (Family Medicine) Stark Klein, MD as Consulting Physician (General Surgery) Truitt Merle, MD as Consulting Physician (Hematology) Kyung Rudd, MD as Consulting Physician (Radiation Oncology) Gardenia Phlegm, NP as Nurse Practitioner (Hematology and Oncology)  Date of Service:  10/13/2019  CHIEF COMPLAINT: F/u of left breast DCIS   SUMMARY OF ONCOLOGIC HISTORY: Oncology History Overview Note  Cancer Staging Ductal carcinoma in situ (DCIS) of left breast Staging form: Breast, AJCC 8th Edition - Clinical stage from 08/25/2017: Stage 0 (cTis (DCIS), cN0, cM0, ER: Unknown, PR: Unknown, HER2: Not Assessed) - Signed by Truitt Merle, MD on 09/03/2017 - Pathologic: Stage 0 (pTis (DCIS), pN0, cM0, ER+, PR+) - Unsigned     Ductal carcinoma in situ (DCIS) of left breast  08/21/2017 Mammogram   IMPRESSION: New grouped pleomorphic calcifications within the upper-outer quadrant of the LEFT breast, spanning 4 mm extent. This is a suspicious finding for which stereotactic biopsy is recommended.   08/25/2017 Initial Biopsy   Diagnosis 08/25/17 Breast, left, needle core biopsy, upper outer quadrant coil clip - DUCTAL CARCINOMA IN SITU WITH CALCIFICATIONS, INTERMEDIATE GRADE. - DUCTAL PAPILLOMA. - SEE MICROSCOPIC DESCRIPTION.   08/25/2017 Cancer Staging   Staging form: Breast, AJCC 8th Edition - Clinical stage from 08/25/2017: Stage 0 (cTis (DCIS), cN0, cM0, ER: Unknown, PR: Unknown, HER2: Not Assessed) - Signed by Truitt Merle, MD on 09/03/2017   08/29/2017 Initial Diagnosis   Ductal carcinoma in situ (DCIS) of left breast   09/10/2017 Surgery   left BREAST LUMPECTOMY WITH RADIOACTIVE SEED LOCALIZATION by Stark Klein, MD   09/10/2017 Pathology Results   09/10/2017 Surgical Pathology Diagnosis Breast, lumpectomy - DUCTAL CARCINOMA IN SITU,  INTERMEDIATE NUCLEAR GRADE, WITH CALCIFICATIONS - MARGINS UNINVOLVED BY CARCINOMA - DUCT ECTASIA AND PERIDUCTULAR CHRONIC INFLAMMATION - FIBROADENOMATOID NODULE - PREVIOUS BIOPSY SITE CHANGES - SEE ONCOLOGY TABLE BELOW     10/08/2017 - 11/06/2017 Radiation Therapy   The patient initially received a dose of 42.56 Gy in 16 fractions to the breast using whole-breast tangent fields. This was delivered using a 3-D conformal technique. The patient then received a boost to the seroma. This delivered an additional 8 Gy in 4 fractions using a 3 field photon technique due to the depth of the seroma. The total dose was 50.56 Gy.    Anti-estrogen oral therapy   She declined anti-estrogen therapy due to concerns with side effects.    Ductal carcinoma in situ (DCIS) of right breast  09/29/2019 Mammogram   Diagnostic Mammogram on 09/29/19  IMPRESSION: Suspicious calcifications spanning 45m in the 12 o'clock region of the right breast.   10/06/2019 Initial Biopsy   Diagnosis Breast, right, needle core biopsy, upper central, coil clip - DUCTAL CARCINOMA IN SITU WITH CALCIFICATIONS, INTERMEDIATE NUCLEAR GRADE. - SEE MICROSCOPIC DESCRIPTION. Microscopic Comment Estrogen and progesterone receptors will be performed. PROGNOSTIC INDICATOR RESULTS: Immunohistochemical and morphometric analysis performed manually Estrogen Receptor: 100%, STRONG STAINING INTESITY Progesterone Receptor: 80%, STRONG STAINING INTESITY   10/13/2019 Initial Diagnosis   Ductal carcinoma in situ (DCIS) of right breast      CURRENT THERAPY:  Surveillance  INTERVAL HISTORY:  [name] here for a follow up of left breast DCIS. She was last seen by me almost 1 year ago. She presents to the clinic alone. She was having a routine screening mammogram in 09/2019 and was found to  have right breast mass. Her biopsy showed Right Breast DCIS. She notes she did not feel the mass herself and denies recent breast changes. She plans to  consult with Dr Barry Dienes on 7/2. She notes she is managing this diagnosis better than her first DCIS. She notes with annual check with PCP her labs were overall good.    REVIEW OF SYSTEMS:   Constitutional: Denies fevers, chills or abnormal weight loss Eyes: Denies blurriness of vision Ears, nose, mouth, throat, and face: Denies mucositis or sore throat Respiratory: Denies cough, dyspnea or wheezes Cardiovascular: Denies palpitation, chest discomfort or lower extremity swelling Gastrointestinal:  Denies nausea, heartburn or change in bowel habits Skin: Denies abnormal skin rashes Lymphatics: Denies new lymphadenopathy or easy bruising Neurological:Denies numbness, tingling or new weaknesses Behavioral/Psych: Mood is stable, no new changes  All other systems were reviewed with the patient and are negative.  MEDICAL HISTORY:  Past Medical History:  Diagnosis Date  . Cancer ([place]) 08/2017   Left breast cancer  . Hyperlipidemia   . Hypertension     SURGICAL HISTORY: Past Surgical History:  Procedure Laterality Date  . ABDOMINAL HYSTERECTOMY    . BREAST LUMPECTOMY Left 2019  . BREAST LUMPECTOMY WITH RADIOACTIVE SEED LOCALIZATION Left 09/10/2017   Procedure: BREAST LUMPECTOMY WITH RADIOACTIVE SEED LOCALIZATION;  Surgeon: Stark Klein, MD;  Location: [facility];  Service: General;  Laterality: Left;  . DIAGNOSTIC LAPAROSCOPY     uterine polyps    I have reviewed the social history and family history with the patient and they are unchanged from previous note.  ALLERGIES:  has No Known Allergies.  MEDICATIONS:  Current Outpatient Medications  Medication Sig Dispense Refill  . amLODipine (NORVASC) 5 MG tablet Take 5 mg by mouth daily.    Marland Kitchen aspirin 81 MG chewable tablet Chew 81 mg by mouth daily.    Marland Kitchen atorvastatin (LIPITOR) 10 MG tablet Take 10 mg by mouth daily.    . hydrochlorothiazide (HYDRODIURIL) 25 MG tablet Take 25 mg by mouth daily.    . Nutritional Supplements  (JUICE PLUS FIBRE PO) Take by mouth.     No current facility-administered medications for this visit.    PHYSICAL EXAMINATION: ECOG PERFORMANCE STATUS: 0 - Asymptomatic  Vitals:   10/13/19 1259  BP: (!) 160/95  Pulse: 84  Resp: 20  Temp: 97.8 F (36.6 C)  SpO2: 100%   Filed Weights   10/13/19 1259  Weight: 183 lb 1.6 oz (83.1 kg)    Due to COVID19 we will limit examination to appearance. Patient had no complaints.  GENERAL:alert, no distress and comfortable SKIN: skin color normal, no rashes or significant lesions EYES: normal, Conjunctiva are pink and non-injected, sclera clear  NEURO: alert & oriented x 3 with fluent speech  Breast Exam was deferred today   LABORATORY DATA:  I have reviewed the data as listed CBC Latest Ref Rng & Units 09/03/2017  WBC 3.9 - 10.3 K/uL 3.4(L)  Hemoglobin 11.6 - 15.9 g/dL 13.5  Hematocrit 34 - 46 % 40.1  Platelets 145 - 400 K/uL 177     CMP Latest Ref Rng & Units 09/03/2017  Glucose 70 - 140 mg/dL 101  BUN 7 - 26 mg/dL 7  Creatinine 0.60 - 1.10 mg/dL 0.74  Sodium 136 - 145 mmol/L 142  Potassium 3.5 - 5.1 mmol/L 3.2(L)  Chloride 98 - 109 mmol/L 102  CO2 22 - 29 mmol/L 32(H)  Calcium 8.4 - 10.4 mg/dL 10.5(H)  Total Protein 6.4 -  8.3 g/dL 7.5  Total Bilirubin 0.2 - 1.2 mg/dL 1.9(H)  Alkaline Phos 40 - 150 U/L 73  AST 5 - 34 U/L 22  ALT 0 - 55 U/L 40      RADIOGRAPHIC STUDIES: I have personally reviewed the radiological images as listed and agreed with the findings in the report. No results found.   ASSESSMENT & PLAN:  Victoria Fuller is a 69 y.o. female with    1. Ductal Carcinoma in situ (DCIS) of Right breast, ER/PR+, Intermediate grade -I discussed her breast imaging and needle biopsy results with patient. She had right breast mass found on 09/29/19 routine screening mammogram. Her 10/06/19 breast biopsy showed Right breast DCIS with calcifications, intermediate grade.  -She is a candidate for breast conservation surgery. She  will consult with Dr Barry Dienes on 10/29/19. She will likely recommend lumpectomy. I discussed although it is not medically necessary to have B/l mastectomy is it an option due to her previous DICS and she can discuss with Dr Barry Dienes.  -Her DCIS will be cured by complete surgical resection. Any form of adjuvant therapy is preventive. -I discussed due to this being her second DCIS she has high risk of developing a third episode of DCIS or invasive breast cancer.  -Adjuvant radiation with Dr Lisbeth Renshaw is recommended to reduce her risk of future breast cancer locally if she undergo lumpectomy.  -Given her strongly positive ER and PR, I recommend antiestrogen therapy with Tamoxifen or Anastrozole for 5 years.   -The potential benefit and side effects of Anastrozole, which includes but not limited to, hot flash, skin and vaginal dryness, metabolic changes ( increased blood glucose, cholesterol, weight, etc.), slightly in increased risk of cardiovascular disease, cataracts, muscular and joint discomfort, osteopenia and osteoporosis, etc, were discussed with her in great details.   -The potential side effect of Tamoxifen which includes but not limited to, hot flash, skin and vaginal dryness, slightly increased risk of cardiovascular disease and cataract, small risk of thrombosis and endometrial cancer, were discussed with her in great details.  -Given she has had hysterectomy in the past, I recommend Tamoxifen. I gave her print out of this medication and reviewed weight management, remaining active and watching for vaginal discharge.  -She will consider Tamoxifen. Plan to start after Radiation.  -We also discussed that biopsy may have sampling limitation, we will review her surgical path, to see if she has any invasive carcinoma components. -We also discussed the breast cancer surveillance after her surgery. She will continue annual screening mammogram, self exams, and a routine office visit with lab and exam with [place]. Given  her Density C breast tissue and her high risk of future breast cancer, I discussed the option of additional screening with annual breast MRIs. She would alternate every 6 months with mammograms. She is agreeable.  -She will proceed with Surgery and Radiation. F/u last week of RT.  -I answered all her questions to her understanding and satisfaction.    2. 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 She is s/p left lumpectomy and adjuvant radiation.  -We previously discussed that her DCIS was completely cured by surgical resection, any form of adjuvant therapy is to prevent future breast cancer. She is at high risk for breast cancer due to her history of DCIS. -She did complete adjuvant radiation. Tolerated well.  -Shedeclined chemoprevention with antiestrogen therapy.   3. Hypertension and dyslipidemia, weight  gain -Continue medication and follow-up with primary care physician -BP at 143/79 -I discussed her changing her diet to reduce carbohydrates and to continue exercising several times a week. She is agreeable.    PLAN: -Proceed with Surgery -F/u last week of Radiation, to finalize adjuvant antiestrogen therapy. She will consider it this time    No problem-specific Assessment & Plan notes found for this encounter.   No orders of the defined types were placed in this encounter.  All questions were answered. The patient knows to call the clinic with any problems, questions or concerns. No barriers to learning was detected. The total time spent in the appointment was 30 minutes.     Truitt Merle, MD 10/13/2019   I, Joslyn Devon, am acting as scribe for Truitt Merle, MD.   I have reviewed the above documentation for accuracy and completeness, and I agree with the above.

## 2019-10-13 ENCOUNTER — Inpatient Hospital Stay: Payer: Medicare Other | Attending: Hematology | Admitting: Hematology

## 2019-10-13 ENCOUNTER — Encounter: Payer: Self-pay | Admitting: *Deleted

## 2019-10-13 ENCOUNTER — Encounter: Payer: Self-pay | Admitting: Hematology

## 2019-10-13 ENCOUNTER — Other Ambulatory Visit: Payer: Self-pay

## 2019-10-13 DIAGNOSIS — D0511 Intraductal carcinoma in situ of right breast: Secondary | ICD-10-CM | POA: Diagnosis present

## 2019-10-13 DIAGNOSIS — Z79899 Other long term (current) drug therapy: Secondary | ICD-10-CM | POA: Insufficient documentation

## 2019-10-13 DIAGNOSIS — Z170 Estrogen receptor positive status [ER+]: Secondary | ICD-10-CM | POA: Insufficient documentation

## 2019-10-13 DIAGNOSIS — I10 Essential (primary) hypertension: Secondary | ICD-10-CM | POA: Insufficient documentation

## 2019-10-13 DIAGNOSIS — E785 Hyperlipidemia, unspecified: Secondary | ICD-10-CM | POA: Diagnosis not present

## 2019-10-13 DIAGNOSIS — R635 Abnormal weight gain: Secondary | ICD-10-CM | POA: Diagnosis not present

## 2019-10-13 DIAGNOSIS — D0512 Intraductal carcinoma in situ of left breast: Secondary | ICD-10-CM | POA: Diagnosis not present

## 2019-10-14 ENCOUNTER — Encounter: Payer: Self-pay | Admitting: Hematology

## 2019-10-28 ENCOUNTER — Other Ambulatory Visit: Payer: Self-pay | Admitting: General Surgery

## 2019-10-28 DIAGNOSIS — C50411 Malignant neoplasm of upper-outer quadrant of right female breast: Secondary | ICD-10-CM

## 2019-10-28 DIAGNOSIS — Z170 Estrogen receptor positive status [ER+]: Secondary | ICD-10-CM

## 2019-11-02 ENCOUNTER — Encounter: Payer: Self-pay | Admitting: *Deleted

## 2019-11-08 ENCOUNTER — Encounter: Payer: Self-pay | Admitting: *Deleted

## 2019-11-08 ENCOUNTER — Other Ambulatory Visit: Payer: Self-pay | Admitting: General Surgery

## 2019-11-08 DIAGNOSIS — D0511 Intraductal carcinoma in situ of right breast: Secondary | ICD-10-CM

## 2019-11-08 DIAGNOSIS — C50411 Malignant neoplasm of upper-outer quadrant of right female breast: Secondary | ICD-10-CM

## 2019-11-08 DIAGNOSIS — Z170 Estrogen receptor positive status [ER+]: Secondary | ICD-10-CM

## 2019-11-08 NOTE — Pre-Procedure Instructions (Signed)
Victoria Fuller  11/08/2019      [facility] [address], [place] [address] [place] [address] Phone: [phone] Fax: [phone]    Your procedure is scheduled on July 22  Report to [facility] Entrance A at 2:00 P.M.  Call this number if you have problems the morning of surgery:  [phone]   Remember:  Do not eat  after midnight.   You may drink clear liquids until 1:00 P.M. .  Clear liquids allowed are:                    Water, Juice (non-citric and without pulp - diabetics please choose diet or no sugar options), Carbonated beverages - (diabetics please choose diet or no sugar options), Clear Tea, Black Coffee only (no creamer, milk or cream including half and half), Plain Jell-O only (diabetics please choose diet or no sugar options), Gatorade (diabetics please choose diet or no sugar options) and Plain Popsicles only        Please complete your PRE-SURGERY ENSURE that was provided to you by 1:00P.M.  the morning of surgery.  Please, if able, drink it in one setting. DO NOT SIP.    Take these medicines the morning of surgery with A SIP OF WATER :              Amlodipine (norvasc)              7 days prior to surgery STOP taking any Aspirin (unless otherwise instructed by your surgeon), Aleve, Naproxen, Ibuprofen, Motrin, Advil, Goody's, BC's, all herbal medications, fish oil, and all vitamins.                       Follow your surgeon's instructions on when to stop Aspirin.  If no instructions were given by your surgeon then you will need to call the office to get those instructions.       Do not wear jewelry, make-up or nail polish.  Do not wear lotions, powders, or perfumes, or deodorant.  Do not shave 48 hours prior to surgery.  Men may shave face and neck.  Do not bring valuables to the hospital.  [facility] is not responsible for any belongings or valuables.  Contacts, dentures or bridgework may not be worn  into surgery.  Leave your suitcase in the car.  After surgery it may be brought to your room.  For patients admitted to the hospital, discharge time will be determined by your treatment team.  Patients discharged the day of surgery will not be allowed to drive home.   Special instructions:  [facility]- Preparing For Surgery  Before surgery, you can play an important role. Because skin is not sterile, your skin needs to be as free of germs as possible. You can reduce the number of germs on your skin by washing with CHG (chlorahexidine gluconate) Soap before surgery.  CHG is an antiseptic cleaner which kills germs and bonds with the skin to continue killing germs even after washing.    Oral Hygiene is also important to reduce your risk of infection.  Remember - BRUSH YOUR TEETH THE MORNING OF SURGERY WITH YOUR REGULAR TOOTHPASTE  Please do not use if you have an allergy to CHG or antibacterial soaps. If your skin becomes reddened/irritated stop using the CHG.  Do not shave (including legs and underarms) for at least 48 hours prior to first CHG shower. It  is OK to shave your face.  Please follow these instructions carefully.   1. Shower the NIGHT BEFORE SURGERY and the MORNING OF SURGERY with CHG.   2. If you chose to wash your hair, wash your hair first as usual with your normal shampoo.  3. After you shampoo, rinse your hair and body thoroughly to remove the shampoo.  4. Use CHG as you would any other liquid soap. You can apply CHG directly to the skin and wash gently with a scrungie or a clean washcloth.   5. Apply the CHG Soap to your body ONLY FROM THE NECK DOWN.  Do not use on open wounds or open sores. Avoid contact with your eyes, ears, mouth and genitals (private parts). Wash Face and genitals (private parts)  with your normal soap.  6. Wash thoroughly, paying special attention to the area where your surgery will be performed.  7. Thoroughly rinse your body with warm water from  the neck down.  8. DO NOT shower/wash with your normal soap after using and rinsing off the CHG Soap.  9. Pat yourself dry with a CLEAN TOWEL.  10. Wear CLEAN PAJAMAS to bed the night before surgery, wear comfortable clothes the morning of surgery  11. Place CLEAN SHEETS on your bed the night of your first shower and DO NOT SLEEP WITH PETS.    Day of Surgery:  Do not apply any deodorants/lotions.  Please wear clean clothes to the hospital/surgery center.   Remember to brush your teeth WITH YOUR REGULAR TOOTHPASTE.    Please read over the following fact sheets that you were given. Coughing and Deep Breathing and Surgical Site Infection Prevention

## 2019-11-09 ENCOUNTER — Other Ambulatory Visit: Payer: Self-pay

## 2019-11-09 ENCOUNTER — Encounter (HOSPITAL_COMMUNITY)
Admission: RE | Admit: 2019-11-09 | Discharge: 2019-11-09 | Disposition: A | Discharge status: Home / Self Care | Payer: Medicare Other | Source: Ambulatory Visit | Attending: General Surgery | Admitting: General Surgery

## 2019-11-09 ENCOUNTER — Encounter (HOSPITAL_COMMUNITY): Payer: Self-pay

## 2019-11-09 DIAGNOSIS — Z01818 Encounter for other preprocedural examination: Secondary | ICD-10-CM | POA: Diagnosis present

## 2019-11-09 DIAGNOSIS — I10 Essential (primary) hypertension: Secondary | ICD-10-CM | POA: Insufficient documentation

## 2019-11-09 HISTORY — DX: Presence of spectacles and contact lenses: Z97.3

## 2019-11-09 LAB — BASIC METABOLIC PANEL
Anion gap: 10 (ref 5–15)
BUN: 8 mg/dL (ref 8–23)
CO2: 28 mmol/L (ref 22–32)
Calcium: 9.5 mg/dL (ref 8.9–10.3)
Chloride: 103 mmol/L (ref 98–111)
Creatinine, Ser: 0.61 mg/dL (ref 0.44–1.00)
GFR calc Af Amer: >60 mL/min (ref >60)
GFR calc non Af Amer: >60 mL/min (ref >60)
Glucose, Bld: 97 mg/dL (ref 70–99)
Potassium: 3.1 mmol/L — ABNORMAL LOW (ref 3.5–5.1)
Sodium: 141 mmol/L (ref 135–145)

## 2019-11-09 LAB — CBC
HCT: 39 % (ref 36.0–46.0)
Hemoglobin: 12.8 g/dL (ref 12.0–15.0)
MCH: 28.7 pg (ref 26.0–34.0)
MCHC: 32.8 g/dL (ref 30.0–36.0)
MCV: 87.4 fL (ref 80.0–100.0)
Platelets: 143 10*3/uL — ABNORMAL LOW (ref 150–400)
RBC: 4.46 MIL/uL (ref 3.87–5.11)
RDW: 12.7 % (ref 11.5–15.5)
WBC: 2.9 10*3/uL — ABNORMAL LOW (ref 4.0–10.5)
nRBC: 0 % (ref 0.0–0.2)

## 2019-11-09 NOTE — Progress Notes (Signed)
Pt denies SOB, chest pain, and being under the care of a cardiologist. Pt stated that PCP is Dr. Nancy Fetter. Pt denies having a stress test, echo and cardiac cath. Pt denies having an EKG and chest x ray. Pt denies recent labs. Pt reminded to continue to quarantine. Pt verbalized understanding of all pre-op instructions.

## 2019-11-15 ENCOUNTER — Other Ambulatory Visit (HOSPITAL_COMMUNITY)
Admission: RE | Admit: 2019-11-15 | Discharge: 2019-11-15 | Disposition: A | Discharge status: Home / Self Care | Payer: Medicare Other | Source: Ambulatory Visit | Attending: General Surgery | Admitting: General Surgery

## 2019-11-15 ENCOUNTER — Encounter: Payer: Self-pay | Admitting: *Deleted

## 2019-11-15 DIAGNOSIS — Z20822 Contact with and (suspected) exposure to covid-19: Secondary | ICD-10-CM | POA: Insufficient documentation

## 2019-11-15 DIAGNOSIS — Z01812 Encounter for preprocedural laboratory examination: Secondary | ICD-10-CM | POA: Diagnosis present

## 2019-11-15 LAB — SARS CORONAVIRUS 2 (TAT 6-24 HRS): SARS Coronavirus 2: NEGATIVE

## 2019-11-17 ENCOUNTER — Other Ambulatory Visit: Payer: Self-pay

## 2019-11-17 ENCOUNTER — Ambulatory Visit
Admission: RE | Admit: 2019-11-17 | Discharge: 2019-11-17 | Disposition: A | Discharge status: Home / Self Care | Payer: Medicare Other | Source: Ambulatory Visit | Attending: General Surgery | Admitting: General Surgery

## 2019-11-17 DIAGNOSIS — C50411 Malignant neoplasm of upper-outer quadrant of right female breast: Secondary | ICD-10-CM

## 2019-11-17 DIAGNOSIS — Z170 Estrogen receptor positive status [ER+]: Secondary | ICD-10-CM

## 2019-11-17 NOTE — H&P (Signed)
Victoria Fuller Appointment: 10/29/2019 9:45 AM Location: [facility] Patient #: [id number] DOB: [birth year] Married / Language: English / Race: Black or African American Female   History of Present Illness Stark Klein MD; 10/29/2019 10:18 AM) The patient is a 69 year old female who presents for a Follow-up for Breast cancer.Pt is a 69 yo F dx with left breast cancer 08/25/2017 and now right breast cancer dx 09/2019. She presented with screening detected left breast calcifications. She had a 4 mm area of calcs in the upper outer quadrant of the left breast. Core needle biopsy was performed and showed intermediate grade DCIS wtih calcs. ER/PR pending. She has no personal or family history of cancer. She is a retired Research scientist (life sciences). She has never smoked and does not use alcohol or drugs. She had menarche at age 82 or 50. She had hysterectomy age 28. She did not use HRT, and used OCPs for around 2 years. She is nulliparous but has adopted children. She is up to date with her colonoscopy and bone density study.   She underwent left seed localized lumpectomy 09/10/2017. No invasive cancer was seen and margins were negative. She had 3 mm total of DCIS. She did well with surgery. She did not require narcotics. She received adjuvant radiation.   Pt had her annual mammogram and was found to have 7 mm of suspicious calcs in the upper outer quadrant of the right breast. These were biopsied and found to be malignant. She hasn't had any other new issues. This was hormone positive DCIS. She wasn't having any issues prior to mammogram.  pathology from RIGHT biopsy 10/06/2019 Breast, right, needle core biopsy, upper central, coil clip - DUCTAL CARCINOMA IN SITU WITH CALCIFICATIONS, INTERMEDIATE NUCLEAR GRADE Estrogen Receptor: 100%, STRONG STAINING INTESITY Progesterone Receptor: 80%, STRONG STAINING INTESITY  diagnostic mammogram 09/2019 CLINICAL DATA: History of left  breast cancer status post lumpectomy in 2019.  EXAM: DIGITAL DIAGNOSTIC BILATERAL MAMMOGRAM WITH CAD AND TOMO  COMPARISON: Previous exam(s).  ACR Breast Density Category c: The breast tissue is heterogeneously dense, which may obscure small masses.  FINDINGS: Stable lumpectomy changes are seen in the left breast. No suspicious mass or malignant type microcalcifications identified in the left breast.  In the 12 o'clock region of the right breast there is interval development of coarse heterogeneous calcifications spanning an area of 7 mm. There is no suspicious mass.  Mammographic images were processed with CAD.  IMPRESSION: Suspicious calcifications in the 12 o'clock region of the right breast.  RECOMMENDATION: Stereotactic biopsy of the calcifications in the calcifications in the 12 o'clock region of the right breast is recommended.  I have discussed the findings and recommendations with the patient. If applicable, a reminder letter will be sent to the patient regarding the next appointment.  BI-RADS CATEGORY 4: Suspicious.       pathology 09/10/2017 Breast, lumpectomy - DUCTAL CARCINOMA IN SITU, INTERMEDIATE NUCLEAR GRADE, WITH CALCIFICATIONS - MARGINS UNINVOLVED BY CARCINOMA - DUCT ECTASIA AND PERIDUCTULAR CHRONIC INFLAMMATION - FIBROADENOMATOID NODULE - PREVIOUS BIOPSY SITE CHANGES    Allergies Sabino Gasser, CMA; 10/29/2019 9:54 AM) No Known Drug Allergies  [09/26/2017]: Allergies Reconciled   Medication History Sabino Gasser, CMA; 10/29/2019 9:54 AM) AmLODIPine Besylate (5MG  Tablet, Oral) Active. Atorvastatin Calcium (10MG  Tablet, Oral) Active. HydroCHLOROthiazide (25MG  Tablet, Oral) Active. Medications Reconciled    Review of Systems Stark Klein MD; 10/28/2019 3:27 PM) All other systems negative  Vitals Sabino Gasser CMA; 10/29/2019 9:55 AM) 10/29/2019 9:54 AM Weight: 184  lb Height: 68in Body Surface Area: 1.97 m Body Mass Index:  27.98 kg/m  Temp.: 98.78F (Tympanic)  Pulse: 81 (Regular)  BP: 136/82(Sitting, Left Arm, Standard)       Physical Exam Stark Klein MD; 10/29/2019 10:19 AM) General Mental Status-Alert. General Appearance-Consistent with stated age. Hydration-Well hydrated. Voice-Normal.  Head and Neck Head-normocephalic, atraumatic with no lesions or palpable masses.  Eye Sclera/Conjunctiva - Bilateral-No scleral icterus.  Chest and Lung Exam Chest and lung exam reveals -quiet, even and easy respiratory effort with no use of accessory muscles. Inspection Chest Wall - Normal. Back - normal.  Breast Note: left breast is slightly smaller. minimal hyperpigmentation at this point. Anticipated thickening in the upper outer quadrant at lumpectomy site. No palpable masses. no nipple retraction or nipple discharge. No LAD. right side non tender. biopsy site around 12 o'clock on right breast. scattered breast tissue. moderate ptosis. breast size roughly equivalent.   Cardiovascular Cardiovascular examination reveals -normal pedal pulses bilaterally. Note: regular rate and rhythm  Abdomen Inspection-Inspection Normal. Palpation/Percussion Palpation and Percussion of the abdomen reveal - Soft, Non Tender, No Rebound tenderness, No Rigidity (guarding) and No hepatosplenomegaly.  Peripheral Vascular Upper Extremity Inspection - Bilateral - Normal - No Clubbing, No Cyanosis, No Edema, Pulses Intact. Lower Extremity Palpation - Edema - Bilateral - No edema - Bilateral.  Neurologic Neurologic evaluation reveals -alert and oriented x 3 with no impairment of recent or remote memory. Mental Status-Normal.  Musculoskeletal Global Assessment -Note: no gross deformities.  Normal Exam - Left-Upper Extremity Strength Normal and Lower Extremity Strength Normal. Normal Exam - Right-Upper Extremity Strength Normal and Lower Extremity Strength  Normal.  Lymphatic Head & Neck  General Head & Neck Lymphatics: Bilateral - Description - Normal. Axillary  General Axillary Region: Bilateral - Description - Normal. Tenderness - Non Tender.    Assessment & Plan Stark Klein MD; 10/29/2019 10:21 AM) MALIGNANT NEOPLASM OF UPPER-OUTER QUADRANT OF RIGHT BREAST IN FEMALE, ESTROGEN RECEPTOR POSITIVE (C50.411) Impression: This is stage 0 right breast DCIS.  this is amenable to lumpectomy. This would require seed. Will plan this. Will also need to see oncology and radiation back.  I reviewed risks again and the surgical procedure again. She understands and wishes to proceed. She plans on taking the antiestrogen tx this time. Current Plans You are being scheduled for surgery- Our schedulers will call you.  You should hear from our office's scheduling department within 5 working days about the location, date, and time of surgery. We try to make accommodations for patient's preferences in scheduling surgery, but sometimes the OR schedule or the surgeon's schedule prevents [place] from making those accommodations.  If you have not heard from our office [phone]) in 5 working days, call the office and ask for your surgeon's nurse.  If you have other questions about your diagnosis, plan, or surgery, call the office and ask for your surgeon's nurse.  Pt Education - [facility] breast cancer surgery: discussed with patient and provided information. MALIGNANT NEOPLASM OF UPPER-OUTER QUADRANT OF LEFT FEMALE BREAST, UNSPECIFIED ESTROGEN RECEPTOR STATUS (C50.412) Impression: Stage 0 left breast cancer.  No evidence of disease.  Mammogram due in next 3 months or so.  I will see in 6 months, then annually.    Signed electronically by Stark Klein, MD (10/29/2019 10:21 AM)

## 2019-11-18 ENCOUNTER — Ambulatory Visit (HOSPITAL_COMMUNITY): Payer: Medicare Other | Admitting: Anesthesiology

## 2019-11-18 ENCOUNTER — Other Ambulatory Visit: Payer: Self-pay

## 2019-11-18 ENCOUNTER — Ambulatory Visit
Admission: RE | Admit: 2019-11-18 | Discharge: 2019-11-18 | Disposition: A | Discharge status: Home / Self Care | Payer: Medicare Other | Source: Ambulatory Visit | Attending: General Surgery | Admitting: General Surgery

## 2019-11-18 ENCOUNTER — Encounter (HOSPITAL_COMMUNITY): Payer: Self-pay | Admitting: General Surgery

## 2019-11-18 ENCOUNTER — Ambulatory Visit (HOSPITAL_COMMUNITY)
Admission: RE | Admit: 2019-11-18 | Discharge: 2019-11-18 | Disposition: A | Discharge status: Home / Self Care | Payer: Medicare Other | Source: Ambulatory Visit | Attending: General Surgery | Admitting: General Surgery

## 2019-11-18 ENCOUNTER — Encounter (HOSPITAL_COMMUNITY)
Admission: RE | Disposition: A | Discharge status: Home / Self Care | Payer: Self-pay | Source: Ambulatory Visit | Attending: General Surgery

## 2019-11-18 DIAGNOSIS — Z853 Personal history of malignant neoplasm of breast: Secondary | ICD-10-CM | POA: Diagnosis not present

## 2019-11-18 DIAGNOSIS — Z90710 Acquired absence of both cervix and uterus: Secondary | ICD-10-CM | POA: Insufficient documentation

## 2019-11-18 DIAGNOSIS — Z79899 Other long term (current) drug therapy: Secondary | ICD-10-CM | POA: Insufficient documentation

## 2019-11-18 DIAGNOSIS — Z170 Estrogen receptor positive status [ER+]: Secondary | ICD-10-CM | POA: Diagnosis not present

## 2019-11-18 DIAGNOSIS — D0511 Intraductal carcinoma in situ of right breast: Secondary | ICD-10-CM | POA: Insufficient documentation

## 2019-11-18 DIAGNOSIS — Z923 Personal history of irradiation: Secondary | ICD-10-CM | POA: Diagnosis not present

## 2019-11-18 DIAGNOSIS — C50411 Malignant neoplasm of upper-outer quadrant of right female breast: Secondary | ICD-10-CM

## 2019-11-18 DIAGNOSIS — I10 Essential (primary) hypertension: Secondary | ICD-10-CM | POA: Insufficient documentation

## 2019-11-18 DIAGNOSIS — N6091 Unspecified benign mammary dysplasia of right breast: Secondary | ICD-10-CM | POA: Insufficient documentation

## 2019-11-18 HISTORY — PX: BREAST LUMPECTOMY WITH RADIOACTIVE SEED LOCALIZATION: SHX6424

## 2019-11-18 SURGERY — BREAST LUMPECTOMY WITH RADIOACTIVE SEED LOCALIZATION
Anesthesia: General | Site: Breast | Laterality: Right

## 2019-11-18 MED ORDER — FENTANYL CITRATE (PF) 250 MCG/5ML IJ SOLN
INTRAMUSCULAR | Status: AC
  Filled 2019-11-18: qty 5

## 2019-11-18 MED ORDER — DEXAMETHASONE SODIUM PHOSPHATE 4 MG/ML IJ SOLN
INTRAMUSCULAR | Status: DC | PRN
  Administered 2019-11-18: 5 mg via INTRAVENOUS

## 2019-11-18 MED ORDER — LIDOCAINE-EPINEPHRINE 1 %-1:100000 IJ SOLN
INTRAMUSCULAR | Status: AC
  Filled 2019-11-18: qty 1

## 2019-11-18 MED ORDER — FENTANYL CITRATE (PF) 100 MCG/2ML IJ SOLN
INTRAMUSCULAR | Status: DC | PRN
  Administered 2019-11-18 (×2): 50 ug via INTRAVENOUS

## 2019-11-18 MED ORDER — PHENYLEPHRINE 40 MCG/ML (10ML) SYRINGE FOR IV PUSH (FOR BLOOD PRESSURE SUPPORT)
PREFILLED_SYRINGE | INTRAVENOUS | Status: AC
  Filled 2019-11-18: qty 10

## 2019-11-18 MED ORDER — OXYCODONE HCL 5 MG PO TABS: 5.0000 mg | ORAL_TABLET | Freq: Four times a day (QID) | ORAL | 0 refills | Status: DC | PRN

## 2019-11-18 MED ORDER — PROMETHAZINE HCL 25 MG/ML IJ SOLN: 6.2500 mg | INTRAMUSCULAR | Status: DC | PRN

## 2019-11-18 MED ORDER — LIDOCAINE 2% (20 MG/ML) 5 ML SYRINGE
INTRAMUSCULAR | Status: AC
  Filled 2019-11-18: qty 5

## 2019-11-18 MED ORDER — CHLORHEXIDINE GLUCONATE 0.12 % MT SOLN
OROMUCOSAL | Status: AC
  Administered 2019-11-18: 15 mL via OROMUCOSAL
  Filled 2019-11-18: qty 15

## 2019-11-18 MED ORDER — PHENYLEPHRINE 40 MCG/ML (10ML) SYRINGE FOR IV PUSH (FOR BLOOD PRESSURE SUPPORT)
PREFILLED_SYRINGE | INTRAVENOUS | Status: DC | PRN
  Administered 2019-11-18: 40 ug via INTRAVENOUS
  Administered 2019-11-18 (×2): 80 ug via INTRAVENOUS

## 2019-11-18 MED ORDER — CHLORHEXIDINE GLUCONATE CLOTH 2 % EX PADS: 6.0000 | MEDICATED_PAD | Freq: Once | CUTANEOUS | Status: DC

## 2019-11-18 MED ORDER — ONDANSETRON HCL 4 MG/2ML IJ SOLN
INTRAMUSCULAR | Status: DC | PRN
  Administered 2019-11-18: 4 mg via INTRAVENOUS

## 2019-11-18 MED ORDER — 0.9 % SODIUM CHLORIDE (POUR BTL) OPTIME
TOPICAL | Status: DC | PRN
  Administered 2019-11-18: 1000 mL

## 2019-11-18 MED ORDER — CEFAZOLIN SODIUM-DEXTROSE 2-4 GM/100ML-% IV SOLN
2.0000 g | INTRAVENOUS | Status: AC
  Administered 2019-11-18: 2 g via INTRAVENOUS

## 2019-11-18 MED ORDER — PROPOFOL 10 MG/ML IV BOLUS
INTRAVENOUS | Status: AC
  Filled 2019-11-18: qty 20

## 2019-11-18 MED ORDER — CEFAZOLIN SODIUM-DEXTROSE 2-4 GM/100ML-% IV SOLN
INTRAVENOUS | Status: AC
  Filled 2019-11-18: qty 100

## 2019-11-18 MED ORDER — ENSURE PRE-SURGERY PO LIQD: 296.0000 mL | Freq: Once | ORAL | Status: DC

## 2019-11-18 MED ORDER — ACETAMINOPHEN 500 MG PO TABS: 1000.0000 mg | ORAL_TABLET | ORAL | Status: AC

## 2019-11-18 MED ORDER — ONDANSETRON HCL 4 MG/2ML IJ SOLN
INTRAMUSCULAR | Status: AC
  Filled 2019-11-18: qty 2

## 2019-11-18 MED ORDER — CHLORHEXIDINE GLUCONATE 0.12 % MT SOLN: 15.0000 mL | Freq: Once | OROMUCOSAL | Status: AC

## 2019-11-18 MED ORDER — BUPIVACAINE HCL (PF) 0.25 % IJ SOLN
INTRAMUSCULAR | Status: AC
  Filled 2019-11-18: qty 30

## 2019-11-18 MED ORDER — ACETAMINOPHEN 500 MG PO TABS
ORAL_TABLET | ORAL | Status: AC
  Administered 2019-11-18: 1000 mg via ORAL
  Filled 2019-11-18: qty 2

## 2019-11-18 MED ORDER — ORAL CARE MOUTH RINSE: 15.0000 mL | Freq: Once | OROMUCOSAL | Status: AC

## 2019-11-18 MED ORDER — PROPOFOL 10 MG/ML IV BOLUS
INTRAVENOUS | Status: DC | PRN
  Administered 2019-11-18: 30 mg via INTRAVENOUS
  Administered 2019-11-18: 170 mg via INTRAVENOUS

## 2019-11-18 MED ORDER — LACTATED RINGERS IV SOLN: INTRAVENOUS | Status: DC

## 2019-11-18 MED ORDER — DEXAMETHASONE SODIUM PHOSPHATE 10 MG/ML IJ SOLN
INTRAMUSCULAR | Status: AC
  Filled 2019-11-18: qty 1

## 2019-11-18 MED ORDER — LIDOCAINE-EPINEPHRINE 1 %-1:100000 IJ SOLN
INTRAMUSCULAR | Status: DC | PRN
  Administered 2019-11-18: 10 mL

## 2019-11-18 MED ORDER — FENTANYL CITRATE (PF) 100 MCG/2ML IJ SOLN: 25.0000 ug | INTRAMUSCULAR | Status: DC | PRN

## 2019-11-18 MED ORDER — LIDOCAINE 2% (20 MG/ML) 5 ML SYRINGE
INTRAMUSCULAR | Status: DC | PRN
  Administered 2019-11-18: 80 mg via INTRAVENOUS

## 2019-11-18 SURGICAL SUPPLY — 46 items
BINDER BREAST LRG (GAUZE/BANDAGES/DRESSINGS) IMPLANT
BINDER BREAST XLRG (GAUZE/BANDAGES/DRESSINGS) ×3 IMPLANT
BLADE SURG 10 STRL SS (BLADE) ×3 IMPLANT
CANISTER SUCT 3000ML PPV (MISCELLANEOUS) IMPLANT
CHLORAPREP W/TINT 26 (MISCELLANEOUS) ×3 IMPLANT
CLIP VESOCCLUDE LG 6/CT (CLIP) ×6 IMPLANT
CLIP VESOCCLUDE MED 6/CT (CLIP) ×3 IMPLANT
CLOSURE STERI-STRIP 1/4X4 (GAUZE/BANDAGES/DRESSINGS) ×3 IMPLANT
CLOSURE WOUND 1/2 X4 (GAUZE/BANDAGES/DRESSINGS) ×1
COVER PROBE W GEL 5X96 (DRAPES) ×3 IMPLANT
COVER SURGICAL LIGHT HANDLE (MISCELLANEOUS) ×3 IMPLANT
COVER WAND RF STERILE (DRAPES) ×3 IMPLANT
DERMABOND ADHESIVE PROPEN (GAUZE/BANDAGES/DRESSINGS) ×2
DERMABOND ADVANCED (GAUZE/BANDAGES/DRESSINGS) ×2
DERMABOND ADVANCED .7 DNX12 (GAUZE/BANDAGES/DRESSINGS) ×1 IMPLANT
DERMABOND ADVANCED .7 DNX6 (GAUZE/BANDAGES/DRESSINGS) ×1 IMPLANT
DEVICE DUBIN SPECIMEN MAMMOGRA (MISCELLANEOUS) ×3 IMPLANT
DRAPE CHEST BREAST 15X10 FENES (DRAPES) ×3 IMPLANT
DRSG PAD ABDOMINAL 8X10 ST (GAUZE/BANDAGES/DRESSINGS) ×3 IMPLANT
ELECT COATED BLADE 2.86 ST (ELECTRODE) ×3 IMPLANT
ELECT REM PT RETURN 9FT ADLT (ELECTROSURGICAL) ×3
ELECTRODE REM PT RTRN 9FT ADLT (ELECTROSURGICAL) ×1 IMPLANT
GAUZE SPONGE 4X4 12PLY STRL (GAUZE/BANDAGES/DRESSINGS) ×3 IMPLANT
GAUZE SPONGE 4X4 12PLY STRL LF (GAUZE/BANDAGES/DRESSINGS) ×3 IMPLANT
GLOVE BIO SURGEON STRL SZ 6 (GLOVE) ×3 IMPLANT
GLOVE INDICATOR 6.5 STRL GRN (GLOVE) ×3 IMPLANT
GOWN STRL REUS W/ TWL LRG LVL3 (GOWN DISPOSABLE) ×1 IMPLANT
GOWN STRL REUS W/TWL 2XL LVL3 (GOWN DISPOSABLE) ×3 IMPLANT
GOWN STRL REUS W/TWL LRG LVL3 (GOWN DISPOSABLE) ×3
KIT BASIN OR (CUSTOM PROCEDURE TRAY) ×3 IMPLANT
KIT MARKER MARGIN INK (KITS) ×3 IMPLANT
LIGHT WAVEGUIDE WIDE FLAT (MISCELLANEOUS) ×3 IMPLANT
NEEDLE HYPO 25GX1X1/2 BEV (NEEDLE) ×3 IMPLANT
NS IRRIG 1000ML POUR BTL (IV SOLUTION) IMPLANT
PACK GENERAL/GYN (CUSTOM PROCEDURE TRAY) ×3 IMPLANT
PAD ABD 8X10 STRL (GAUZE/BANDAGES/DRESSINGS) ×3 IMPLANT
STRIP CLOSURE SKIN 1/2X4 (GAUZE/BANDAGES/DRESSINGS) ×2 IMPLANT
SUT MNCRL AB 4-0 PS2 18 (SUTURE) ×3 IMPLANT
SUT SILK 2 0 SH (SUTURE) IMPLANT
SUT VIC AB 2-0 SH 27 (SUTURE) ×3
SUT VIC AB 2-0 SH 27XBRD (SUTURE) ×1 IMPLANT
SUT VIC AB 3-0 SH 27 (SUTURE) ×3
SUT VIC AB 3-0 SH 27X BRD (SUTURE) ×1 IMPLANT
SYR CONTROL 10ML LL (SYRINGE) ×3 IMPLANT
TOWEL GREEN STERILE (TOWEL DISPOSABLE) ×3 IMPLANT
TOWEL GREEN STERILE FF (TOWEL DISPOSABLE) ×3 IMPLANT

## 2019-11-18 NOTE — Op Note (Signed)
Right Breast Radioactive seed localized lumpectomy  Indications: This patient presents with history of right breast cancer, upper outer quadrant, cTis, receptors +/+  Pre-operative Diagnosis: right breast cancer  Post-operative Diagnosis: right breast cancer  Surgeon: Stark Klein   Anesthesia: General endotracheal anesthesia  ASA Class: 2  Procedure Details  The patient was seen in the Holding Room. The risks, benefits, complications, treatment options, and expected outcomes were discussed with the patient. The possibilities of bleeding, infection, the need for additional procedures, failure to diagnose a condition, and creating a complication requiring other procedures or operations were discussed with the patient. The patient concurred with the proposed plan, giving informed consent.  The site of surgery properly noted/marked. The patient was taken to Operating Room # 2, identified, and the procedure verified as Right breast seed localized lumpectomy.  The right breast and chest were prepped and draped in standard fashion. A superior circumareolar incision was made near the previously placed radioactive seed.  Dissection was carried down around the point of maximum signal intensity. The cautery was used to perform the dissection.   The specimen was inked with the margin marker paint kit.    Specimen radiography confirmed inclusion of the mammographic lesion, the clip, and the seed.  The background signal in the breast was zero.  Hemostasis was achieved with cautery.  The cavity was marked with clips on each border other than the anterior border.  The wound was irrigated and closed with 3-0 vicryl interrupted deep dermal sutures and 4-0 monocryl running subcuticular suture.      Sterile dressings were applied. At the end of the operation, all sponge, instrument, and needle counts were correct.   Findings: Seed, clip in specimen.  anterior margin is skin   Estimated Blood Loss:  min          Specimens: left breast tissue with seed         Complications:  None; patient tolerated the procedure well.         Disposition: PACU - hemodynamically stable.         Condition: stable

## 2019-11-18 NOTE — Anesthesia Procedure Notes (Signed)
Procedure Name: LMA Insertion Date/Time: 11/18/2019 3:27 PM Performed by: Trinna Post., CRNA Pre-anesthesia Checklist: Patient identified, Emergency Drugs available, Suction available, Patient being monitored and Timeout performed Patient Re-evaluated:Patient Re-evaluated prior to induction Oxygen Delivery Method: Circle system utilized Preoxygenation: Pre-oxygenation with 100% oxygen Induction Type: IV induction Ventilation: Mask ventilation without difficulty LMA: LMA inserted LMA Size: 4.0 Placement Confirmation: positive ETCO2

## 2019-11-18 NOTE — Transfer of Care (Signed)
Immediate Anesthesia Transfer of Care Note  Patient: Victoria Fuller  Procedure(s) Performed: RIGHT BREAST LUMPECTOMY WITH RADIOACTIVE SEED LOCALIZATION (Right Breast)  Patient Location: PACU  Anesthesia Type:General  Level of Consciousness: awake, alert  and oriented  Airway & Oxygen Therapy: Patient Spontanous Breathing and Patient connected to face mask oxygen  Post-op Assessment: Report given to RN and Post -op Vital signs reviewed and stable  Post vital signs: Reviewed and stable  Last Vitals:  Vitals Value Taken Time  BP    Temp    Pulse    Resp    SpO2      Last Pain:  Vitals:   11/18/19 1312  TempSrc: Oral         Complications: No complications documented.

## 2019-11-18 NOTE — Anesthesia Preprocedure Evaluation (Addendum)
Anesthesia Evaluation  Patient identified by MRN, date of birth, ID band Patient awake    Reviewed: Allergy & Precautions, NPO status , Patient's Chart, lab work & pertinent test results  Airway Mallampati: II  TM Distance: >3 FB Neck ROM: Full    Dental  (+) Teeth Intact, Dental Advisory Given   Pulmonary neg pulmonary ROS,    Pulmonary exam normal breath sounds clear to auscultation       Cardiovascular hypertension, Pt. on medications Normal cardiovascular exam Rhythm:Regular Rate:Normal     Neuro/Psych negative neurological ROS  negative psych ROS   GI/Hepatic negative GI ROS, Neg liver ROS,   Endo/Other  negative endocrine ROS  Renal/GU negative Renal ROS     Musculoskeletal negative musculoskeletal ROS (+)   Abdominal   Peds  Hematology  (+) Blood dyscrasia (Thrombocytopenia), ,   Anesthesia Other Findings Day of surgery medications reviewed with the patient.  Right breast cancer   Reproductive/Obstetrics                             Anesthesia Physical Anesthesia Plan  ASA: II  Anesthesia Plan: General   Post-op Pain Management:    Induction: Intravenous  PONV Risk Score and Plan: 3 and Midazolam, Dexamethasone and Ondansetron  Airway Management Planned: LMA  Additional Equipment:   Intra-op Plan:   Post-operative Plan: Extubation in OR  Informed Consent: I have reviewed the patients History and Physical, chart, labs and discussed the procedure including the risks, benefits and alternatives for the proposed anesthesia with the patient or authorized representative who has indicated his/her understanding and acceptance.     Dental advisory given  Plan Discussed with: CRNA  Anesthesia Plan Comments:        Anesthesia Quick Evaluation

## 2019-11-18 NOTE — Interval H&P Note (Signed)
History and Physical Interval Note:  11/18/2019 1:26 PM  Victoria Fuller  has presented today for surgery, with the diagnosis of RIGHT BREAST CANCER.  The various methods of treatment have been discussed with the patient and family. After consideration of risks, benefits and other options for treatment, the patient has consented to  Procedure(s): RIGHT BREAST LUMPECTOMY WITH RADIOACTIVE SEED LOCALIZATION (Right) as a surgical intervention.  The patient's history has been reviewed, patient examined, no change in status, stable for surgery.  I have reviewed the patient's chart and labs.  Questions were answered to the patient's satisfaction.     Stark Klein

## 2019-11-18 NOTE — Anesthesia Postprocedure Evaluation (Signed)
Anesthesia Post Note  Patient: Victoria Fuller  Procedure(s) Performed: RIGHT BREAST LUMPECTOMY WITH RADIOACTIVE SEED LOCALIZATION (Right Breast)     Patient location during evaluation: PACU Anesthesia Type: General Level of consciousness: awake and alert Pain management: pain level controlled Vital Signs Assessment: post-procedure vital signs reviewed and stable Respiratory status: spontaneous breathing, nonlabored ventilation, respiratory function stable and patient connected to nasal cannula oxygen Cardiovascular status: blood pressure returned to baseline and stable Postop Assessment: no apparent nausea or vomiting Anesthetic complications: no   No complications documented.  Last Vitals:  Vitals:   11/18/19 1709 11/18/19 1716  BP: (!) 145/76 (!) 145/90  Pulse: 78 81  Resp: 14 13  Temp:  (!) 36.1 C  SpO2: 100% 100%    Last Pain:  Vitals:   11/18/19 1716  TempSrc:   PainSc: 0-No pain                 Catalina Gravel

## 2019-11-19 ENCOUNTER — Encounter (HOSPITAL_COMMUNITY): Payer: Self-pay | Admitting: General Surgery

## 2019-11-22 ENCOUNTER — Encounter: Payer: Self-pay | Admitting: *Deleted

## 2019-11-22 LAB — SURGICAL PATHOLOGY

## 2019-11-29 ENCOUNTER — Other Ambulatory Visit: Payer: Self-pay | Admitting: General Surgery

## 2019-11-29 ENCOUNTER — Encounter: Payer: Self-pay | Admitting: *Deleted

## 2019-11-30 ENCOUNTER — Encounter: Payer: Self-pay | Admitting: *Deleted

## 2019-12-06 ENCOUNTER — Encounter: Payer: Self-pay | Admitting: *Deleted

## 2019-12-09 ENCOUNTER — Institutional Professional Consult (permissible substitution): Payer: Medicare Other | Admitting: Radiation Oncology

## 2019-12-09 ENCOUNTER — Ambulatory Visit: Payer: Medicare Other | Admitting: Radiation Oncology

## 2019-12-09 ENCOUNTER — Ambulatory Visit: Payer: Medicare Other

## 2019-12-13 ENCOUNTER — Encounter: Payer: Self-pay | Admitting: *Deleted

## 2019-12-18 ENCOUNTER — Other Ambulatory Visit (HOSPITAL_COMMUNITY)
Admission: RE | Admit: 2019-12-18 | Discharge: 2019-12-18 | Disposition: A | Discharge status: Home / Self Care | Payer: Medicare Other | Source: Ambulatory Visit | Attending: General Surgery | Admitting: General Surgery

## 2019-12-18 DIAGNOSIS — Z20822 Contact with and (suspected) exposure to covid-19: Secondary | ICD-10-CM | POA: Insufficient documentation

## 2019-12-18 DIAGNOSIS — Z01812 Encounter for preprocedural laboratory examination: Secondary | ICD-10-CM | POA: Insufficient documentation

## 2019-12-18 LAB — SARS CORONAVIRUS 2 (TAT 6-24 HRS): SARS Coronavirus 2: NEGATIVE

## 2019-12-20 NOTE — H&P (Signed)
Victoria Fuller is an 69 y.o. female.   Chief Complaint: right breast cancer HPI:  Pt is a 69 yo F with right breast cancer with positive margins.  She did well with original surgery.    Past Medical History:  Diagnosis Date  . Cancer ([place]) 08/2017   Left breast cancer  . Hyperlipidemia   . Hypertension   . Wears contact lenses     Past Surgical History:  Procedure Laterality Date  . ABDOMINAL HYSTERECTOMY    . BREAST LUMPECTOMY Left 2019  . BREAST LUMPECTOMY WITH RADIOACTIVE SEED LOCALIZATION Left 09/10/2017   Procedure: BREAST LUMPECTOMY WITH RADIOACTIVE SEED LOCALIZATION;  Surgeon: Stark Klein, MD;  Location: [facility];  Service: General;  Laterality: Left;  . BREAST LUMPECTOMY WITH RADIOACTIVE SEED LOCALIZATION Right 11/18/2019   Procedure: RIGHT BREAST LUMPECTOMY WITH RADIOACTIVE SEED LOCALIZATION;  Surgeon: Stark Klein, MD;  Location: [facility];  Service: General;  Laterality: Right;  . COLONOSCOPY W/ BIOPSIES AND POLYPECTOMY    . DIAGNOSTIC LAPAROSCOPY     uterine polyps  . WISDOM TOOTH EXTRACTION      Family History  Problem Relation Age of Onset  . Cancer Mother 24       ovarian cancer   . Prostate cancer Father    Social History:  reports that she has never smoked. She has never used smokeless tobacco. She reports current alcohol use. She reports that she does not use drugs.  Allergies: No Known Allergies  No medications prior to admission.    No results found for this or any previous visit (from the past 48 hour(s)). No results found.  Review of Systems  All other systems reviewed and are negative.   There were no vitals taken for this visit. Physical Exam Constitutional:      Appearance: Normal appearance.  HENT:     Head: Normocephalic and atraumatic.  Eyes:     Extraocular Movements: Extraocular movements intact.     Pupils: Pupils are equal, round, and reactive to light.  Cardiovascular:     Rate and Rhythm: Normal rate and regular  rhythm.     Pulses: Normal pulses.  Abdominal:     General: Abdomen is flat.  Skin:    General: Skin is warm and dry.     Capillary Refill: Capillary refill takes 2 to 3 seconds.  Neurological:     General: No focal deficit present.     Mental Status: She is alert and oriented to person, place, and time.  Psychiatric:        Mood and Affect: Mood normal.      Assessment/Plan Right breast cancer with positive lateral margin s/p lumpectomy for stage 0 right breast cancer  Plan reexcision lumpectomy  Stark Klein, MD 12/20/2019, 1:58 PM

## 2019-12-21 ENCOUNTER — Encounter (HOSPITAL_COMMUNITY): Payer: Self-pay | Admitting: General Surgery

## 2019-12-21 ENCOUNTER — Other Ambulatory Visit: Payer: Self-pay

## 2019-12-21 NOTE — Progress Notes (Signed)
Patient was given pre-op instructions over the phone. The opportunity was given for the patient to ask questions. No further questions asked. Patient verbalized understanding of instructions given.   Nothing to eat after midnight.  Patient was instructed to drink clear liquids until 0600 am the morning of surgery.  Patient is going to come by today at 0930 to pick up pre-surgery ensure   Patient stopped aspirin over a week ago  7 days prior to surgery STOP taking any Aspirin (unless otherwise instructed by your surgeon), Aleve, Naproxen, Ibuprofen, Motrin, Advil, Goody's, BC's, all herbal medications, fish oil, and all vitamins.   Anesthesia Review Needed: NO

## 2019-12-21 NOTE — Anesthesia Preprocedure Evaluation (Addendum)
Anesthesia Evaluation  Patient identified by MRN, date of birth, ID band Patient awake    Reviewed: Allergy & Precautions, NPO status , Patient's Chart, lab work & pertinent test results  Airway Mallampati: II  TM Distance: >3 FB Neck ROM: Full    Dental no notable dental hx. (+) Teeth Intact, Dental Advisory Given   Pulmonary neg pulmonary ROS,    Pulmonary exam normal breath sounds clear to auscultation       Cardiovascular hypertension, Pt. on medications Normal cardiovascular exam Rhythm:Regular Rate:Normal     Neuro/Psych negative neurological ROS  negative psych ROS   GI/Hepatic negative GI ROS, Neg liver ROS,   Endo/Other  negative endocrine ROS  Renal/GU negative Renal ROS  negative genitourinary   Musculoskeletal negative musculoskeletal ROS (+)   Abdominal   Peds  Hematology negative hematology ROS (+)   Anesthesia Other Findings Right breast ca  Reproductive/Obstetrics negative OB ROS                            Anesthesia Physical Anesthesia Plan  ASA: II  Anesthesia Plan: General   Post-op Pain Management:    Induction: Intravenous  PONV Risk Score and Plan: 3 and Ondansetron, Dexamethasone, Midazolam and Treatment may vary due to age or medical condition  Airway Management Planned: LMA  Additional Equipment: None  Intra-op Plan:   Post-operative Plan: Extubation in OR  Informed Consent: I have reviewed the patients History and Physical, chart, labs and discussed the procedure including the risks, benefits and alternatives for the proposed anesthesia with the patient or authorized representative who has indicated his/her understanding and acceptance.     Dental advisory given  Plan Discussed with: CRNA  Anesthesia Plan Comments:        Anesthesia Quick Evaluation

## 2019-12-22 ENCOUNTER — Ambulatory Visit (HOSPITAL_COMMUNITY): Payer: Medicare Other | Admitting: Anesthesiology

## 2019-12-22 ENCOUNTER — Encounter (HOSPITAL_COMMUNITY): Payer: Self-pay | Admitting: General Surgery

## 2019-12-22 ENCOUNTER — Encounter (HOSPITAL_COMMUNITY)
Admission: RE | Disposition: A | Discharge status: Home / Self Care | Payer: Self-pay | Source: Home / Self Care | Attending: General Surgery

## 2019-12-22 ENCOUNTER — Ambulatory Visit (HOSPITAL_COMMUNITY)
Admission: RE | Admit: 2019-12-22 | Discharge: 2019-12-22 | Disposition: A | Discharge status: Home / Self Care | Payer: Medicare Other | Attending: General Surgery | Admitting: General Surgery

## 2019-12-22 DIAGNOSIS — N6011 Diffuse cystic mastopathy of right breast: Secondary | ICD-10-CM | POA: Insufficient documentation

## 2019-12-22 DIAGNOSIS — C50911 Malignant neoplasm of unspecified site of right female breast: Secondary | ICD-10-CM | POA: Insufficient documentation

## 2019-12-22 DIAGNOSIS — I10 Essential (primary) hypertension: Secondary | ICD-10-CM | POA: Diagnosis not present

## 2019-12-22 HISTORY — PX: RE-EXCISION OF BREAST LUMPECTOMY: SHX6048

## 2019-12-22 LAB — COMPREHENSIVE METABOLIC PANEL
ALT: 36 U/L (ref 0–44)
AST: 26 U/L (ref 15–41)
Albumin: 4.4 g/dL (ref 3.5–5.0)
Alkaline Phosphatase: 64 U/L (ref 38–126)
Anion gap: 10 (ref 5–15)
BUN: 9 mg/dL (ref 8–23)
CO2: 27 mmol/L (ref 22–32)
Calcium: 9.6 mg/dL (ref 8.9–10.3)
Chloride: 104 mmol/L (ref 98–111)
Creatinine, Ser: 0.71 mg/dL (ref 0.44–1.00)
GFR calc Af Amer: >60 mL/min (ref >60)
GFR calc non Af Amer: >60 mL/min (ref >60)
Glucose, Bld: 94 mg/dL (ref 70–99)
Potassium: 3 mmol/L — ABNORMAL LOW (ref 3.5–5.1)
Sodium: 141 mmol/L (ref 135–145)
Total Bilirubin: 1.8 mg/dL — ABNORMAL HIGH (ref 0.3–1.2)
Total Protein: 6.8 g/dL (ref 6.5–8.1)

## 2019-12-22 LAB — CBC
HCT: 39.5 % (ref 36.0–46.0)
Hemoglobin: 13 g/dL (ref 12.0–15.0)
MCH: 29.1 pg (ref 26.0–34.0)
MCHC: 32.9 g/dL (ref 30.0–36.0)
MCV: 88.4 fL (ref 80.0–100.0)
Platelets: 148 10*3/uL — ABNORMAL LOW (ref 150–400)
RBC: 4.47 MIL/uL (ref 3.87–5.11)
RDW: 13 % (ref 11.5–15.5)
WBC: 3.1 10*3/uL — ABNORMAL LOW (ref 4.0–10.5)
nRBC: 0 % (ref 0.0–0.2)

## 2019-12-22 SURGERY — EXCISION, LESION, BREAST
Anesthesia: General | Site: Breast | Laterality: Right

## 2019-12-22 MED ORDER — 0.9 % SODIUM CHLORIDE (POUR BTL) OPTIME
TOPICAL | Status: DC | PRN
  Administered 2019-12-22: 1000 mL

## 2019-12-22 MED ORDER — DEXAMETHASONE SODIUM PHOSPHATE 10 MG/ML IJ SOLN
INTRAMUSCULAR | Status: DC | PRN
  Administered 2019-12-22: 10 mg via INTRAVENOUS

## 2019-12-22 MED ORDER — OXYCODONE HCL 5 MG PO TABS
ORAL_TABLET | ORAL | Status: AC
  Filled 2019-12-22: qty 1

## 2019-12-22 MED ORDER — BUPIVACAINE HCL (PF) 0.25 % IJ SOLN
INTRAMUSCULAR | Status: AC
  Filled 2019-12-22: qty 30

## 2019-12-22 MED ORDER — PROPOFOL 10 MG/ML IV BOLUS
INTRAVENOUS | Status: AC
  Filled 2019-12-22: qty 20

## 2019-12-22 MED ORDER — LIDOCAINE-EPINEPHRINE 1 %-1:100000 IJ SOLN
INTRAMUSCULAR | Status: AC
  Filled 2019-12-22: qty 1

## 2019-12-22 MED ORDER — CHLORHEXIDINE GLUCONATE CLOTH 2 % EX PADS: 6.0000 | MEDICATED_PAD | Freq: Once | CUTANEOUS | Status: DC

## 2019-12-22 MED ORDER — OXYCODONE HCL 5 MG PO TABS
5.0000 mg | ORAL_TABLET | Freq: Once | ORAL | Status: AC | PRN
  Administered 2019-12-22: 5 mg via ORAL

## 2019-12-22 MED ORDER — FENTANYL CITRATE (PF) 100 MCG/2ML IJ SOLN
INTRAMUSCULAR | Status: DC | PRN
Start: 2019-12-22 — End: 2019-12-22
  Administered 2019-12-22: 50 ug via INTRAVENOUS
  Administered 2019-12-22: 100 ug via INTRAVENOUS

## 2019-12-22 MED ORDER — LIDOCAINE 2% (20 MG/ML) 5 ML SYRINGE
INTRAMUSCULAR | Status: DC | PRN
  Administered 2019-12-22: 60 mg via INTRAVENOUS

## 2019-12-22 MED ORDER — ACETAMINOPHEN 500 MG PO TABS
1000.0000 mg | ORAL_TABLET | Freq: Once | ORAL | Status: AC
  Administered 2019-12-22: 1000 mg via ORAL
  Filled 2019-12-22: qty 2

## 2019-12-22 MED ORDER — ENSURE PRE-SURGERY PO LIQD: 296.0000 mL | Freq: Once | ORAL | Status: DC

## 2019-12-22 MED ORDER — OXYCODONE HCL 5 MG/5ML PO SOLN: 5.0000 mg | Freq: Once | ORAL | Status: AC | PRN

## 2019-12-22 MED ORDER — PROMETHAZINE HCL 25 MG/ML IJ SOLN: 6.2500 mg | INTRAMUSCULAR | Status: DC | PRN

## 2019-12-22 MED ORDER — LIDOCAINE-EPINEPHRINE 1 %-1:100000 IJ SOLN
INTRAMUSCULAR | Status: DC | PRN
  Administered 2019-12-22: 20 mL

## 2019-12-22 MED ORDER — EPHEDRINE SULFATE 50 MG/ML IJ SOLN
INTRAMUSCULAR | Status: DC | PRN
  Administered 2019-12-22 (×4): 10 mg via INTRAVENOUS

## 2019-12-22 MED ORDER — LACTATED RINGERS IV SOLN: INTRAVENOUS | Status: DC

## 2019-12-22 MED ORDER — FENTANYL CITRATE (PF) 250 MCG/5ML IJ SOLN
INTRAMUSCULAR | Status: AC
  Filled 2019-12-22: qty 5

## 2019-12-22 MED ORDER — CHLORHEXIDINE GLUCONATE 0.12 % MT SOLN
15.0000 mL | Freq: Once | OROMUCOSAL | Status: AC
  Administered 2019-12-22: 15 mL via OROMUCOSAL
  Filled 2019-12-22: qty 15

## 2019-12-22 MED ORDER — BUPIVACAINE HCL (PF) 0.25 % IJ SOLN
INTRAMUSCULAR | Status: DC | PRN
  Administered 2019-12-22: 20 mL

## 2019-12-22 MED ORDER — CEFAZOLIN SODIUM-DEXTROSE 2-4 GM/100ML-% IV SOLN
2.0000 g | INTRAVENOUS | Status: AC
  Administered 2019-12-22: 2 g via INTRAVENOUS
  Filled 2019-12-22: qty 100

## 2019-12-22 MED ORDER — PROPOFOL 10 MG/ML IV BOLUS
INTRAVENOUS | Status: DC | PRN
  Administered 2019-12-22: 200 mg via INTRAVENOUS

## 2019-12-22 MED ORDER — ORAL CARE MOUTH RINSE: 15.0000 mL | Freq: Once | OROMUCOSAL | Status: AC

## 2019-12-22 MED ORDER — HYDROMORPHONE HCL 1 MG/ML IJ SOLN
0.2500 mg | INTRAMUSCULAR | Status: DC | PRN
  Administered 2019-12-22: 0.5 mg via INTRAVENOUS

## 2019-12-22 MED ORDER — ONDANSETRON HCL 4 MG/2ML IJ SOLN
INTRAMUSCULAR | Status: DC | PRN
  Administered 2019-12-22: 4 mg via INTRAVENOUS

## 2019-12-22 MED ORDER — HYDROMORPHONE HCL 1 MG/ML IJ SOLN
INTRAMUSCULAR | Status: AC
  Filled 2019-12-22: qty 1

## 2019-12-22 SURGICAL SUPPLY — 40 items
BINDER BREAST LRG (GAUZE/BANDAGES/DRESSINGS) ×2 IMPLANT
BINDER BREAST XLRG (GAUZE/BANDAGES/DRESSINGS) IMPLANT
CANISTER SUCT 3000ML PPV (MISCELLANEOUS) ×2 IMPLANT
CHLORAPREP W/TINT 26 (MISCELLANEOUS) ×2 IMPLANT
CLIP VESOCCLUDE LG 6/CT (CLIP) IMPLANT
CLOSURE STERI-STRIP 1/4X4 (GAUZE/BANDAGES/DRESSINGS) ×2 IMPLANT
CNTNR URN SCR LID CUP LEK RST (MISCELLANEOUS) ×1 IMPLANT
CONT SPEC 4OZ STRL OR WHT (MISCELLANEOUS) ×2
COVER SURGICAL LIGHT HANDLE (MISCELLANEOUS) ×2 IMPLANT
COVER WAND RF STERILE (DRAPES) ×2 IMPLANT
DECANTER SPIKE VIAL GLASS SM (MISCELLANEOUS) ×2 IMPLANT
DERMABOND ADVANCED (GAUZE/BANDAGES/DRESSINGS) ×1
DERMABOND ADVANCED .7 DNX12 (GAUZE/BANDAGES/DRESSINGS) ×1 IMPLANT
DRAPE CHEST BREAST 15X10 FENES (DRAPES) ×2 IMPLANT
DRSG PAD ABDOMINAL 8X10 ST (GAUZE/BANDAGES/DRESSINGS) ×2 IMPLANT
ELECT REM PT RETURN 9FT ADLT (ELECTROSURGICAL) ×2
ELECTRODE REM PT RTRN 9FT ADLT (ELECTROSURGICAL) ×1 IMPLANT
GAUZE SPONGE 4X4 12PLY STRL (GAUZE/BANDAGES/DRESSINGS) ×2 IMPLANT
GLOVE BIO SURGEON STRL SZ 6 (GLOVE) ×2 IMPLANT
GLOVE INDICATOR 6.5 STRL GRN (GLOVE) ×2 IMPLANT
GOWN STRL REUS W/ TWL LRG LVL3 (GOWN DISPOSABLE) ×1 IMPLANT
GOWN STRL REUS W/TWL 2XL LVL3 (GOWN DISPOSABLE) ×2 IMPLANT
GOWN STRL REUS W/TWL LRG LVL3 (GOWN DISPOSABLE) ×2
KIT BASIN OR (CUSTOM PROCEDURE TRAY) ×2 IMPLANT
KIT MARKER MARGIN INK (KITS) ×2 IMPLANT
KIT TURNOVER KIT B (KITS) ×2 IMPLANT
LIGHT WAVEGUIDE WIDE FLAT (MISCELLANEOUS) ×2 IMPLANT
NEEDLE HYPO 25GX1X1/2 BEV (NEEDLE) ×2 IMPLANT
NS IRRIG 1000ML POUR BTL (IV SOLUTION) ×2 IMPLANT
PACK GENERAL/GYN (CUSTOM PROCEDURE TRAY) ×2 IMPLANT
PAD ARMBOARD 7.5X6 YLW CONV (MISCELLANEOUS) ×2 IMPLANT
PENCIL SMOKE EVACUATOR (MISCELLANEOUS) ×2 IMPLANT
STRIP CLOSURE SKIN 1/2X4 (GAUZE/BANDAGES/DRESSINGS) ×2 IMPLANT
SUT MNCRL AB 4-0 PS2 18 (SUTURE) ×2 IMPLANT
SUT SILK 2 0 PERMA HAND 18 BK (SUTURE) IMPLANT
SUT VIC AB 3-0 SH 27 (SUTURE) ×2
SUT VIC AB 3-0 SH 27X BRD (SUTURE) ×1 IMPLANT
SYR CONTROL 10ML LL (SYRINGE) ×2 IMPLANT
TOWEL GREEN STERILE (TOWEL DISPOSABLE) ×2 IMPLANT
TOWEL GREEN STERILE FF (TOWEL DISPOSABLE) ×2 IMPLANT

## 2019-12-22 NOTE — Anesthesia Postprocedure Evaluation (Signed)
Anesthesia Post Note  Patient: Victoria Fuller  Procedure(s) Performed: RIGHT BREAST RE-EXCISION OF BREAST LUMPECTOMY (Right Breast)     Patient location during evaluation: PACU Anesthesia Type: General Level of consciousness: awake and alert, oriented and patient cooperative Pain management: pain level controlled Vital Signs Assessment: post-procedure vital signs reviewed and stable Respiratory status: spontaneous breathing, nonlabored ventilation and respiratory function stable Cardiovascular status: blood pressure returned to baseline and stable Postop Assessment: no apparent nausea or vomiting Anesthetic complications: no   No complications documented.  Last Vitals:  Vitals:   12/22/19 0642 12/22/19 1050  BP: (!) 157/90 138/75  Pulse: 71 94  Resp: 17 12  Temp: 36.9 C (!) 36.2 C  SpO2: 99% 100%    Last Pain:  Vitals:   12/22/19 1050  TempSrc:   PainSc: [place]

## 2019-12-22 NOTE — Progress Notes (Signed)
Wasted .5mg  of Diluadid in stericycle with Orie Fisherman, RN.

## 2019-12-22 NOTE — Discharge Instructions (Addendum)
[facility] °Office Phone Number [phone] ° °BREAST BIOPSY/ PARTIAL MASTECTOMY: POST OP INSTRUCTIONS ° °Always review your discharge instruction sheet given to you by the facility where your surgery was performed. ° °IF YOU HAVE DISABILITY OR FAMILY LEAVE FORMS, YOU MUST BRING THEM TO THE OFFICE FOR PROCESSING.  DO NOT GIVE THEM TO YOUR DOCTOR. ° °1. A prescription for pain medication may be given to you upon discharge.  Take your pain medication as prescribed, if needed.  If narcotic pain medicine is not needed, then you may take acetaminophen (Tylenol) or ibuprofen (Advil) as needed. °2. Take your usually prescribed medications unless otherwise directed °3. If you need a refill on your pain medication, please contact your pharmacy.  They will contact our office to request authorization.  Prescriptions will not be filled after 5pm or on week-ends. °4. You should eat very light the first 24 hours after surgery, such as soup, crackers, pudding, etc.  Resume your normal diet the day after surgery. °5. Most patients will experience some swelling and bruising in the breast.  Ice packs and a good support bra will help.  Swelling and bruising can take several days to resolve.  °6. It is common to experience some constipation if taking pain medication after surgery.  Increasing fluid intake and taking a stool softener will usually help or prevent this problem from occurring.  A mild laxative (Milk of Magnesia or Miralax) should be taken according to package directions if there are no bowel movements after 48 hours. °7. Unless discharge instructions indicate otherwise, you may remove your bandages 48 hours after surgery, and you may shower at that time.  You may have steri-strips (small skin tapes) in place directly over the incision.  These strips should be left on the skin for 7-10 days.   Any sutures or staples will be removed at the office during your follow-up visit. °8. ACTIVITIES:  You may resume  regular daily activities (gradually increasing) beginning the next day.  Wearing a good support bra or sports bra (or the breast binder) minimizes pain and swelling.  You may have sexual intercourse when it is comfortable. °a. You may drive when you no longer are taking prescription pain medication, you can comfortably wear a seatbelt, and you can safely maneuver your car and apply brakes. °b. RETURN TO WORK:  __________1 week_______________ °9. You should see your doctor in the office for a follow-up appointment approximately two weeks after your surgery.  Your doctor’s nurse will typically make your follow-up appointment when she calls you with your pathology report.  Expect your pathology report 2-3 business days after your surgery.  You may call to check if you do not hear from us after three days. ° ° °WHEN TO CALL YOUR DOCTOR: °1. Fever over 101.0 °2. Nausea and/or vomiting. °3. Extreme swelling or bruising. °4. Continued bleeding from incision. °5. Increased pain, redness, or drainage from the incision. ° °The clinic staff is available to answer your questions during regular business hours.  Please don’t hesitate to call and ask to speak to one of the nurses for clinical concerns.  If you have a medical emergency, go to the nearest emergency room or call 911.  A surgeon from [facility] is always on call at the hospital. ° °For further questions, please visit [email or web address]  ° °

## 2019-12-22 NOTE — Op Note (Signed)
Re-excisional right Breast Lumpectomy   Indications: This patient presents with history of positive margins after partial mastectomy for right breast cancer   Pre-operative Diagnosis: right breast cancer   Post-operative Diagnosis: right breast cancer   Surgeon: Stark Klein   Assistants: n/a   Anesthesia: General anesthesia and Local anesthesia   ASA Class: 2   Procedure Details  The patient was seen in the Holding Room. The risks, benefits, complications, treatment options, and expected outcomes were discussed with the patient. The possibilities of reaction to medication, pulmonary aspiration, bleeding, infection, the need for additional procedures, failure to diagnose a condition, and creating a complication requiring transfusion or operation were discussed with the patient. The patient concurred with the proposed plan, giving informed consent. The site of surgery properly noted/marked. The patient was taken to Operating Room # 1, identified, and the procedure verified as re-excision of right breast cancer.  After induction of anesthesia, the right breast and chest were prepped and draped in standard fashion.  The lumpectomy was performed by reopening the prior incision. Seroma was aspirated. The mastopexy sutures were removed. Additional margins were taken at the lateral border of the partial mastectomy cavity. Dissection was carried down to the pectoral fascia. The margin was painted with the margin marker paint kit.   Hemostasis was achieved with cautery. The wound was irrigated and closed with a 3-0 Vicryl deep dermal interrupted and a 4-0 Monocryl subcuticular closure in layers.  Sterile dressings were applied. At the end of the operation, all sponge, instrument, and needle counts were correct.   Findings:  grossly clear surgical margins  Estimated Blood Loss: Minimal   Drains: none   Specimens: additional lateral margin  Complications: None; patient tolerated the procedure  well.   Disposition: PACU - hemodynamically stable.   Condition: stable

## 2019-12-22 NOTE — Interval H&P Note (Signed)
History and Physical Interval Note:  12/22/2019 7:41 AM  Victoria Fuller  has presented today for surgery, with the diagnosis of RIGHT BREAST CANCER.  The various methods of treatment have been discussed with the patient and family. After consideration of risks, benefits and other options for treatment, the patient has consented to  Procedure(s): RIGHT BREAST RE-EXCISION OF BREAST LUMPECTOMY (Right) as a surgical intervention.  The patient's history has been reviewed, patient examined, no change in status, stable for surgery.  I have reviewed the patient's chart and labs.  Questions were answered to the patient's satisfaction.     Stark Klein

## 2019-12-22 NOTE — Transfer of Care (Signed)
Immediate Anesthesia Transfer of Care Note  Patient: Victoria Fuller  Procedure(s) Performed: RIGHT BREAST RE-EXCISION OF BREAST LUMPECTOMY (Right Breast)  Patient Location: PACU  Anesthesia Type:General  Level of Consciousness: awake, alert  and oriented  Airway & Oxygen Therapy: Patient Spontanous Breathing and Patient connected to face mask oxygen  Post-op Assessment: Report given to RN and Post -op Vital signs reviewed and stable  Post vital signs: Reviewed and stable  Last Vitals:  Vitals Value Taken Time  BP 138/75 12/22/19 1051  Temp    Pulse 86 12/22/19 1053  Resp 14 12/22/19 1053  SpO2 100 % 12/22/19 1053  Vitals shown include unvalidated device data.  Last Pain:  Vitals:   12/22/19 0704  TempSrc:   PainSc: 0-No pain         Complications: No complications documented.

## 2019-12-22 NOTE — Anesthesia Procedure Notes (Signed)
Procedure Name: LMA Insertion Date/Time: 12/22/2019 9:41 AM Performed by: Babs Bertin, CRNA Pre-anesthesia Checklist: Patient identified, Emergency Drugs available, Suction available and Patient being monitored Patient Re-evaluated:Patient Re-evaluated prior to induction Oxygen Delivery Method: Circle System Utilized Preoxygenation: Pre-oxygenation with 100% oxygen Induction Type: IV induction Ventilation: Mask ventilation without difficulty LMA: LMA inserted LMA Size: 4.0 Number of attempts: 1 Airway Equipment and Method: Bite block Placement Confirmation: positive ETCO2 Tube secured with: Tape Dental Injury: Teeth and Oropharynx as per pre-operative assessment

## 2019-12-23 ENCOUNTER — Encounter (HOSPITAL_COMMUNITY): Payer: Self-pay | Admitting: General Surgery

## 2019-12-23 LAB — SURGICAL PATHOLOGY

## 2019-12-27 ENCOUNTER — Encounter: Payer: Self-pay | Admitting: *Deleted

## 2020-01-12 ENCOUNTER — Ambulatory Visit: Payer: Medicare Other

## 2020-01-12 ENCOUNTER — Ambulatory Visit: Payer: Medicare Other | Admitting: Radiation Oncology

## 2020-01-13 ENCOUNTER — Other Ambulatory Visit: Payer: Self-pay

## 2020-01-13 ENCOUNTER — Ambulatory Visit
Admission: RE | Admit: 2020-01-13 | Discharge: 2020-01-13 | Disposition: A | Discharge status: Home / Self Care | Payer: Medicare Other | Source: Ambulatory Visit | Attending: Radiation Oncology | Admitting: Radiation Oncology

## 2020-01-13 ENCOUNTER — Encounter: Payer: Self-pay | Admitting: *Deleted

## 2020-01-13 ENCOUNTER — Encounter: Payer: Self-pay | Admitting: Radiation Oncology

## 2020-01-13 DIAGNOSIS — D0511 Intraductal carcinoma in situ of right breast: Secondary | ICD-10-CM

## 2020-01-13 NOTE — Progress Notes (Signed)
Radiation Oncology         (336) [phone] ________________________________  Initial Outpatient Consultation - Conducted via telephone due to current COVID-19 concerns for limiting patient exposure  I spoke with the patient to conduct this consult visit via telephone to spare the patient unnecessary potential exposure in the healthcare setting during the current COVID-19 pandemic. The patient was notified in advance and was offered a [place] meeting to allow for face to face communication but unfortunately reported that they did not have the appropriate resources/technology to support such a visit and instead preferred to proceed with a telephone consult.     Name: Victoria Fuller        MRN: [id number]  Date of Service: 01/13/2020 DOB: [birth year]  CC:Sun, Gari Crown, MD  Truitt Merle, MD     REFERRING PHYSICIAN: Truitt Merle, MD   DIAGNOSIS: The encounter diagnosis was Ductal carcinoma in situ (DCIS) of right breast.   HISTORY OF PRESENT ILLNESS: Victoria Fuller is a 69 y.o. 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  She proceeded with lumpectomy of the left breast followed by adjuvant radiotherapy which she completed in July 2019.  Unfortunately the patient declined antiestrogen therapy in the adjuvant setting. She was proceeding with routine mammography in surveillance and in June 2021, was found to have a 7 mm group of calcifications at the 12 o'clock position.  A biopsy on 10/06/2019 revealed intermediate grade DCIS that was ER/PR positive.  She met with Dr. Barry Dienes and underwent surgical resection with lumpectomy of the right breast on 11/18/2019, DCIS with calcifications that was felt to be intermediate grade with focal necrosis was present measuring 1.3 cm without evidence of invasion.  DCIS involve the lateral margin and was 3 mm from the posterior margin, and associated atypical ductal hyperplasia was also seen.  She underwent reexcision of her margins on  12/22/2019, this revealed fibrocystic change without evidence of malignancy.  She is contacted by phone today to discuss treatment recommendations to the right breast.     PREVIOUS RADIATION THERAPY:   10/08/17 - 11/06/17: The patient initially received a dose of 42.56 Gy in 16 fractions to the left breast using whole-breast tangent fields. This was delivered using a 3-D conformal technique. The patient then received a boost to the seroma. This delivered an additional 8 Gy in 4 fractions using a 3 field photon technique due to the depth of the seroma. The total dose was 50.56 Gy.   PAST MEDICAL HISTORY:  Past Medical History:  Diagnosis Date  . Cancer ([facility]) 08/2017   Left breast cancer  . Hyperlipidemia   . Hypertension   . Wears contact lenses        PAST SURGICAL HISTORY: Past Surgical History:  Procedure Laterality Date  . ABDOMINAL HYSTERECTOMY    . BREAST LUMPECTOMY Left 2019  . BREAST LUMPECTOMY WITH RADIOACTIVE SEED LOCALIZATION Left 09/10/2017   Procedure: BREAST LUMPECTOMY WITH RADIOACTIVE SEED LOCALIZATION;  Surgeon: Stark Klein, MD;  Location: [facility];  Service: General;  Laterality: Left;  . BREAST LUMPECTOMY WITH RADIOACTIVE SEED LOCALIZATION Right 11/18/2019   Procedure: RIGHT BREAST LUMPECTOMY WITH RADIOACTIVE SEED LOCALIZATION;  Surgeon: Stark Klein, MD;  Location: [facility];  Service: General;  Laterality: Right;  . COLONOSCOPY W/ BIOPSIES AND POLYPECTOMY    . DIAGNOSTIC LAPAROSCOPY     uterine polyps  . RE-EXCISION OF BREAST LUMPECTOMY Right 12/22/2019   Procedure: RIGHT BREAST RE-EXCISION OF BREAST LUMPECTOMY;  Surgeon: Barry Dienes,  Dorris Fetch, MD;  Location: [facility];  Service: General;  Laterality: Right;  . WISDOM TOOTH EXTRACTION       FAMILY HISTORY:  Family History  Problem Relation Age of Onset  . Cancer Mother 55       ovarian cancer   . Prostate cancer Father      SOCIAL HISTORY:  reports that she has never smoked. She has never used  smokeless tobacco. She reports current alcohol use. She reports that she does not use drugs.  The patient is a retired Electrical engineer.  She lives in [place] with her husband.    ALLERGIES: Patient has no known allergies.   MEDICATIONS:  Current Outpatient Medications  Medication Sig Dispense Refill  . amLODipine (NORVASC) 5 MG tablet Take 5 mg by mouth daily.    Marland Kitchen aspirin EC 81 MG tablet Take 81 mg by mouth 3 (three) times a week. Swallow whole.     Marland Kitchen atorvastatin (LIPITOR) 10 MG tablet Take 10 mg by mouth every other day.     . hydrochlorothiazide (MICROZIDE) 12.5 MG capsule Take 12.5 mg by mouth daily.    Marland Kitchen oxyCODONE (OXY IR/ROXICODONE) 5 MG immediate release tablet Take 1 tablet (5 mg total) by mouth every 6 (six) hours as needed for severe pain. (Patient not taking: Reported on 01/13/2020) 15 tablet 0   No current facility-administered medications for this encounter.     REVIEW OF SYSTEMS: Overall the patient reports she is doing well, she denies any specific breast complaints.  She feels as though she is well-healed and saw Dr. Barry Dienes for postop check already.    PHYSICAL EXAM:  Unable to assess given encounter type.   ECOG = 0  0 - Asymptomatic (Fully active, able to carry on all predisease activities without restriction)  1 - Symptomatic but completely ambulatory (Restricted in physically strenuous activity but ambulatory and able to carry out work of a light or sedentary nature. For example, light housework, office work)  2 - Symptomatic, <50% in bed during the day (Ambulatory and capable of all self care but unable to carry out any work activities. Up and about more than 50% of waking hours)  3 - Symptomatic, >50% in bed, but not bedbound (Capable of only limited self-care, confined to bed or chair 50% or more of waking hours)  4 - Bedbound (Completely disabled. Cannot carry on any self-care. Totally confined to bed or chair)  5 - Death   Eustace Pen MM, Creech RH,  Tormey DC, et al. [phone]). "Toxicity and response criteria of the [facility]". [place] Oncol. 5 (6): 649-55    LABORATORY DATA:  Lab Results  Component Value Date   WBC 3.1 (L) 12/22/2019   HGB 13.0 12/22/2019   HCT 39.5 12/22/2019   MCV 88.4 12/22/2019   PLT 148 (L) 12/22/2019   Lab Results  Component Value Date   NA 141 12/22/2019   K 3.0 (L) 12/22/2019   CL 104 12/22/2019   CO2 27 12/22/2019   Lab Results  Component Value Date   ALT 36 12/22/2019   AST 26 12/22/2019   ALKPHOS 64 12/22/2019   BILITOT 1.8 (H) 12/22/2019      RADIOGRAPHY: No results found.     IMPRESSION/PLAN: 1. 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 Dr. Lisbeth Renshaw discusses the pathology findings and reviews the nature of noninvasive right breast disease. The patient has been cleared by surgery to proceed with adjuvant therapy.  We  reviewed the rationale for external radiotherapy to the breast followed by antiestrogen therapy. We discussed the risks, benefits, short, and long term effects of radiotherapy, and the patient is interested in proceeding. Dr. Lisbeth Renshaw discusses the delivery and logistics of radiotherapy and recommends a course of 4 weeks of radiotherapy.  She will come tomorrow for simulation at which time she will signed written consent to proceed.  She will also follow-up with Dr. Burr Medico to review her final decision making regarding adjuvant antiestrogen therapy.  We will follow this expectantly. 2. 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  Patient will follow-up as well with Dr. Burr Medico in surveillance that she remains without disease at this time.    Given current concerns for patient exposure during the COVID-19 pandemic, this encounter was conducted via telephone.  The patient has provided two factor identification and has given verbal consent for this type of encounter and has been advised to only accept a meeting of this type  in a secure network environment. The time spent during this encounter was 45 minutes including preparation, discussion, and coordination of the patient's care. The attendants for this meeting include Blenda Nicely, RN, Dr. Lisbeth Renshaw, Hayden Pedro  and Clydell Hakim.  During the encounter,  Blenda Nicely, RN, Dr. Lisbeth Renshaw, and Hayden Pedro were located at [facility] Radiation Oncology Department.  Jamal Haskin was located at home.   The above documentation reflects my direct findings during this shared patient visit. Please see the separate note by Dr. Lisbeth Renshaw on this date for the remainder of the patient's plan of care.    Carola Rhine, PAC

## 2020-01-14 ENCOUNTER — Other Ambulatory Visit: Payer: Self-pay

## 2020-01-14 ENCOUNTER — Ambulatory Visit
Admission: RE | Admit: 2020-01-14 | Discharge: 2020-01-14 | Disposition: A | Discharge status: Home / Self Care | Payer: Medicare Other | Source: Ambulatory Visit | Attending: Radiation Oncology | Admitting: Radiation Oncology

## 2020-01-14 DIAGNOSIS — D0511 Intraductal carcinoma in situ of right breast: Secondary | ICD-10-CM | POA: Diagnosis present

## 2020-01-14 DIAGNOSIS — Z510 Encounter for antineoplastic radiation therapy: Secondary | ICD-10-CM | POA: Diagnosis present

## 2020-01-20 ENCOUNTER — Encounter: Payer: Self-pay | Admitting: *Deleted

## 2020-01-21 DIAGNOSIS — Z510 Encounter for antineoplastic radiation therapy: Secondary | ICD-10-CM | POA: Diagnosis not present

## 2020-01-24 ENCOUNTER — Other Ambulatory Visit: Payer: Self-pay

## 2020-01-24 ENCOUNTER — Ambulatory Visit
Admission: RE | Admit: 2020-01-24 | Discharge: 2020-01-24 | Disposition: A | Discharge status: Home / Self Care | Payer: Medicare Other | Source: Ambulatory Visit | Attending: Radiation Oncology | Admitting: Radiation Oncology

## 2020-01-24 DIAGNOSIS — Z510 Encounter for antineoplastic radiation therapy: Secondary | ICD-10-CM | POA: Diagnosis not present

## 2020-01-25 ENCOUNTER — Ambulatory Visit
Admission: RE | Admit: 2020-01-25 | Discharge: 2020-01-25 | Disposition: A | Discharge status: Home / Self Care | Payer: Medicare Other | Source: Ambulatory Visit | Attending: Radiation Oncology | Admitting: Radiation Oncology

## 2020-01-25 DIAGNOSIS — Z510 Encounter for antineoplastic radiation therapy: Secondary | ICD-10-CM | POA: Diagnosis not present

## 2020-01-26 ENCOUNTER — Ambulatory Visit
Admission: RE | Admit: 2020-01-26 | Discharge: 2020-01-26 | Disposition: A | Discharge status: Home / Self Care | Payer: Medicare Other | Source: Ambulatory Visit | Attending: Radiation Oncology | Admitting: Radiation Oncology

## 2020-01-26 ENCOUNTER — Other Ambulatory Visit: Payer: Self-pay

## 2020-01-26 DIAGNOSIS — Z510 Encounter for antineoplastic radiation therapy: Secondary | ICD-10-CM | POA: Diagnosis not present

## 2020-01-27 ENCOUNTER — Ambulatory Visit
Admission: RE | Admit: 2020-01-27 | Discharge: 2020-01-27 | Disposition: A | Discharge status: Home / Self Care | Payer: Medicare Other | Source: Ambulatory Visit | Attending: Radiation Oncology | Admitting: Radiation Oncology

## 2020-01-27 DIAGNOSIS — Z510 Encounter for antineoplastic radiation therapy: Secondary | ICD-10-CM | POA: Diagnosis not present

## 2020-01-27 NOTE — Progress Notes (Signed)
Pt here for patient teaching.  Pt given Radiation and You booklet, skin care instructions, Alra deodorant and Radiaplex gel.  Reviewed areas of pertinence such as fatigue, hair loss, skin changes, breast tenderness and breast swelling . Pt able to give teach back of to pat skin and use unscented/gentle soap,apply Radiaplex bid, avoid applying anything to skin within 4 hours of treatment, avoid wearing an under wire bra and to use an electric razor if they must shave. Pt verbalizes understanding of information given and will contact nursing with any questions or concerns.     Nikia Levels M. Jehieli Brassell RN, BSN      

## 2020-01-28 ENCOUNTER — Ambulatory Visit
Admission: RE | Admit: 2020-01-28 | Discharge: 2020-01-28 | Disposition: A | Discharge status: Home / Self Care | Payer: Medicare Other | Source: Ambulatory Visit | Attending: Radiation Oncology | Admitting: Radiation Oncology

## 2020-01-28 DIAGNOSIS — Z170 Estrogen receptor positive status [ER+]: Secondary | ICD-10-CM | POA: Diagnosis not present

## 2020-01-28 DIAGNOSIS — Z853 Personal history of malignant neoplasm of breast: Secondary | ICD-10-CM | POA: Diagnosis not present

## 2020-01-28 DIAGNOSIS — D0511 Intraductal carcinoma in situ of right breast: Secondary | ICD-10-CM | POA: Diagnosis present

## 2020-01-28 DIAGNOSIS — Z510 Encounter for antineoplastic radiation therapy: Secondary | ICD-10-CM | POA: Diagnosis not present

## 2020-01-28 DIAGNOSIS — D0512 Intraductal carcinoma in situ of left breast: Secondary | ICD-10-CM

## 2020-01-28 MED ORDER — ALRA NON-METALLIC DEODORANT (RAD-ONC)
1.0000 "application " | Freq: Once | TOPICAL | Status: AC
  Administered 2020-01-28: 1 via TOPICAL

## 2020-01-28 MED ORDER — RADIAPLEXRX EX GEL: Freq: Once | CUTANEOUS | Status: AC

## 2020-01-31 ENCOUNTER — Ambulatory Visit
Admission: RE | Admit: 2020-01-31 | Discharge: 2020-01-31 | Disposition: A | Discharge status: Home / Self Care | Payer: Medicare Other | Source: Ambulatory Visit | Attending: Radiation Oncology | Admitting: Radiation Oncology

## 2020-01-31 DIAGNOSIS — Z510 Encounter for antineoplastic radiation therapy: Secondary | ICD-10-CM | POA: Diagnosis not present

## 2020-02-01 ENCOUNTER — Ambulatory Visit
Admission: RE | Admit: 2020-02-01 | Discharge: 2020-02-01 | Disposition: A | Discharge status: Home / Self Care | Payer: Medicare Other | Source: Ambulatory Visit | Attending: Radiation Oncology | Admitting: Radiation Oncology

## 2020-02-01 ENCOUNTER — Other Ambulatory Visit: Payer: Self-pay

## 2020-02-01 DIAGNOSIS — Z510 Encounter for antineoplastic radiation therapy: Secondary | ICD-10-CM | POA: Diagnosis not present

## 2020-02-02 ENCOUNTER — Ambulatory Visit
Admission: RE | Admit: 2020-02-02 | Discharge: 2020-02-02 | Disposition: A | Discharge status: Home / Self Care | Payer: Medicare Other | Source: Ambulatory Visit | Attending: Radiation Oncology | Admitting: Radiation Oncology

## 2020-02-02 DIAGNOSIS — Z510 Encounter for antineoplastic radiation therapy: Secondary | ICD-10-CM | POA: Diagnosis not present

## 2020-02-03 ENCOUNTER — Ambulatory Visit
Admission: RE | Admit: 2020-02-03 | Discharge: 2020-02-03 | Disposition: A | Discharge status: Home / Self Care | Payer: Medicare Other | Source: Ambulatory Visit | Attending: Radiation Oncology | Admitting: Radiation Oncology

## 2020-02-03 DIAGNOSIS — Z510 Encounter for antineoplastic radiation therapy: Secondary | ICD-10-CM | POA: Diagnosis not present

## 2020-02-04 ENCOUNTER — Ambulatory Visit
Admission: RE | Admit: 2020-02-04 | Discharge: 2020-02-04 | Disposition: A | Discharge status: Home / Self Care | Payer: Medicare Other | Source: Ambulatory Visit | Attending: Radiation Oncology | Admitting: Radiation Oncology

## 2020-02-04 DIAGNOSIS — Z510 Encounter for antineoplastic radiation therapy: Secondary | ICD-10-CM | POA: Diagnosis not present

## 2020-02-07 ENCOUNTER — Ambulatory Visit
Admission: RE | Admit: 2020-02-07 | Discharge: 2020-02-07 | Disposition: A | Discharge status: Home / Self Care | Payer: Medicare Other | Source: Ambulatory Visit | Attending: Radiation Oncology | Admitting: Radiation Oncology

## 2020-02-07 ENCOUNTER — Encounter: Payer: Self-pay | Admitting: *Deleted

## 2020-02-07 DIAGNOSIS — Z510 Encounter for antineoplastic radiation therapy: Secondary | ICD-10-CM | POA: Diagnosis not present

## 2020-02-08 ENCOUNTER — Other Ambulatory Visit: Payer: Self-pay

## 2020-02-08 ENCOUNTER — Ambulatory Visit
Admission: RE | Admit: 2020-02-08 | Discharge: 2020-02-08 | Disposition: A | Discharge status: Home / Self Care | Payer: Medicare Other | Source: Ambulatory Visit | Attending: Radiation Oncology | Admitting: Radiation Oncology

## 2020-02-08 DIAGNOSIS — Z510 Encounter for antineoplastic radiation therapy: Secondary | ICD-10-CM | POA: Diagnosis not present

## 2020-02-09 ENCOUNTER — Ambulatory Visit
Admission: RE | Admit: 2020-02-09 | Discharge: 2020-02-09 | Disposition: A | Discharge status: Home / Self Care | Payer: Medicare Other | Source: Ambulatory Visit | Attending: Radiation Oncology | Admitting: Radiation Oncology

## 2020-02-09 DIAGNOSIS — Z510 Encounter for antineoplastic radiation therapy: Secondary | ICD-10-CM | POA: Diagnosis not present

## 2020-02-09 NOTE — Progress Notes (Signed)
[facility]   [phone] [phone]   Clinic Follow up Note   Patient Care Team: Donald Prose, MD as PCP - General (Family Medicine) Stark Klein, MD as Consulting Physician (General Surgery) Truitt Merle, MD as Consulting Physician (Hematology) Kyung Rudd, MD as Consulting Physician (Radiation Oncology) Gardenia Phlegm, NP as Nurse Practitioner (Hematology and Oncology)  Date of Service:  02/11/2020  CHIEF COMPLAINT: F/u of right breast DCIS and h/o left breast DCIS  SUMMARY OF ONCOLOGIC HISTORY: Oncology History Overview Note  Cancer Staging Ductal carcinoma in situ (DCIS) of left breast Staging form: Breast, AJCC 8th Edition - Clinical stage from 08/25/2017: Stage 0 (cTis (DCIS), cN0, cM0, ER: Unknown, PR: Unknown, HER2: Not Assessed) - Signed by Truitt Merle, MD on 09/03/2017 - Pathologic: Stage 0 (pTis (DCIS), pN0, cM0, ER+, PR+) - Unsigned     Ductal carcinoma in situ (DCIS) of left breast  08/21/2017 Mammogram   IMPRESSION: New grouped pleomorphic calcifications within the upper-outer quadrant of the LEFT breast, spanning 4 mm extent. This is a suspicious finding for which stereotactic biopsy is recommended.   08/25/2017 Initial Biopsy   Diagnosis 08/25/17 Breast, left, needle core biopsy, upper outer quadrant coil clip - DUCTAL CARCINOMA IN SITU WITH CALCIFICATIONS, INTERMEDIATE GRADE. - DUCTAL PAPILLOMA. - SEE MICROSCOPIC DESCRIPTION.   08/25/2017 Cancer Staging   Staging form: Breast, AJCC 8th Edition - Clinical stage from 08/25/2017: Stage 0 (cTis (DCIS), cN0, cM0, ER: Unknown, PR: Unknown, HER2: Not Assessed) - Signed by Truitt Merle, MD on 09/03/2017   08/29/2017 Initial Diagnosis   Ductal carcinoma in situ (DCIS) of left breast   09/10/2017 Surgery   left BREAST LUMPECTOMY WITH RADIOACTIVE SEED LOCALIZATION by Stark Klein, MD   09/10/2017 Pathology Results   09/10/2017 Surgical Pathology Diagnosis Breast, lumpectomy -  DUCTAL CARCINOMA IN SITU, INTERMEDIATE NUCLEAR GRADE, WITH CALCIFICATIONS - MARGINS UNINVOLVED BY CARCINOMA - DUCT ECTASIA AND PERIDUCTULAR CHRONIC INFLAMMATION - FIBROADENOMATOID NODULE - PREVIOUS BIOPSY SITE CHANGES - SEE ONCOLOGY TABLE BELOW     10/08/2017 - 11/06/2017 Radiation Therapy   The patient initially received a dose of 42.56 Gy in 16 fractions to the breast using whole-breast tangent fields. This was delivered using a 3-D conformal technique. The patient then received a boost to the seroma. This delivered an additional 8 Gy in 4 fractions using a 3 field photon technique due to the depth of the seroma. The total dose was 50.56 Gy.    Anti-estrogen oral therapy   She declined anti-estrogen therapy due to concerns with side effects.    Ductal carcinoma in situ (DCIS) of right breast  09/29/2019 Mammogram   Diagnostic Mammogram on 09/29/19  IMPRESSION: Suspicious calcifications spanning 27m in the 12 o'clock region of the right breast.   10/06/2019 Initial Biopsy   Diagnosis Breast, right, needle core biopsy, upper central, coil clip - DUCTAL CARCINOMA IN SITU WITH CALCIFICATIONS, INTERMEDIATE NUCLEAR GRADE. - SEE MICROSCOPIC DESCRIPTION. Microscopic Comment Estrogen and progesterone receptors will be performed. PROGNOSTIC INDICATOR RESULTS: Immunohistochemical and morphometric analysis performed manually Estrogen Receptor: 100%, STRONG STAINING INTESITY Progesterone Receptor: 80%, STRONG STAINING INTESITY   10/13/2019 Initial Diagnosis   Ductal carcinoma in situ (DCIS) of right breast   11/18/2019 Surgery   RIGHT BREAST LUMPECTOMY WITH RADIOACTIVE SEED LOCALIZATION with Dr BBarry Dienes   11/18/2019 Pathology Results   FINAL MICROSCOPIC DIAGNOSIS:   A. BREAST, RIGHT, LUMPECTOMY:  - Ductal carcinoma in situ with calcifications, intermediate grade with  focal necrosis, 1.3 cm  -  No evidence of invasive carcinoma  - DCIS focally involves the lateral margin and is 0.3 cm from  posterior  margin  - Atypical ductal hyperplasia, see comment  - Biopsy site changes  - See oncology table    COMMENT:   The small focus of ADH has a dissimilar appearance from rest of the DCIS  and is about 1 mm from the posterior margin.    12/22/2019 Surgery   RIGHT BREAST RE-EXCISION OF BREAST LUMPECTOMY with Dr Barry Dienes   12/22/2019 Pathology Results   FINAL MICROSCOPIC DIAGNOSIS:   A. BREAST, RIGHT, LATERAL MARGIN, RE-EXCISION:  - Resection site changes.  - Fibrocystic change.  - No malignancy identified.    01/24/2020 - 02/18/2020 Radiation Therapy   Adjuvant Radiation with Dr Lisbeth Renshaw    02/2020 -  Anti-estrogen oral therapy   Tamoxifen 83m starting 02/2020, will start at 129mand if tolerable will increase 2019m      CURRENT THERAPY:  Adjuvant Radiation 01/24/20-02/18/20 Tamoxifen 74m17marting 02/2020, will start at 10mg80m if tolerable will increase 74mg.73mINTERVAL HISTORY:  Victoria Fuller for a follow up of DCIS. She presents to the clinic alone. She notes she is tolerating RT well. She has mild fatigue but still able to function adequately. She has mild skin hyperpigmentation. She notes her menopause was not noticeable.    REVIEW OF SYSTEMS:   Constitutional: Denies fevers, chills or abnormal weight loss (+) Mild fatigue  Eyes: Denies blurriness of vision Ears, nose, mouth, throat, and face: Denies mucositis or sore throat Respiratory: Denies cough, dyspnea or wheezes Cardiovascular: Denies palpitation, chest discomfort or lower extremity swelling Gastrointestinal:  Denies nausea, heartburn or change in bowel habits Skin: Denies abnormal skin rashes Lymphatics: Denies new lymphadenopathy or easy bruising Neurological:Denies numbness, tingling or new weaknesses Behavioral/Psych: Mood is stable, no new changes  All other systems were reviewed with the patient and are negative.  MEDICAL HISTORY:  Past Medical History:  Diagnosis Date  . Cancer  ([facility]) [id number]   Left breast cancer  . Hyperlipidemia   . Hypertension   . Wears contact lenses     SURGICAL HISTORY: Past Surgical History:  Procedure Laterality Date  . ABDOMINAL HYSTERECTOMY    . BREAST LUMPECTOMY Left 2019  . BREAST LUMPECTOMY WITH RADIOACTIVE SEED LOCALIZATION Left 09/10/2017   Procedure: BREAST LUMPECTOMY WITH RADIOACTIVE SEED LOCALIZATION;  Surgeon: ByerlyStark Klein [facility] [place]: [facility];  Laterality: Left;  . BREAST LUMPECTOMY WITH RADIOACTIVE SEED LOCALIZATION Right 11/18/2019   Procedure: RIGHT BREAST LUMPECTOMY WITH RADIOACTIVE SEED LOCALIZATION;  Surgeon: ByerlyStark Klein [facility];[place]: General;  Laterality: Right;  . COLONOSCOPY W/ BIOPSIES AND POLYPECTOMY    . DIAGNOSTIC LAPAROSCOPY     uterine polyps  . RE-EXCISION OF BREAST LUMPECTOMY Right 12/22/2019   Procedure: RIGHT BREAST RE-EXCISION OF BREAST LUMPECTOMY;  Surgeon: ByerlyStark Klein [facility];[place]: General;  Laterality: Right;  . WISDOM TOOTH EXTRACTION      I have reviewed the social history and family history with the patient and they are unchanged from previous note.  ALLERGIES:  has No Known Allergies.  MEDICATIONS:  Current Outpatient Medications  Medication Sig Dispense Refill  . amLODipine (NORVASC) 5 MG tablet Take 5 mg by mouth daily.    . aspiMarland Kitchenin EC 81 MG tablet Take 81 mg by mouth 3 (three) times a week. Swallow whole.     . atorMarland Kitchenastatin (  LIPITOR) 10 MG tablet Take 10 mg by mouth every other day.     . hydrochlorothiazide (MICROZIDE) 12.5 MG capsule Take 12.5 mg by mouth daily.    Marland Kitchen oxyCODONE (OXY IR/ROXICODONE) 5 MG immediate release tablet Take 1 tablet (5 mg total) by mouth every 6 (six) hours as needed for severe pain. (Patient not taking: Reported on 01/13/2020) 15 tablet 0  . tamoxifen (NOLVADEX) 10 MG tablet Take 1 tablet (10 mg total) by mouth daily. If tolerates well, increase to 33m daily 60 tablet 0   No  current facility-administered medications for this visit.    PHYSICAL EXAMINATION: ECOG PERFORMANCE STATUS: 1 - Symptomatic but completely ambulatory  Vitals:   02/11/20 1235  BP: 128/75  Pulse: 83  Resp: 17  Temp: (!) 97.5 F (36.4 C)  SpO2: 100%   Filed Weights   02/11/20 1235  Weight: 183 lb 9.6 oz (83.3 kg)    GENERAL:alert, no distress and comfortable SKIN: skin color, texture, turgor are normal, no rashes or significant lesions EYES: normal, Conjunctiva are pink and non-injected, sclera clear  NECK: supple, thyroid normal size, non-tender, without nodularity LYMPH:  no palpable lymphadenopathy in the cervical, axillary  LUNGS: clear to auscultation and percussion with normal breathing effort HEART: regular rate & rhythm and no murmurs and no lower extremity edema ABDOMEN:abdomen soft, non-tender and normal bowel sounds Musculoskeletal:no cyanosis of digits and no clubbing  NEURO: alert & oriented x 3 with fluent speech, no focal motor/sensory deficits BREAST: S/p right and left lumpectomy: surgical incisions healed well (+) Mild skin hyperpigmentation from RT.   LABORATORY DATA:  I have reviewed the data as listed CBC Latest Ref Rng & Units 12/22/2019 11/09/2019 09/03/2017  WBC 4.0 - 10.5 K/uL 3.1(L) 2.9(L) 3.4(L)  Hemoglobin 12.0 - 15.0 g/dL 13.0 12.8 13.5  Hematocrit 36 - 46 % 39.5 39.0 40.1  Platelets 150 - 400 K/uL 148(L) 143(L) 177     CMP Latest Ref Rng & Units 12/22/2019 11/09/2019 09/03/2017  Glucose 70 - 99 mg/dL 94 97 101  BUN 8 - 23 mg/dL '9 8 7  ' Creatinine 0.44 - 1.00 mg/dL 0.71 0.61 0.74  Sodium 135 - 145 mmol/L 141 141 142  Potassium 3.5 - 5.1 mmol/L 3.0(L) 3.1(L) 3.2(L)  Chloride 98 - 111 mmol/L 104 103 102  CO2 22 - 32 mmol/L 27 28 32(H)  Calcium 8.9 - 10.3 mg/dL 9.6 9.5 10.5(H)  Total Protein 6.5 - 8.1 g/dL 6.8 - 7.5  Total Bilirubin 0.3 - 1.2 mg/dL 1.8(H) - 1.9(H)  Alkaline Phos 38 - 126 U/L 64 - 73  AST 15 - 41 U/L 26 - 22  ALT 0 - 44 U/L 36 - 40       RADIOGRAPHIC STUDIES: I have personally reviewed the radiological images as listed and agreed with the findings in the report. No results found.   ASSESSMENT & PLAN:  Victoria Fuller a 69y.o. female with    1. Ductal Carcinoma in situ (DCIS) of Right breast, ER/PR+, Intermediate grade -She was diagnosed in 09/2019. She underwent right breast lumpectomy with Dr BBarry Dieneson 11/18/19. Path showed intermediate grade DCIS and ADH without invasive breast cancer. Given her lateral positive margin she underwent right breast re-excision surgery on 12/22/19 with Dr BBarry Dienesand no further malignancy was found.  -To reduce her risk of future right breast cancer she started adjuvant radiation with Dr MLisbeth Renshawon 01/24/20. Plan to complete on 02/18/20. She is tolerating well with mild fatigue and mild  skin change. -Given her strongly positive ER and PR, and second episode of DCIS, I recommend antiestrogen therapy with Tamoxifen or Anastrozole for 5 years. I again reviewed possible side effect of each with patient again and encouraged weight and health management on treatment.  -She is interested in proceeding with Tamoxifen. Plan to start in 02/2020. She will try 6m first and if tolerable after a few weeks will increase to 235m I dicussed there is limited data on benefit of 1069mamoxifen alone, but if 60m108m not tolerable she can remain on 10mg63mGiven her second DCIS in within 2 years, she is eligible fore genetic testing. She declined.  -phone call in 2 months and survivorship clinic in 4 months with NP Lacie    2. 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 She is s/p leftlumpectomyand adjuvant radiation. -At the time shedeclinedchemoprevention with antiestrogen therapy.   3. Hypertension and dyslipidemia, weight gain -Continue medication and follow-up with primary care physician   PLAN: -I  called in 10mg 1mxifen to start after RT, and encouraged her to increase to 60mg d80m if she tolerates well  -Phone call in 2 months  -Lab and survivorship clinic with NP Laice in 4 months    No problem-specific Assessment & Plan notes found for this encounter.   No orders of the defined types were placed in this encounter.  All questions were answered. The patient knows to call the clinic with any problems, questions or concerns. No barriers to learning was detected. The total time spent in the appointment was 30 minutes.     Victoria Fuller FenTruitt Merle/15/2021   I, Amoya BJoslyn Devonting as scribe for Kiel Cockerell FenTruitt Merle I have reviewed the above documentation for accuracy and completeness, and I agree with the above.

## 2020-02-10 ENCOUNTER — Ambulatory Visit
Admission: RE | Admit: 2020-02-10 | Discharge: 2020-02-10 | Disposition: A | Discharge status: Home / Self Care | Payer: Medicare Other | Source: Ambulatory Visit | Attending: Radiation Oncology | Admitting: Radiation Oncology

## 2020-02-10 DIAGNOSIS — Z510 Encounter for antineoplastic radiation therapy: Secondary | ICD-10-CM | POA: Diagnosis not present

## 2020-02-11 ENCOUNTER — Ambulatory Visit
Admission: RE | Admit: 2020-02-11 | Discharge: 2020-02-11 | Disposition: A | Discharge status: Home / Self Care | Payer: Medicare Other | Source: Ambulatory Visit | Attending: Radiation Oncology | Admitting: Radiation Oncology

## 2020-02-11 ENCOUNTER — Other Ambulatory Visit: Payer: Self-pay

## 2020-02-11 ENCOUNTER — Ambulatory Visit: Payer: Medicare Other | Admitting: Radiation Oncology

## 2020-02-11 ENCOUNTER — Encounter: Payer: Self-pay | Admitting: Hematology

## 2020-02-11 ENCOUNTER — Inpatient Hospital Stay: Payer: Medicare Other | Attending: Hematology | Admitting: Hematology

## 2020-02-11 VITALS — BP 128/75 | HR 83 | Temp 97.5°F | Resp 17 | Ht 69.0 in | Wt 183.6 lb

## 2020-02-11 DIAGNOSIS — R5383 Other fatigue: Secondary | ICD-10-CM | POA: Insufficient documentation

## 2020-02-11 DIAGNOSIS — I10 Essential (primary) hypertension: Secondary | ICD-10-CM | POA: Insufficient documentation

## 2020-02-11 DIAGNOSIS — Z510 Encounter for antineoplastic radiation therapy: Secondary | ICD-10-CM | POA: Diagnosis not present

## 2020-02-11 DIAGNOSIS — Z79899 Other long term (current) drug therapy: Secondary | ICD-10-CM | POA: Diagnosis not present

## 2020-02-11 DIAGNOSIS — D0511 Intraductal carcinoma in situ of right breast: Secondary | ICD-10-CM | POA: Diagnosis not present

## 2020-02-11 DIAGNOSIS — D0512 Intraductal carcinoma in situ of left breast: Secondary | ICD-10-CM | POA: Diagnosis present

## 2020-02-11 DIAGNOSIS — E785 Hyperlipidemia, unspecified: Secondary | ICD-10-CM | POA: Insufficient documentation

## 2020-02-11 MED ORDER — TAMOXIFEN CITRATE 10 MG PO TABS: 10.0000 mg | ORAL_TABLET | Freq: Every day | ORAL | 0 refills | Status: DC

## 2020-02-14 ENCOUNTER — Ambulatory Visit
Admission: RE | Admit: 2020-02-14 | Discharge: 2020-02-14 | Disposition: A | Discharge status: Home / Self Care | Payer: Medicare Other | Source: Ambulatory Visit | Attending: Radiation Oncology | Admitting: Radiation Oncology

## 2020-02-14 ENCOUNTER — Telehealth: Payer: Self-pay | Admitting: Hematology

## 2020-02-14 DIAGNOSIS — Z510 Encounter for antineoplastic radiation therapy: Secondary | ICD-10-CM | POA: Diagnosis not present

## 2020-02-14 NOTE — Telephone Encounter (Signed)
Scheduled per 10/15 los. Pt is aware of appt times and dates.

## 2020-02-15 ENCOUNTER — Ambulatory Visit
Admission: RE | Admit: 2020-02-15 | Discharge: 2020-02-15 | Disposition: A | Discharge status: Home / Self Care | Payer: Medicare Other | Source: Ambulatory Visit | Attending: Radiation Oncology | Admitting: Radiation Oncology

## 2020-02-15 DIAGNOSIS — Z510 Encounter for antineoplastic radiation therapy: Secondary | ICD-10-CM | POA: Diagnosis not present

## 2020-02-16 ENCOUNTER — Ambulatory Visit
Admission: RE | Admit: 2020-02-16 | Discharge: 2020-02-16 | Disposition: A | Discharge status: Home / Self Care | Payer: Medicare Other | Source: Ambulatory Visit | Attending: Radiation Oncology | Admitting: Radiation Oncology

## 2020-02-16 DIAGNOSIS — Z510 Encounter for antineoplastic radiation therapy: Secondary | ICD-10-CM | POA: Diagnosis not present

## 2020-02-17 ENCOUNTER — Encounter: Payer: Self-pay | Admitting: *Deleted

## 2020-02-17 ENCOUNTER — Ambulatory Visit
Admission: RE | Admit: 2020-02-17 | Discharge: 2020-02-17 | Disposition: A | Discharge status: Home / Self Care | Payer: Medicare Other | Source: Ambulatory Visit | Attending: Radiation Oncology | Admitting: Radiation Oncology

## 2020-02-17 DIAGNOSIS — Z510 Encounter for antineoplastic radiation therapy: Secondary | ICD-10-CM | POA: Diagnosis not present

## 2020-02-18 ENCOUNTER — Ambulatory Visit
Admission: RE | Admit: 2020-02-18 | Discharge: 2020-02-18 | Disposition: A | Discharge status: Home / Self Care | Payer: Medicare Other | Source: Ambulatory Visit | Attending: Radiation Oncology | Admitting: Radiation Oncology

## 2020-02-18 ENCOUNTER — Encounter: Payer: Self-pay | Admitting: Radiation Oncology

## 2020-02-18 DIAGNOSIS — Z510 Encounter for antineoplastic radiation therapy: Secondary | ICD-10-CM | POA: Diagnosis not present

## 2020-02-24 ENCOUNTER — Other Ambulatory Visit: Payer: Self-pay

## 2020-02-24 DIAGNOSIS — D0512 Intraductal carcinoma in situ of left breast: Secondary | ICD-10-CM

## 2020-02-24 MED ORDER — TAMOXIFEN CITRATE 10 MG PO TABS: 10.0000 mg | ORAL_TABLET | Freq: Every day | ORAL | 0 refills | Status: DC

## 2020-02-24 NOTE — Progress Notes (Signed)
Resent prescription.  First prescription did not go through.

## 2020-03-25 ENCOUNTER — Telehealth: Payer: Self-pay | Admitting: Radiation Oncology

## 2020-03-25 NOTE — Telephone Encounter (Signed)
  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  10/08/17 - 11/06/17: The patient initially received a dose of 42.56Gy in 82fractions to the left breast using whole-breast tangent fields. This was delivered using a 3-D conformal technique. The patient then received a boost to the seroma. This delivered an additional 8Gy in 48fractions using a 3 field photon technique due to the depth of the seroma. The total dose was 50.56Gy.   Narrative:  The patient was contacted today for routine follow-up. During treatment she did very well with radiotherapy and did not have significant desquamation.    Impression/Plan: 1. 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 I was unable to reach the patient today but I was able to leave a voicemail and on it, I discussed that we would be happy to continue to follow her as needed, but she will also continue to follow up with Dr. Burr Medico in medical oncology. She was counseled on skin care as well as measures to avoid sun exposure to this area.  2. 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 She will follow-up as well with Dr. Burr Medico in surveillance as she remains without disease at this time.    Carola Rhine, PAC

## 2020-03-27 NOTE — Progress Notes (Signed)
  Radiation Oncology         [phone] ________________________________  Name: Cherolyn Behrle MRN: [id number]  Date: 02/18/2020  DOB: [birth year]  End of Treatment Note  Diagnosis:   right-sided breast cancer     Indication for treatment:  Curative       Radiation treatment dates:   01/24/20 - 02/18/20  Site/dose:   The patient initially received a dose of 42.56 Gy in 16 fractions to the breast using whole-breast tangent fields. This was delivered using a 3-D conformal technique. The patient then received a boost to the seroma. This delivered an additional 8 Gy in 63fractions using a 3 field photon technique due to the depth of the seroma. The total dose was 50.56 Gy.  Narrative: The patient tolerated radiation treatment relatively well.   The patient had some expected skin irritation as she progressed during treatment.   Plan: The patient has completed radiation treatment. The patient will return to radiation oncology clinic for routine followup in one month. I advised the patient to call or return sooner if they have any questions or concerns related to their recovery or treatment. ________________________________  Jodelle Gross, M.D., Ph.D.

## 2020-04-10 NOTE — Progress Notes (Signed)
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 This is a suspicious finding for which stereotactic biopsy is recommended.   08/25/2017 Initial Biopsy   Diagnosis 08/25/17 Breast, left, needle core biopsy, upper outer quadrant coil clip - DUCTAL CARCINOMA IN SITU WITH CALCIFICATIONS, INTERMEDIATE GRADE. - DUCTAL PAPILLOMA. - SEE MICROSCOPIC DESCRIPTION.   08/25/2017 Cancer Staging   Staging form: Breast, AJCC 8th Edition - Clinical stage from 08/25/2017: Stage 0 (cTis (DCIS), cN0, cM0, ER: Unknown, PR: Unknown, HER2: Not Assessed) - Signed by Truitt Merle, MD on 09/03/2017   08/29/2017 Initial Diagnosis   Ductal carcinoma in situ (DCIS) of left breast   09/10/2017 Surgery   left BREAST LUMPECTOMY WITH RADIOACTIVE SEED LOCALIZATION by Stark Klein, MD   09/10/2017 Pathology Results   09/10/2017 Surgical Pathology Diagnosis Breast, lumpectomy -  DUCTAL CARCINOMA IN SITU, INTERMEDIATE NUCLEAR GRADE, WITH CALCIFICATIONS - MARGINS UNINVOLVED BY CARCINOMA - DUCT ECTASIA AND PERIDUCTULAR CHRONIC INFLAMMATION - FIBROADENOMATOID NODULE - PREVIOUS BIOPSY SITE CHANGES - SEE ONCOLOGY TABLE BELOW     10/08/2017 - 11/06/2017 Radiation Therapy   The patient initially received a dose of 42.56 Gy in 16 fractions to the breast using whole-breast tangent fields. This was delivered using a 3-D conformal technique. The patient then received a boost to the seroma. This delivered an additional 8 Gy in 4 fractions using a 3 field photon technique due to the depth of the seroma. The total dose was 50.56 Gy.    Anti-estrogen oral therapy   She declined anti-estrogen therapy due to concerns with side effects.    Ductal carcinoma in situ (DCIS) of right breast  09/29/2019 Mammogram   Diagnostic Mammogram on 09/29/19  IMPRESSION: Suspicious calcifications spanning 76m in the 12 o'clock region of the right breast.   10/06/2019 Initial Biopsy   Diagnosis Breast, right, needle core biopsy, upper central, coil clip - DUCTAL CARCINOMA IN SITU WITH CALCIFICATIONS, INTERMEDIATE NUCLEAR GRADE. - SEE MICROSCOPIC DESCRIPTION. Microscopic Comment Estrogen and progesterone receptors will be performed. PROGNOSTIC INDICATOR RESULTS: Immunohistochemical and morphometric analysis performed manually Estrogen Receptor: 100%, STRONG STAINING INTESITY Progesterone Receptor: 80%, STRONG STAINING INTESITY   10/13/2019 Initial Diagnosis   Ductal carcinoma in situ (DCIS) of right breast   11/18/2019 Surgery   RIGHT BREAST LUMPECTOMY WITH RADIOACTIVE SEED LOCALIZATION with Dr BBarry Dienes   11/18/2019 Pathology Results   FINAL MICROSCOPIC DIAGNOSIS:   A. BREAST, RIGHT, LUMPECTOMY:  - Ductal carcinoma in situ with calcifications, intermediate grade with  focal necrosis,  1.3 cm  - No evidence of invasive carcinoma  - DCIS focally involves the lateral margin and is 0.3 cm from  posterior  margin  - Atypical ductal hyperplasia, see comment  - Biopsy site changes  - See oncology table    COMMENT:   The small focus of ADH has a dissimilar appearance from rest of the DCIS  and is about 1 mm from the posterior margin.    12/22/2019 Surgery   RIGHT BREAST RE-EXCISION OF BREAST LUMPECTOMY with Dr Barry Dienes   12/22/2019 Pathology Results   FINAL MICROSCOPIC DIAGNOSIS:   A. BREAST, RIGHT, LATERAL MARGIN, RE-EXCISION:  - Resection site changes.  - Fibrocystic change.  - No malignancy identified.    01/24/2020 - 02/18/2020 Radiation Therapy   Adjuvant Radiation with Dr Lisbeth Renshaw    02/2020 -  Anti-estrogen oral therapy   Tamoxifen 51m starting 02/2020, will start at 183mand if tolerable will increase 2052m      CURRENT THERAPY:  Tamoxifen 65m73marting 02/2020, will start at 10mg65m if tolerable will increase 65mg.15mNTERVAL HISTORY:  Victoria Fuller for a follow up. She notes she started Tamoxifen on 02/28/20. She notes she has been more fatigue lately. She stopped Radiation on 02/18/20. She is not sure what her fatigue is related to. She notes she has tightness in her shoulders although she has normal ROM. She has not done PT and has not been consistent with stretching. She notes she gained 4 pounds in 2 months. She notes she decreased her walking since the weather is colder. She also notes having hot flashes but is currently tolerable.    REVIEW OF SYSTEMS:   Constitutional: Denies fevers, chills or abnormal weight loss (+) Fatigue (+) Hot flashes  Eyes: Denies blurriness of vision Ears, nose, mouth, throat, and face: Denies mucositis or sore throat Respiratory: Denies cough, dyspnea or wheezes Cardiovascular: Denies palpitation, chest discomfort or lower extremity swelling Gastrointestinal:  Denies nausea, heartburn or change in bowel habits Skin: Denies abnormal skin rashes MSK: (+) B/l shoulder tightness  Lymphatics: Denies new lymphadenopathy or  easy bruising Neurological:Denies numbness, tingling or new weaknesses Behavioral/Psych: Mood is stable, no new changes  All other systems were reviewed with the patient and are negative.  MEDICAL HISTORY:  Past Medical History:  Diagnosis Date  . Cancer (HCC) [id number]   Left breast cancer  . Hyperlipidemia   . Hypertension   . Wears contact lenses     SURGICAL HISTORY: Past Surgical History:  Procedure Laterality Date  . ABDOMINAL HYSTERECTOMY    . BREAST LUMPECTOMY Left 2019  . BREAST LUMPECTOMY WITH RADIOACTIVE SEED LOCALIZATION Left 09/10/2017   Procedure: BREAST LUMPECTOMY WITH RADIOACTIVE SEED LOCALIZATION;  Surgeon: ByerlyStark Klein Location: [facility] [place]: General;  Laterality: Left;  . BREAST LUMPECTOMY WITH RADIOACTIVE SEED LOCALIZATION Right 11/18/2019   Procedure: RIGHT BREAST LUMPECTOMY WITH RADIOACTIVE SEED LOCALIZATION;  Surgeon: ByerlyStark Klein Location: [facility];[place]: [place];  Laterality: Right;  . COLONOSCOPY W/ BIOPSIES AND POLYPECTOMY    . DIAGNOSTIC LAPAROSCOPY     uterine polyps  . RE-EXCISION OF BREAST LUMPECTOMY Right 12/22/2019   Procedure: RIGHT BREAST RE-EXCISION OF BREAST LUMPECTOMY;  Surgeon: ByerlyStark Klein Location: [facility];[place]: General;  Laterality: Right;  . WISDOM TOOTH EXTRACTION      I have reviewed the social history and family history with the patient and they are unchanged from previous note.  ALLERGIES:  has No Known Allergies.  MEDICATIONS:  Current Outpatient Medications  Medication Sig Dispense Refill  . amLODipine (NORVASC) 5 MG tablet Take 5 mg by mouth daily.    Marland Kitchen aspirin EC 81 MG tablet Take 81 mg by mouth 3 (three) times a week. Swallow whole.     Marland Kitchen atorvastatin (LIPITOR) 10 MG tablet Take 10 mg by mouth every other day.     . hydrochlorothiazide (MICROZIDE) 12.5 MG capsule Take 12.5 mg by mouth daily.    . tamoxifen (NOLVADEX) 10 MG tablet Take 2 tablets (20 mg total) by mouth daily. 60  tablet 2   No current facility-administered medications for this visit.    PHYSICAL EXAMINATION: ECOG PERFORMANCE STATUS: 0 - Asymptomatic   Vitals:   04/12/20 0955  BP: (!) 129/52  Pulse: 83  Resp: 14  Temp: 97.9 F (36.6 C)  SpO2: 100%   Filed Weights   04/12/20 0955  Weight: 187 lb 8 oz (85 kg)    Due to COVID19 we will limit examination to appearance. Patient had no complaints.  GENERAL:alert, no distress and comfortable SKIN: skin color normal, no rashes or significant lesions EYES: normal, Conjunctiva are pink and non-injected, sclera clear  NEURO: alert & oriented x 3 with fluent speech   LABORATORY DATA:  I have reviewed the data as listed CBC Latest Ref Rng & Units 12/22/2019 11/09/2019 09/03/2017  WBC 4.0 - 10.5 K/uL 3.1(L) 2.9(L) 3.4(L)  Hemoglobin 12.0 - 15.0 g/dL 13.0 12.8 13.5  Hematocrit 36.0 - 46.0 % 39.5 39.0 40.1  Platelets 150 - 400 K/uL 148(L) 143(L) 177     CMP Latest Ref Rng & Units 12/22/2019 11/09/2019 09/03/2017  Glucose 70 - 99 mg/dL 94 97 101  BUN 8 - 23 mg/dL '9 8 7  ' Creatinine 0.44 - 1.00 mg/dL 0.71 0.61 0.74  Sodium 135 - 145 mmol/L 141 141 142  Potassium 3.5 - 5.1 mmol/L 3.0(L) 3.1(L) 3.2(L)  Chloride 98 - 111 mmol/L 104 103 102  CO2 22 - 32 mmol/L 27 28 32(H)  Calcium 8.9 - 10.3 mg/dL 9.6 9.5 10.5(H)  Total Protein 6.5 - 8.1 g/dL 6.8 - 7.5  Total Bilirubin 0.3 - 1.2 mg/dL 1.8(H) - 1.9(H)  Alkaline Phos 38 - 126 U/L 64 - 73  AST 15 - 41 U/L 26 - 22  ALT 0 - 44 U/L 36 - 40      RADIOGRAPHIC STUDIES: I have personally reviewed the radiological images as listed and agreed with the findings in the report. No results found.   ASSESSMENT & PLAN:  Victoria Fuller is a 69 y.o. female with    1.Ductal Carcinoma in situ (DCIS) ofRightbreast, ER/PR+, Intermediate grade -She was diagnosed in 09/2019. She is s/p right breast lumpectomy with Dr Barry Dienes on 11/18/19. Given her lateral positive margin she underwent right breast re-excision  surgery on 12/22/19 with Dr Barry Dienes and no further malignancy was found.  -She recently completed adjuvant radiation with Dr Lisbeth Renshaw on 01/24/20-02/18/20.  -She previously declined genetic testing.  -Given her strongly positive ER and PR, and second episode of DCIS, I started her on antiestrogen therapy with Tamoxifen on 02/28/20. Plan for 5 years. Given concern for side effects I started her Tamoxifen at 74m.  -She has tolerated start of 120mTamoxifen well with mild fatigue, mild weight gain and hot flashes. I discussed increasing active level and watching her diet. If her hot flashes worsen I discussed options of SSRI for management. I will refill her Tamoxifen at 1036m  and encouraged her to increase to 5m in the next few months. She voiced she is willing to try.  -Proceed with survivorship clinic with NP Lacie on 06/12/20   2. 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 She is s/p leftlumpectomyand adjuvant radiation. -At the time shedeclinedchemoprevention with antiestrogen therapy.   3. Hypertension and dyslipidemia, weight gain -Continue medication and follow-up with primary care physician   PLAN: -I refilled 178mTamoxifen today, she can try to increase to 2026mn the next few months.  -Lab and survivorship clinic with NP Laice on 06/12/20   No problem-specific Assessment & Plan notes found for this encounter.   No orders of the defined types were placed in this encounter.  All questions were answered. The patient knows to call the clinic with any problems, questions or concerns. No barriers to learning was detected. The total time spent in the appointment was 20 minutes.    YanTruitt MerleD 04/12/2020   I, AmoJoslyn Devonm acting as scribe for YanTruitt MerleD.   I have reviewed the above documentation for accuracy and completeness, and I agree with the above.

## 2020-04-12 ENCOUNTER — Encounter: Payer: Self-pay | Admitting: Hematology

## 2020-04-12 ENCOUNTER — Inpatient Hospital Stay: Payer: Medicare Other | Attending: Hematology | Admitting: Hematology

## 2020-04-12 ENCOUNTER — Other Ambulatory Visit: Payer: Self-pay

## 2020-04-12 VITALS — BP 129/52 | HR 83 | Temp 97.9°F | Resp 14 | Ht 69.0 in | Wt 187.5 lb

## 2020-04-12 DIAGNOSIS — D0512 Intraductal carcinoma in situ of left breast: Secondary | ICD-10-CM

## 2020-04-12 DIAGNOSIS — R232 Flushing: Secondary | ICD-10-CM | POA: Diagnosis not present

## 2020-04-12 DIAGNOSIS — Z923 Personal history of irradiation: Secondary | ICD-10-CM | POA: Insufficient documentation

## 2020-04-12 DIAGNOSIS — Z79899 Other long term (current) drug therapy: Secondary | ICD-10-CM | POA: Insufficient documentation

## 2020-04-12 DIAGNOSIS — R5383 Other fatigue: Secondary | ICD-10-CM | POA: Insufficient documentation

## 2020-04-12 DIAGNOSIS — I10 Essential (primary) hypertension: Secondary | ICD-10-CM | POA: Diagnosis not present

## 2020-04-12 DIAGNOSIS — E785 Hyperlipidemia, unspecified: Secondary | ICD-10-CM | POA: Insufficient documentation

## 2020-04-12 DIAGNOSIS — Z170 Estrogen receptor positive status [ER+]: Secondary | ICD-10-CM | POA: Insufficient documentation

## 2020-04-12 DIAGNOSIS — D0511 Intraductal carcinoma in situ of right breast: Secondary | ICD-10-CM | POA: Diagnosis not present

## 2020-04-12 MED ORDER — TAMOXIFEN CITRATE 10 MG PO TABS: 20.0000 mg | ORAL_TABLET | Freq: Every day | ORAL | 2 refills | Status: DC

## 2020-04-13 ENCOUNTER — Telehealth: Payer: Self-pay | Admitting: Hematology

## 2020-04-13 NOTE — Telephone Encounter (Signed)
No 12/15 los. No changes made to pt's schedule.  °

## 2020-06-08 ENCOUNTER — Other Ambulatory Visit: Payer: Self-pay

## 2020-06-08 DIAGNOSIS — D0512 Intraductal carcinoma in situ of left breast: Secondary | ICD-10-CM

## 2020-06-12 ENCOUNTER — Telehealth: Payer: Self-pay

## 2020-06-12 ENCOUNTER — Other Ambulatory Visit: Payer: Self-pay

## 2020-06-12 ENCOUNTER — Inpatient Hospital Stay: Payer: Medicare Other

## 2020-06-12 ENCOUNTER — Inpatient Hospital Stay: Payer: Medicare Other | Attending: Hematology | Admitting: Nurse Practitioner

## 2020-06-12 ENCOUNTER — Encounter: Payer: Self-pay | Admitting: Nurse Practitioner

## 2020-06-12 VITALS — BP 135/90 | HR 87 | Temp 97.5°F | Resp 19 | Ht 69.0 in | Wt 187.6 lb

## 2020-06-12 DIAGNOSIS — E876 Hypokalemia: Secondary | ICD-10-CM | POA: Diagnosis not present

## 2020-06-12 DIAGNOSIS — Z79899 Other long term (current) drug therapy: Secondary | ICD-10-CM | POA: Insufficient documentation

## 2020-06-12 DIAGNOSIS — D0511 Intraductal carcinoma in situ of right breast: Secondary | ICD-10-CM

## 2020-06-12 DIAGNOSIS — R232 Flushing: Secondary | ICD-10-CM | POA: Diagnosis not present

## 2020-06-12 DIAGNOSIS — D0512 Intraductal carcinoma in situ of left breast: Secondary | ICD-10-CM | POA: Insufficient documentation

## 2020-06-12 LAB — CBC WITH DIFFERENTIAL (CANCER CENTER ONLY)
Abs Immature Granulocytes: 0 10*3/uL (ref 0.00–0.07)
Basophils Absolute: 0 10*3/uL (ref 0.0–0.1)
Basophils Relative: 1 %
Eosinophils Absolute: 0.1 10*3/uL (ref 0.0–0.5)
Eosinophils Relative: 2 %
HCT: 38.6 % (ref 36.0–46.0)
Hemoglobin: 13.2 g/dL (ref 12.0–15.0)
Immature Granulocytes: 0 %
Lymphocytes Relative: 26 %
Lymphs Abs: 0.8 10*3/uL (ref 0.7–4.0)
MCH: 30 pg (ref 26.0–34.0)
MCHC: 34.2 g/dL (ref 30.0–36.0)
MCV: 87.7 fL (ref 80.0–100.0)
Monocytes Absolute: 0.3 10*3/uL (ref 0.1–1.0)
Monocytes Relative: 11 %
Neutro Abs: 1.7 10*3/uL (ref 1.7–7.7)
Neutrophils Relative %: 60 %
Platelet Count: 160 10*3/uL (ref 150–400)
RBC: 4.4 MIL/uL (ref 3.87–5.11)
RDW: 12.6 % (ref 11.5–15.5)
WBC Count: 2.9 10*3/uL — ABNORMAL LOW (ref 4.0–10.5)
nRBC: 0 % (ref 0.0–0.2)

## 2020-06-12 LAB — CMP (CANCER CENTER ONLY)
ALT: 18 U/L (ref 0–44)
AST: 14 U/L — ABNORMAL LOW (ref 15–41)
Albumin: 4.2 g/dL (ref 3.5–5.0)
Alkaline Phosphatase: 51 U/L (ref 38–126)
Anion gap: 7 (ref 5–15)
BUN: 9 mg/dL (ref 8–23)
CO2: 29 mmol/L (ref 22–32)
Calcium: 9.5 mg/dL (ref 8.9–10.3)
Chloride: 104 mmol/L (ref 98–111)
Creatinine: 0.78 mg/dL (ref 0.44–1.00)
GFR, Estimated: >60 mL/min (ref >60)
Glucose, Bld: 113 mg/dL — ABNORMAL HIGH (ref 70–99)
Potassium: 3.4 mmol/L — ABNORMAL LOW (ref 3.5–5.1)
Sodium: 140 mmol/L (ref 135–145)
Total Bilirubin: 1.3 mg/dL — ABNORMAL HIGH (ref 0.3–1.2)
Total Protein: 6.9 g/dL (ref 6.5–8.1)

## 2020-06-12 NOTE — Telephone Encounter (Signed)
Call placed per Regan Rakers B, NP. No answer

## 2020-06-12 NOTE — Progress Notes (Signed)
CLINIC: Survivorship   Patient Care Team: Donald Prose, MD as PCP - General (Family Medicine) Stark Klein, MD as Consulting Physician (General Surgery) Truitt Merle, MD as Consulting Physician (Hematology) Kyung Rudd, MD as Consulting Physician (Radiation Oncology) Delice Bison Charlestine Massed, NP as Nurse Practitioner (Hematology and Oncology) Alla Feeling, NP as Nurse Practitioner (Nurse Practitioner)   I connected with Clydell Hakim on 06/12/20 at  9:45 AM EST by Webex video and verified that I am speaking with the correct person using two identifiers.  I discussed the limitations, risks, security and privacy concerns of performing an evaluation and management service by telephone and the availability of in person appointments. I also discussed with the patient that there may be a patient responsible charge related to this service. The patient expressed understanding and agreed to proceed.   Others attending the visit: None  Patient's location: [place] med onc exam room Provider's location: home office   BRIEF ONCOLOGIC HISTORY:  Oncology History Overview Note  Cancer Staging Ductal carcinoma in situ (DCIS) of left breast Staging form: Breast, AJCC 8th Edition - Clinical stage from 08/25/2017: Stage 0 (cTis (DCIS), cN0, cM0, ER: Unknown, PR: Unknown, HER2: Not Assessed) - Signed by Truitt Merle, MD on 09/03/2017 Nuclear grade: G2 Laterality: Left - Pathologic: Stage 0 (pTis (DCIS), pN0, cM0, ER+, PR+) - Unsigned  Ductal carcinoma in situ (DCIS) of right breast Staging form: Breast, AJCC 8th Edition - Clinical stage from 10/06/2019: Stage 0 (cTis (DCIS), cN0, cM0, ER+, PR+, HER2: Not Assessed) - Signed by Alla Feeling, NP on 06/12/2020 Stage prefix: Initial diagnosis Nuclear grade: G2 - Pathologic stage from 06/12/2020: Stage 0 (pTis (DCIS), pN0, cM0, ER+, PR+, HER2: Not Assessed) - Signed by Alla Feeling, NP on 06/12/2020 Nuclear grade: G2     Ductal carcinoma in situ (DCIS) of left  breast  08/21/2017 Mammogram   IMPRESSION: New grouped pleomorphic calcifications within the upper-outer quadrant of the LEFT breast, spanning 4 mm extent. This is a suspicious finding for which stereotactic biopsy is recommended.   08/25/2017 Initial Biopsy   Diagnosis 08/25/17 Breast, left, needle core biopsy, upper outer quadrant coil clip - DUCTAL CARCINOMA IN SITU WITH CALCIFICATIONS, INTERMEDIATE GRADE. - DUCTAL PAPILLOMA. - SEE MICROSCOPIC DESCRIPTION.   08/25/2017 Cancer Staging   Staging form: Breast, AJCC 8th Edition - Clinical stage from 08/25/2017: Stage 0 (cTis (DCIS), cN0, cM0, ER: Unknown, PR: Unknown, HER2: Not Assessed) - Signed by Truitt Merle, MD on 09/03/2017   08/29/2017 Initial Diagnosis   Ductal carcinoma in situ (DCIS) of left breast   09/10/2017 Surgery   left BREAST LUMPECTOMY WITH RADIOACTIVE SEED LOCALIZATION by Stark Klein, MD   09/10/2017 Pathology Results   09/10/2017 Surgical Pathology Diagnosis Breast, lumpectomy - DUCTAL CARCINOMA IN SITU, INTERMEDIATE NUCLEAR GRADE, WITH CALCIFICATIONS - MARGINS UNINVOLVED BY CARCINOMA - DUCT ECTASIA AND PERIDUCTULAR CHRONIC INFLAMMATION - FIBROADENOMATOID NODULE - PREVIOUS BIOPSY SITE CHANGES - SEE ONCOLOGY TABLE BELOW     10/08/2017 - 11/06/2017 Radiation Therapy   The patient initially received a dose of 42.56 Gy in 16 fractions to the breast using whole-breast tangent fields. This was delivered using a 3-D conformal technique. The patient then received a boost to the seroma. This delivered an additional 8 Gy in 4 fractions using a 3 field photon technique due to the depth of the seroma. The total dose was 50.56 Gy.    Anti-estrogen oral therapy   She declined anti-estrogen therapy due to concerns with side effects.  Ductal carcinoma in situ (DCIS) of right breast  09/29/2019 Mammogram   Diagnostic Mammogram on 09/29/19  IMPRESSION: Suspicious calcifications spanning 58m in the 12 o'clock region of the  right breast.   10/06/2019 Initial Biopsy   Diagnosis Breast, right, needle core biopsy, upper central, coil clip - DUCTAL CARCINOMA IN SITU WITH CALCIFICATIONS, INTERMEDIATE NUCLEAR GRADE. - SEE MICROSCOPIC DESCRIPTION. Microscopic Comment Estrogen and progesterone receptors will be performed. PROGNOSTIC INDICATOR RESULTS: Immunohistochemical and morphometric analysis performed manually Estrogen Receptor: 100%, STRONG STAINING INTESITY Progesterone Receptor: 80%, STRONG STAINING INTESITY   10/06/2019 Cancer Staging   Staging form: Breast, AJCC 8th Edition - Clinical stage from 10/06/2019: Stage 0 (cTis (DCIS), cN0, cM0, ER+, PR+, HER2: Not Assessed) - Signed by BAlla Feeling NP on 06/12/2020 Stage prefix: Initial diagnosis Nuclear grade: G2   10/13/2019 Initial Diagnosis   Ductal carcinoma in situ (DCIS) of right breast   11/18/2019 Surgery   RIGHT BREAST LUMPECTOMY WITH RADIOACTIVE SEED LOCALIZATION with Dr BBarry Dienes   11/18/2019 Pathology Results   FINAL MICROSCOPIC DIAGNOSIS:   A. BREAST, RIGHT, LUMPECTOMY:  - Ductal carcinoma in situ with calcifications, intermediate grade with  focal necrosis, 1.3 cm  - No evidence of invasive carcinoma  - DCIS focally involves the lateral margin and is 0.3 cm from posterior  margin  - Atypical ductal hyperplasia, see comment  - Biopsy site changes  - See oncology table    COMMENT:   The small focus of ADH has a dissimilar appearance from rest of the DCIS  and is about 1 mm from the posterior margin.    12/22/2019 Surgery   RIGHT BREAST RE-EXCISION OF BREAST LUMPECTOMY with Dr BBarry Dienes  12/22/2019 Pathology Results   FINAL MICROSCOPIC DIAGNOSIS:   A. BREAST, RIGHT, LATERAL MARGIN, RE-EXCISION:  - Resection site changes.  - Fibrocystic change.  - No malignancy identified.    01/24/2020 - 02/18/2020 Radiation Therapy   Adjuvant Radiation with Dr MLisbeth Renshaw   02/2020 -  Anti-estrogen oral therapy   Tamoxifen 226mstarting 02/2020, will  start at 1037mnd if tolerable will increase 68m80m  06/12/2020 Survivorship   SCP reviewed virtually by LaciCira Rue. To be mailed to patient after visit    06/12/2020 Cancer Staging   Staging form: Breast, AJCC 8th Edition - Pathologic stage from 06/12/2020: Stage 0 (pTis (DCIS), pN0, cM0, ER+, PR+, HER2: Not Assessed) - Signed by BurtAlla Feeling on 06/12/2020 Nuclear grade: G2     INTERVAL HISTORY:  Ms. WheaUtleyreview her survivorship care plan detailing her treatment course for DCIS, as well as monitoring long-term side effects of that treatment, education regarding health maintenance, screening, and overall wellness and health promotion.     Overall, Ms. WheaMonicadoing well. Skin has recovered nicely from radiation. She increased tamoxifen to full dose in January, noticing more hot flashes at night that are moderate. This keeps her from having restful sleep but she can tolerate. Has right arm stiffness from the position she sleeps. She doesn't like that she's gaining weight. Denies breast concerns, notes she is feeling scar tissue. Mood is good. Denies bleeding, signs of thrombosis, bone/joint pain, fever, chills, cough, chest pain, dyspnea, or other concerns.    ONCOLOGY TREATMENT TEAM:  1. Surgeon:  [facility] 2. Medical Oncologist: Dr. FengBurr MedicoRadiation Oncologist: Dr. MoodLisbeth RenshawPAST MEDICAL/SURGICAL HISTORY:  Past Medical History:  Diagnosis Date  . Cancer (HCC)[place]/2019  Left breast cancer  . Hyperlipidemia   . Hypertension   . Wears contact lenses    Past Surgical History:  Procedure Laterality Date  . ABDOMINAL HYSTERECTOMY    . BREAST LUMPECTOMY Left 2019  . BREAST LUMPECTOMY WITH RADIOACTIVE SEED LOCALIZATION Left 09/10/2017   Procedure: BREAST LUMPECTOMY WITH RADIOACTIVE SEED LOCALIZATION;  Surgeon: Stark Klein, MD;  Location: [place];  Service: General;  Laterality: Left;  . BREAST LUMPECTOMY WITH  RADIOACTIVE SEED LOCALIZATION Right 11/18/2019   Procedure: RIGHT BREAST LUMPECTOMY WITH RADIOACTIVE SEED LOCALIZATION;  Surgeon: Stark Klein, MD;  Location: [place];  Service: General;  Laterality: Right;  . COLONOSCOPY W/ BIOPSIES AND POLYPECTOMY    . DIAGNOSTIC LAPAROSCOPY     uterine polyps  . RE-EXCISION OF BREAST LUMPECTOMY Right 12/22/2019   Procedure: RIGHT BREAST RE-EXCISION OF BREAST LUMPECTOMY;  Surgeon: Stark Klein, MD;  Location: [place];  Service: General;  Laterality: Right;  . WISDOM TOOTH EXTRACTION       ALLERGIES:  No Known Allergies   CURRENT MEDICATIONS:  Outpatient Encounter Medications as of 06/12/2020  Medication Sig Note  . amLODipine (NORVASC) 5 MG tablet Take 5 mg by mouth daily.   Marland Kitchen aspirin EC 81 MG tablet Take 81 mg by mouth 3 (three) times a week. Swallow whole. 12/15/2019: On hold for procedure  . atorvastatin (LIPITOR) 10 MG tablet Take 10 mg by mouth every other day.    . hydrochlorothiazide (MICROZIDE) 12.5 MG capsule Take 12.5 mg by mouth daily.   . Multiple Vitamin (MULTIVITAMIN WITH MINERALS) TABS tablet Take 1 tablet by mouth daily.   . tamoxifen (NOLVADEX) 10 MG tablet Take 2 tablets (20 mg total) by mouth daily.    No facility-administered encounter medications on file as of 06/12/2020.     ONCOLOGIC FAMILY HISTORY:  Family History  Problem Relation Age of Onset  . Cancer Mother 55       ovarian cancer   . Prostate cancer Father      GENETIC COUNSELING/TESTING: None  SOCIAL HISTORY:  Social History   Socioeconomic History  . Marital status: Married    Spouse name: Not on file  . Number of children: Not on file  . Years of education: Not on file  . Highest education level: Not on file  Occupational History  . Not on file  Tobacco Use  . Smoking status: Never Smoker  . Smokeless tobacco: Never Used  Vaping Use  . Vaping Use: Never used  Substance and Sexual Activity  . Alcohol use: Yes    Comment: rare  . Drug use: Never  .  Sexual activity: Yes  Other Topics Concern  . Not on file  Social History Narrative  . Not on file   Social Determinants of Health   Financial Resource Strain: Not on file  Food Insecurity: Not on file  Transportation Needs: Not on file  Physical Activity: Not on file  Stress: Not on file  Social Connections: Not on file  Intimate Partner Violence: Not on file     OBSERVATIONS/OBJECTIVE:  Patient appears well via video, speech is clear and intact. Mood/affect appear normal. No cough or conversational dyspnea  LABORATORY DATA:  CBC Latest Ref Rng & Units 06/12/2020 12/22/2019 11/09/2019  WBC 4.0 - 10.5 K/uL 2.9(L) 3.1(L) 2.9(L)  Hemoglobin 12.0 - 15.0 g/dL 13.2 13.0 12.8  Hematocrit 36.0 - 46.0 % 38.6 39.5 39.0  Platelets 150 - 400 K/uL 160 148(L) 143(L)   CMP Latest Ref Rng &  Units 06/12/2020 12/22/2019 11/09/2019  Glucose 70 - 99 mg/dL 113(H) 94 97  BUN 8 - 23 mg/dL _0 Creatinine 0.44 - 1.00 mg/dL 0.78 0.71 0.61  Sodium 135 - 145 mmol/L 140 141 141  Potassium 3.5 - 5.1 mmol/L 3.4(L) 3.0(L) 3.1(L)  Chloride 98 - 111 mmol/L 104 104 103  CO2 22 - 32 mmol/L _1 Calcium 8.9 - 10.3 mg/dL 9.5 9.6 9.5  Total Protein 6.5 - 8.1 g/dL 6.9 6.8 -  Total Bilirubin 0.3 - 1.2 mg/dL 1.3(H) 1.8(H) -  Alkaline Phos 38 - 126 U/L 51 64 -  AST 15 - 41 U/L 14(L) 26 -  ALT 0 - 44 U/L 18 36 -    DIAGNOSTIC IMAGING:  None for this visit.     ASSESSMENT AND PLAN:  Ms.. Reardon is a pleasant 70 y.o. female with Stage 0 right breast DCIS, ER+PR+/HER2-, diagnosed in 09/2019, treated with lumpectomy, adjuvant radiation therapy, and anti-estrogen therapy with Tamoxifen beginning in 01/2020.  She presents to the Survivorship Clinic for our initial meeting and routine follow-up post-completion of treatment for breast cancer.    1. Stage 0 DCIS right breast cancer:  Ms. Holton has recovered well from definitive treatment for breast cancer. Labs reviewed, mild leukopenia, hypokalemia, and  hyperbilirubinemia are stable to slightly improved. continue infection precautions. Increase K in your diet. Breast exam deferred in today's virtual visit. She will follow-up with her surgeon next month and medical oncologist, Dr. Burr Medico in 4 months with history and physical exam per surveillance protocol.  She will continue her anti-estrogen therapy with Tamoxifen. Thus far, she is tolerating moderately well with moderate hot flashes. She was instructed to make Dr. Burr Medico or myself aware if she begins to experience any worsening side effects of the medication and I could see her back in clinic to help manage those side effects, as needed. Her mammogram is due 09/2019; orders placed today. Given her h/o left breast DCIS in 2019, and R breast DCIS in 2021, she would likely benefit from additional screening tool breast MRI alternating with mammogram, she is interested. Today, a comprehensive survivorship care plan and treatment summary was reviewed with the patient today detailing her breast cancer diagnosis, treatment course, potential late/long-term effects of treatment, appropriate follow-up care with recommendations for the future, and patient education resources.  A copy of this summary, along with a letter will be sent to the patient's primary care provider via In Basket message after today's visit.    2. Hot flashes: Secondary to tamoxifen, worsened in 04/2020 when she increased to full dose. Moderate, but tolerable. We discussed medication management with SSRI vs gabapentin, she declined. Will monitor for now.  3. Bone health:  Given Ms. Folkert's age/history of breast cancer, she is at risk for bone demineralization.  Her last DEXA scan was normal in 06/2017, will repeat in 09/2020 with mammogram.  In the meantime, she was encouraged to increase her consumption of foods rich in calcium, as well as increase her weight-bearing activities.  She was given education on specific activities to promote bone  health.  4. Cancer screening:  Due to Ms. Hoselton's history and her age, she should receive screening for skin cancers, colon cancer, and gynecologic cancers. She is s/p hysterectomy. She is UTD on colonoscopy per patient. The information and recommendations are listed on the patient's comprehensive care plan/treatment summary and were reviewed in detail with the patient.    5. Health maintenance and wellness promotion:  Ms. Happ was encouraged to consume 5-7 servings of fruits and vegetables per day. We reviewed the "Nutrition Rainbow" handout, as well as the handout "Take Control of Your Health and Reduce Your Cancer Risk" from the [facility].  She was also encouraged to engage in moderate to vigorous exercise for 30 minutes per day most days of the week. We discussed the [employer] fitness program, which is designed for cancer survivors to help them become more physically fit after cancer treatments.  She was instructed to limit her alcohol consumption and continue to abstain from tobacco use.     6. Support services/counseling: It is not uncommon for this period of the patient's cancer care trajectory to be one of many emotions and stressors.  We discussed how this can be increasingly difficult during the times of quarantine and social distancing due to the COVID-19 pandemic.   She was given information regarding our available services and encouraged to contact me with any questions or for help enrolling in any of our support group/programs.    Follow up instructions:    -Continue Tamoxifen -follow up with surgery Dr. Barry Dienes in 06/2020 -Return to cancer center in 09/2020  -Mammogram due in 09/2020 -referral to PT for right arm stiffness  -She is welcome to return back to the Survivorship Clinic at any time; no additional follow-up needed at this time.  -Consider referral back to survivorship as a long-term survivor for continued surveillance  Orders Placed This Encounter   Procedures  . MM DIAG BREAST TOMO BILATERAL    Standing Status:   Future    Standing Expiration Date:   06/12/2021    Order Specific Question:   Reason for Exam (SYMPTOM  OR DIAGNOSIS REQUIRED)    Answer:   annual, h/o bilateral DCIS    Order Specific Question:   Preferred imaging location?    Answer:   [facility]  . DG Bone Density    Standing Status:   Future    Standing Expiration Date:   06/12/2021    Order Specific Question:   Reason for Exam (SYMPTOM  OR DIAGNOSIS REQUIRED)    Answer:   screening    Order Specific Question:   Preferred imaging location?    Answer:   [facility]  . Ambulatory referral to Physical Therapy    Referral Priority:   Routine    Referral Type:   Physical Medicine    Referral Reason:   Specialty Services Required    Requested Specialty:   Physical Therapy    Number of Visits Requested:   1    The patient was provided an opportunity to ask questions and all were answered. The patient agreed with the plan and demonstrated an understanding of the instructions.  The patient was advised to call back or seek an in-person evaluation if the symptoms worsen or if the condition fails to improve as anticipated.  I provided 30 minutes of non-face-to-face time during this encounter.   Alla Feeling, NP

## 2020-06-13 ENCOUNTER — Telehealth: Payer: Self-pay | Admitting: Hematology

## 2020-06-13 NOTE — Telephone Encounter (Signed)
Left message with follow-up appointment per 2/14 los. Gave option to call back to reschedule if needed. 

## 2020-06-22 ENCOUNTER — Ambulatory Visit: Payer: Medicare Other | Attending: Nurse Practitioner

## 2020-06-22 ENCOUNTER — Other Ambulatory Visit: Payer: Self-pay

## 2020-06-22 DIAGNOSIS — G8929 Other chronic pain: Secondary | ICD-10-CM | POA: Insufficient documentation

## 2020-06-22 DIAGNOSIS — Z483 Aftercare following surgery for neoplasm: Secondary | ICD-10-CM | POA: Insufficient documentation

## 2020-06-22 DIAGNOSIS — D0510 Intraductal carcinoma in situ of unspecified breast: Secondary | ICD-10-CM | POA: Insufficient documentation

## 2020-06-22 DIAGNOSIS — R293 Abnormal posture: Secondary | ICD-10-CM | POA: Insufficient documentation

## 2020-06-22 DIAGNOSIS — M25511 Pain in right shoulder: Secondary | ICD-10-CM | POA: Diagnosis not present

## 2020-06-22 NOTE — Patient Instructions (Signed)
Patient was instructed today in a home exercise program today for post op shoulder range of motion. These included active assist shoulder flexion in sitting/supine, scapular retraction, wall walking with shoulder abduction, and hands behind head external rotation in supine/sitting.  She was encouraged to do these twice a day, holding 3 seconds and repeating 5 times.

## 2020-06-22 NOTE — Therapy (Signed)
[place], [place], [address] Phone: [phone]   Fax:  [phone]  Physical Therapy Evaluation  Patient Details  Name: Victoria Fuller MRN: [id number] Date of Birth: Aug 28, 1950 Referring Provider (PT): Cira Rue PA   Encounter Date: 06/22/2020   PT End of Session - 06/22/20 4268    Visit Number 1    Number of Visits 12    Date for PT Re-Evaluation 08/03/20    PT Start Time 0800    PT Stop Time 0848    PT Time Calculation (min) 48 min    Activity Tolerance Patient tolerated treatment well    Behavior During Therapy [facility] for tasks assessed/performed           Past Medical History:  Diagnosis Date  . Cancer ([place]) 08/2017   Left breast cancer  . Hyperlipidemia   . Hypertension   . Wears contact lenses     Past Surgical History:  Procedure Laterality Date  . ABDOMINAL HYSTERECTOMY    . BREAST LUMPECTOMY Left 2019  . BREAST LUMPECTOMY WITH RADIOACTIVE SEED LOCALIZATION Left 09/10/2017   Procedure: BREAST LUMPECTOMY WITH RADIOACTIVE SEED LOCALIZATION;  Surgeon: Stark Klein, MD;  Location: [place];  Service: General;  Laterality: Left;  . BREAST LUMPECTOMY WITH RADIOACTIVE SEED LOCALIZATION Right 11/18/2019   Procedure: RIGHT BREAST LUMPECTOMY WITH RADIOACTIVE SEED LOCALIZATION;  Surgeon: Stark Klein, MD;  Location: [place];  Service: General;  Laterality: Right;  . COLONOSCOPY W/ BIOPSIES AND POLYPECTOMY    . DIAGNOSTIC LAPAROSCOPY     uterine polyps  . RE-EXCISION OF BREAST LUMPECTOMY Right 12/22/2019   Procedure: RIGHT BREAST RE-EXCISION OF BREAST LUMPECTOMY;  Surgeon: Stark Klein, MD;  Location: [place];  Service: General;  Laterality: Right;  . WISDOM TOOTH EXTRACTION      There were no vitals filed for this visit.    Subjective Assessment - 06/22/20 0759    Subjective Pt is having trouble sleeping on her right side because she is very tight in the right axillary  region.  She has to keep repositioning.  This also limits her exercises and reaching overhead. No problems with activities down low. No noticeable swelling. 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    Pertinent History Pt was diagnosed in 2019 with CA and underwent a Left Lumpectomy .  On 11/18/19 she had a Right Lumpectomy  followed by a re-excision on 12/22/19.  Her radiation ended on 02/18/20.  She had no LN's removed She is presently on Tamoxifen. Cancer was stage 0 on both occasions    Patient Stated Goals To improve shoulder tightness/ROM. Be able to sleep better    Currently in Pain? No/denies    Pain Score 0-No pain    Effect of Pain on Daily Activities Limited reaching ability, decreased sleep              [facility] PT Assessment - 06/22/20 0001      Assessment   Medical Diagnosis S/p Right Breast CA    Referring Provider (PT) Cira Rue PA    Onset Date/Surgical Date 12/22/19    Hand Dominance Right    Prior Therapy yes years ago in [place]      Precautions   Precautions None      Restrictions   Weight Bearing Restrictions No      Balance Screen  Has the patient fallen in the past 6 months No    Has the patient had a decrease in activity level because of a fear of falling?  No    Is the patient reluctant to leave their home because of a fear of falling?  No      Home Ecologist residence    Living Arrangements Spouse/significant other    Available Help at Discharge Family      Prior Function   Level of [place] Retired    Leisure walking, Psychologist, occupational, exercise      Cognition   Overall Cognitive Status Within Functional Limits for tasks assessed      Observation/Other Assessments   Observations skin darkening from radiation      Observation/Other Assessments-Edema    Edema --   Firmness noted right  upper breast with tenderness     Posture/Postural Control   Posture/Postural Control Postural limitations    Postural Limitations Rounded Shoulders;Forward head      AROM   Right Shoulder Extension 61 Degrees    Right Shoulder Flexion 156 Degrees   pulls in axilla down to chest   Right Shoulder ABduction 140 Degrees    Right Shoulder Internal Rotation 52 Degrees    Right Shoulder External Rotation 95 Degrees    Left Shoulder Extension 60 Degrees    Left Shoulder Flexion 155 Degrees    Left Shoulder ABduction 168 Degrees    Left Shoulder Internal Rotation 57 Degrees    Left Shoulder External Rotation 95 Degrees      Palpation   Palpation comment tender right UT and pecs, and right posterior shoulder             LYMPHEDEMA/ONCOLOGY QUESTIONNAIRE - 06/22/20 0001      Type   Cancer Type DCIS      Surgeries   Lumpectomy Date 12/22/19    Other Surgery Date 09/10/17   left lumpectomy     Treatment   Active Chemotherapy Treatment No    Past Chemotherapy Treatment No    Active Radiation Treatment No    Past Radiation Treatment Yes    Body Site bilateral breasts    Current Hormone Treatment Yes    Date --   03/2020   Drug Name Tamoxifen      What other symptoms do you have   Are you Having Heaviness or Tightness Yes    Are you having Pain Yes   intermittent with reaching/sleeping   Are you having pitting edema No    Is it Hard or Difficult finding clothes that fit No    Do you have infections No                 Quick Dash - 06/22/20 0001    Open a tight or new jar No difficulty    Do heavy household chores (wash walls, wash floors) No difficulty    Carry a shopping bag or briefcase No difficulty    Wash your back No difficulty    Use a knife to cut food No difficulty    Recreational activities in which you take some force or impact through your arm, shoulder, or hand (golf, hammering, tennis) No difficulty    During the past week, to what extent has your arm,  shoulder or hand problem interfered with your normal social activities with family, friends, neighbors, or groups? Not at all    During the past week,  to what extent has your arm, shoulder or hand problem limited your work or other regular daily activities Not at all    Arm, shoulder, or hand pain. Moderate    Tingling (pins and needles) in your arm, shoulder, or hand None    Difficulty Sleeping Severe difficulty    DASH Score 11.36 %            Objective measurements completed on examination: See above findings.               PT Education - 06/22/20 0836    Education Details Pt was educated in 4 post op exercises to improve shoulder ROM.  She was advised to try a sports or compression bra to see if this helps with breast firmness/?swelling    Person(s) Educated Patient    Methods Explanation;Demonstration;Handout    Comprehension Verbalized understanding;Returned demonstration               PT Long Term Goals - 06/22/20 1220      PT LONG TERM GOAL #1   Title Pt will be independent in a HEP for right shoulder ROM and strengthening    Time 4    Period Weeks    Status New    Target Date 07/20/20      PT LONG TERM GOAL #2   Title Pt will have decreased right breast tenderness/swelling by atleast 50%    Time 4    Period Weeks    Status New    Target Date 07/20/20      PT LONG TERM GOAL #3   Title Pt will be able to sleep on her right side with with minimal complaints    Time 6    Period Weeks    Status New    Target Date 08/03/20      PT LONG TERM GOAL #4   Title Pt will have right shoulder ABduction 165 degrees for improved reaching ability    Time 6    Period Weeks    Status New      PT LONG TERM GOAL #5   Title Pt will be independent with breast MLD prn    Time 6    Period Weeks    Status New    Target Date 08/03/20                  Plan - 06/22/20 1219    Clinical Impression Statement Pt has had bilateral breast lumpectomies without  SLNB secondary to DCIS, with most recent surgery 12/22/2019 on the right.  She presents with complaints of right axillary tightness/pain, and limitations in Right shoulder ROM.  She also has some palpable firmness in the upper to mid breast that is tender.  she was instructed to try a sports bra/compression bra.  She was also educated in 4 post op exercises to perform to the point of stretch but not pain to increase ROM with flexion and ER in sitting or supine. She will benefit from skilled therapy to address ROM restrictions, breast swelling/firmness and limitations in function    Examination-Activity Limitations Reach Overhead;Sleep    Examination-Participation Restrictions Cleaning    Stability/Clinical Decision Making Stable/Uncomplicated    Rehab Potential Excellent    PT Frequency 2x / week    PT Duration 6 weeks    PT Next Visit Plan Assess benefit of sports bra on breast, add chip pack prn, add supine wand flex and scaption, PROM right shoulder, MLD instruction. Pt will be  in [facility] after next week and will return in 10 days    PT Home Exercise Plan 4 post op exercises    Consulted and Agree with Plan of Care Patient           Patient will benefit from skilled therapeutic intervention in order to improve the following deficits and impairments:  Decreased range of motion,Increased fascial restricitons,Impaired UE functional use,Decreased knowledge of precautions,Pain,Impaired flexibility,Decreased strength,Increased edema,Postural dysfunction  Visit Diagnosis: Abnormal posture  Chronic right shoulder pain  Aftercare following surgery for neoplasm  Ductal carcinoma in situ (DCIS) of breast, unspecified laterality     Problem List Patient Active Problem List   Diagnosis Date Noted  . 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 Ductal carcinoma in situ (DCIS) of left breast 08/29/2017    Claris Pong 06/22/2020, 12:29 PM  [place], [place], [address] Phone: [phone]   Fax:  [phone]  Name: Victoria Fuller MRN: [id number] Date of Birth: 1951-03-18  Cheral Almas, PT 06/22/20 12:31 PM

## 2020-06-27 ENCOUNTER — Ambulatory Visit: Payer: Medicare Other

## 2020-06-29 ENCOUNTER — Ambulatory Visit: Payer: Medicare Other | Attending: Nurse Practitioner

## 2020-06-29 DIAGNOSIS — D0510 Intraductal carcinoma in situ of unspecified breast: Secondary | ICD-10-CM | POA: Insufficient documentation

## 2020-06-29 DIAGNOSIS — G8929 Other chronic pain: Secondary | ICD-10-CM | POA: Insufficient documentation

## 2020-06-29 DIAGNOSIS — Z483 Aftercare following surgery for neoplasm: Secondary | ICD-10-CM | POA: Insufficient documentation

## 2020-06-29 DIAGNOSIS — R293 Abnormal posture: Secondary | ICD-10-CM | POA: Insufficient documentation

## 2020-06-29 DIAGNOSIS — M25511 Pain in right shoulder: Secondary | ICD-10-CM | POA: Insufficient documentation

## 2020-06-30 ENCOUNTER — Ambulatory Visit: Payer: Medicare Other

## 2020-06-30 ENCOUNTER — Other Ambulatory Visit: Payer: Self-pay

## 2020-06-30 DIAGNOSIS — M25511 Pain in right shoulder: Secondary | ICD-10-CM | POA: Diagnosis not present

## 2020-06-30 DIAGNOSIS — D0510 Intraductal carcinoma in situ of unspecified breast: Secondary | ICD-10-CM

## 2020-06-30 DIAGNOSIS — G8929 Other chronic pain: Secondary | ICD-10-CM

## 2020-06-30 DIAGNOSIS — Z483 Aftercare following surgery for neoplasm: Secondary | ICD-10-CM | POA: Diagnosis not present

## 2020-06-30 DIAGNOSIS — R293 Abnormal posture: Secondary | ICD-10-CM | POA: Diagnosis not present

## 2020-06-30 NOTE — Therapy (Signed)
[place], [place], [address] Phone: [phone]   Fax:  [phone]  Physical Therapy Treatment  Patient Details  Name: Victoria Fuller MRN: [id number] Date of Birth: 05/13/1950 Referring Provider (PT): Cira Rue PA   Encounter Date: 06/30/2020   PT End of Session - 06/30/20 1040    Visit Number 2    Number of Visits 12    Date for PT Re-Evaluation 08/03/20    PT Start Time 5465    PT Stop Time 1046    PT Time Calculation (min) 44 min    Activity Tolerance Patient tolerated treatment well    Behavior During Therapy [facility] for tasks assessed/performed           Past Medical History:  Diagnosis Date  . Cancer ([place]) 08/2017   Left breast cancer  . Hyperlipidemia   . Hypertension   . Wears contact lenses     Past Surgical History:  Procedure Laterality Date  . ABDOMINAL HYSTERECTOMY    . BREAST LUMPECTOMY Left 2019  . BREAST LUMPECTOMY WITH RADIOACTIVE SEED LOCALIZATION Left 09/10/2017   Procedure: BREAST LUMPECTOMY WITH RADIOACTIVE SEED LOCALIZATION;  Surgeon: Stark Klein, MD;  Location: [place];  Service: General;  Laterality: Left;  . BREAST LUMPECTOMY WITH RADIOACTIVE SEED LOCALIZATION Right 11/18/2019   Procedure: RIGHT BREAST LUMPECTOMY WITH RADIOACTIVE SEED LOCALIZATION;  Surgeon: Stark Klein, MD;  Location: [place];  Service: General;  Laterality: Right;  . COLONOSCOPY W/ BIOPSIES AND POLYPECTOMY    . DIAGNOSTIC LAPAROSCOPY     uterine polyps  . RE-EXCISION OF BREAST LUMPECTOMY Right 12/22/2019   Procedure: RIGHT BREAST RE-EXCISION OF BREAST LUMPECTOMY;  Surgeon: Stark Klein, MD;  Location: [place];  Service: General;  Laterality: Right;  . WISDOM TOOTH EXTRACTION      There were no vitals filed for this visit.   Subjective Assessment - 06/30/20 1001    Subjective Have been doing the exercises and the swelling is going down in my right breast. I can sleep on my  side a little better now. Have some tenderness at the upper breast but no real pain.    Pertinent History Pt was diagnosed in 2019 with CA and underwent a Left Lumpectomy .  On 11/18/19 she had a Rigght Lumpectomy  followed by a re-excision on 12/22/19.  Her radiation ended on 02/18/20.  She had no LN's removed She is presently on Tamoxifen. Cancer was stage ) on both occasions    Patient Stated Goals To improve shoulder tightness/ROM. Be able to sleep better    Currently in Pain? No/denies    Pain Score 0-No pain                             [facility] Adult PT Treatment/Exercise - 06/30/20 0001      Shoulder Exercises: Supine   Other Supine Exercises AA flex and scaption with wand x 5, and stargazer x5      Shoulder Exercises: Pulleys   Flexion 2 minutes    ABduction 2 minutes      Shoulder Exercises: Therapy Ball   Flexion Both;10 reps    ABduction Right;10 reps      Manual Therapy   Edema Management chip pack made for just proximal to incision area of firmness    Passive ROM Right shoulder flex, scaption, Abd, D2 flex, IR and ER  PT Education - 06/30/20 1040    Education Details Pt educated in supine wand exs for shoulder flexion and scaption.  She declined getting a handout    Person(s) Educated Patient    Methods Explanation;Demonstration    Comprehension Verbalized understanding;Returned demonstration               PT Long Term Goals - 06/22/20 1220      PT LONG TERM GOAL #1   Title Pt will be independent in a HEP for right shoulder ROM and strengthening    Time 4    Period Weeks    Status New    Target Date 07/20/20      PT LONG TERM GOAL #2   Title Pt will have decreased right breast tenderness/swelling by atleast 50%    Time 4    Period Weeks    Status New    Target Date 07/20/20      PT LONG TERM GOAL #3   Title Pt will be able to sleep on her right side with with minimal complaints    Time 6    Period Weeks     Status New    Target Date 08/03/20      PT LONG TERM GOAL #4   Title Pt will have right shoulder ABduction 165 degrees for improved reaching ability    Time 6    Period Weeks    Status New      PT LONG TERM GOAL #5   Title Pt will be independent with breast MLD prn    Time 6    Period Weeks    Status New    Target Date 08/03/20                 Plan - 06/30/20 1043    Clinical Impression Statement Pt. demonstrates good improvement in right shoulder ROM.  She is reaching better and is able to sleep on her right side some now. She relaxed well for PROM.  Area of right Breast swelling has decreased with wearing her sports bra, however there is some continued firmness at just proximal to nipple.  She was given a chip pack to address this area.  She may benefit from breast MLD and will assess next visit.  She leaves March 10 for [place]    Examination-Activity Limitations Reach Overhead;Sleep    Examination-Participation Restrictions Cleaning    Rehab Potential Excellent    PT Frequency 2x / week    PT Duration 6 weeks    PT Treatment/Interventions ADLs/Self Care Home Management;Therapeutic activities;Therapeutic exercise;Neuromuscular re-education;Manual techniques;Patient/family education;Manual lymph drainage;Passive range of motion;Scar mobilization    PT Next Visit Plan assess chip pack and breast firmness prox to nipple, Cont PROM, instruct breast MLD prn(pt leaves for [place] for 10 days on 07/06/20,exercises    PT Home Exercise Plan 4 post op exercises, supine wand flex and scaption    Consulted and Agree with Plan of Care Patient           Patient will benefit from skilled therapeutic intervention in order to improve the following deficits and impairments:  Decreased range of motion,Increased fascial restricitons,Impaired UE functional use,Decreased knowledge of precautions,Pain,Impaired flexibility,Decreased strength,Increased edema,Postural dysfunction  Visit  Diagnosis: Abnormal posture  Chronic right shoulder pain  Aftercare following surgery for neoplasm  Ductal carcinoma in situ (DCIS) of breast, unspecified laterality     Problem List Patient Active Problem List   Diagnosis Date Noted  . Ductal carcinoma in situ (DCIS)  of right breast 10/13/2019  . Ductal carcinoma in situ (DCIS) of left breast 08/29/2017    [name] Ra [facility] 06/30/2020, 10:53 AM  [place], [place], [address] Phone: [phone]   Fax:  [phone]  Name: Victoria Fuller MRN: [id number] Date of Birth: February 01, 1951 Cheral Almas, PT 06/30/20 10:54 AM

## 2020-07-04 ENCOUNTER — Ambulatory Visit: Payer: Medicare Other

## 2020-07-05 ENCOUNTER — Ambulatory Visit: Payer: Medicare Other

## 2020-07-05 ENCOUNTER — Other Ambulatory Visit: Payer: Self-pay

## 2020-07-05 DIAGNOSIS — R293 Abnormal posture: Secondary | ICD-10-CM | POA: Diagnosis not present

## 2020-07-05 DIAGNOSIS — M25511 Pain in right shoulder: Secondary | ICD-10-CM | POA: Diagnosis not present

## 2020-07-05 DIAGNOSIS — D0510 Intraductal carcinoma in situ of unspecified breast: Secondary | ICD-10-CM

## 2020-07-05 DIAGNOSIS — G8929 Other chronic pain: Secondary | ICD-10-CM | POA: Diagnosis not present

## 2020-07-05 DIAGNOSIS — Z483 Aftercare following surgery for neoplasm: Secondary | ICD-10-CM | POA: Diagnosis not present

## 2020-07-05 NOTE — Patient Instructions (Signed)

## 2020-07-05 NOTE — Therapy (Signed)
[place], [place], [address] Phone: [phone]   Fax:  [phone]  Physical Therapy Treatment  Patient Details  Name: Victoria Fuller MRN: [id number] Date of Birth: [birth year] Referring Provider (PT): Cira Rue PA   Encounter Date: 07/05/2020   PT End of Session - 07/05/20 0827    Visit Number 3    Number of Visits 12    Date for PT Re-Evaluation 08/03/20    PT Start Time 0804    Activity Tolerance Patient tolerated treatment well    Behavior During Therapy [facility] for tasks assessed/performed           Past Medical History:  Diagnosis Date  . Cancer ([place]) 08/2017   Left breast cancer  . Hyperlipidemia   . Hypertension   . Wears contact lenses     Past Surgical History:  Procedure Laterality Date  . ABDOMINAL HYSTERECTOMY    . BREAST LUMPECTOMY Left 2019  . BREAST LUMPECTOMY WITH RADIOACTIVE SEED LOCALIZATION Left 09/10/2017   Procedure: BREAST LUMPECTOMY WITH RADIOACTIVE SEED LOCALIZATION;  Surgeon: Stark Klein, MD;  Location: [place];  Service: General;  Laterality: Left;  . BREAST LUMPECTOMY WITH RADIOACTIVE SEED LOCALIZATION Right 11/18/2019   Procedure: RIGHT BREAST LUMPECTOMY WITH RADIOACTIVE SEED LOCALIZATION;  Surgeon: Stark Klein, MD;  Location: [place];  Service: General;  Laterality: Right;  . COLONOSCOPY W/ BIOPSIES AND POLYPECTOMY    . DIAGNOSTIC LAPAROSCOPY     uterine polyps  . RE-EXCISION OF BREAST LUMPECTOMY Right 12/22/2019   Procedure: RIGHT BREAST RE-EXCISION OF BREAST LUMPECTOMY;  Surgeon: Stark Klein, MD;  Location: [place];  Service: General;  Laterality: Right;  . WISDOM TOOTH EXTRACTION      There were no vitals filed for this visit.   Subjective Assessment - 07/05/20 0803    Subjective Breast swelling is really going down.  The chip pack seems to be working.  ROM of right shoulder is also going well, and can sleep on right side longer.     Pertinent History Pt was diagnosed in 2019 with CA and underwent a Left Lumpectomy .  On 11/18/19 she had a Rigght Lumpectomy  followed by a re-excision on 12/22/19.  Her radiation ended on 02/18/20.  She had no LN's removed She is presently on Tamoxifen. Cancer was stage ) on both occasions    Currently in Pain? No/denies    Pain Score 0-No pain              [facility] PT Assessment - 07/05/20 0001      AROM   Right Shoulder Flexion 164 Degrees    Right Shoulder ABduction 164 Degrees                         [facility] Adult PT Treatment/Exercise - 07/05/20 0001      Shoulder Exercises: Supine   Horizontal ABduction Strengthening;Both;10 reps    Theraband Level (Shoulder Horizontal ABduction) Level 1 (Yellow)    External Rotation Strengthening;Both;10 reps    Theraband Level (Shoulder External Rotation) Level 1 (Yellow)    Flexion Strengthening;Both;10 reps    Theraband Level (Shoulder Flexion) Level 1 (Yellow)    Diagonals Strengthening;Right;Left;10 reps    Theraband Level (Shoulder Diagonals) Level 1 (Yellow)    Other Supine Exercises AA flex and scaption with wand x 5, and stargazer x5      Shoulder Exercises: Standing   Extension Strengthening;Both;10 reps    Theraband Level (  Shoulder Extension) Level 1 (Yellow)    Retraction Strengthening;Both;10 reps    Theraband Level (Shoulder Retraction) Level 1 (Yellow)      Shoulder Exercises: Therapy Ball   Flexion Both;10 reps    ABduction Right;10 reps      Manual Therapy   Edema Management chip pack has softened and decreased right breast edema   pt advised to wear sports bra on plane tomorrow   Passive ROM Right shoulder flex, scaption, Abd, D2 flex, IR and ER                  PT Education - 07/05/20 0827    Education Details pt was educated in supine scapular series with yellow theraband x 10    Person(s) Educated Patient    Methods Explanation;Demonstration;Handout;Verbal cues    Comprehension Returned  demonstration               PT Long Term Goals - 06/22/20 1220      PT LONG TERM GOAL #1   Title Pt will be independent in a HEP for right shoulder ROM and strengthening    Time 4    Period Weeks    Status New    Target Date 07/20/20      PT LONG TERM GOAL #2   Title Pt will have decreased right breast tenderness/swelling by atleast 50%    Time 4    Period Weeks    Status New    Target Date 07/20/20      PT LONG TERM GOAL #3   Title Pt will be able to sleep on her right side with with minimal complaints    Time 6    Period Weeks    Status New    Target Date 08/03/20      PT LONG TERM GOAL #4   Title Pt will have right shoulder ABduction 165 degrees for improved reaching ability    Time 6    Period Weeks    Status New      PT LONG TERM GOAL #5   Title Pt will be independent with breast MLD prn    Time 6    Period Weeks    Status New    Target Date 08/03/20                 Plan - 07/05/20 0843    Clinical Impression Statement Excellent improvement in shoulder ROM.  Very nice reduction in right breast swelling with good response to chip pack.  Pt advised to wear compression bra during the flight with chip pack to keep swelling down.  Pt did very well with supine scapular series, but required more cueing with standing exs.  Did not add standing exs to HEP    Examination-Activity Limitations Reach Overhead;Sleep    Examination-Participation Restrictions Cleaning    Stability/Clinical Decision Making Stable/Uncomplicated    Rehab Potential Excellent    PT Frequency 2x / week    PT Duration 6 weeks    PT Treatment/Interventions ADLs/Self Care Home Management;Therapeutic activities;Therapeutic exercise;Neuromuscular re-education;Manual techniques;Patient/family education;Manual lymph drainage;Passive range of motion;Scar mobilization    PT Next Visit Plan How was [place], reasess shooulder ROM, breast swelling, consider breast MLD prn    PT Home Exercise Plan 4  post op exercises, supine wand flex and scaption, supine scapular series x 10 with yellow TB    Consulted and Agree with Plan of Care Patient           Patient will  benefit from skilled therapeutic intervention in order to improve the following deficits and impairments:  Decreased range of motion,Increased fascial restricitons,Impaired UE functional use,Decreased knowledge of precautions,Pain,Impaired flexibility,Decreased strength,Increased edema,Postural dysfunction  Visit Diagnosis: Chronic right shoulder pain  Aftercare following surgery for neoplasm  Ductal carcinoma in situ (DCIS) of breast, unspecified laterality     Problem List Patient Active Problem List   Diagnosis Date Noted  . 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 Ductal carcinoma in situ (DCIS) of left breast 08/29/2017    Victoria Fuller 07/05/2020, 8:58 AM  [place], [place], [address] Phone: [phone]   Fax:  [phone]  Name: Victoria Fuller MRN: [id number] Date of Birth: 1950/12/14  Cheral Almas, PT 07/05/20 8:59 AM

## 2020-07-18 ENCOUNTER — Ambulatory Visit: Payer: Medicare Other

## 2020-07-20 ENCOUNTER — Ambulatory Visit: Payer: Medicare Other

## 2020-07-20 ENCOUNTER — Other Ambulatory Visit: Payer: Self-pay

## 2020-07-20 DIAGNOSIS — Z483 Aftercare following surgery for neoplasm: Secondary | ICD-10-CM

## 2020-07-20 DIAGNOSIS — G8929 Other chronic pain: Secondary | ICD-10-CM

## 2020-07-20 DIAGNOSIS — M25511 Pain in right shoulder: Secondary | ICD-10-CM | POA: Diagnosis not present

## 2020-07-20 DIAGNOSIS — D0510 Intraductal carcinoma in situ of unspecified breast: Secondary | ICD-10-CM

## 2020-07-20 DIAGNOSIS — R293 Abnormal posture: Secondary | ICD-10-CM | POA: Diagnosis not present

## 2020-07-20 NOTE — Patient Instructions (Signed)
Manual Lymph Drainage for Right Breast.  Do daily.  Do slowly. Use flat hands with just enough pressure to stretch the skin. Do not slide over the skin, but move the skin with the hand you're using. Lie down or sit comfortably (in a recliner, for example) to do this.  1) Hug yourself:  cross arms and do circles at collar bones near neck 5-7 times (to "wake up" lots of lymph nodes in this area). 2) Take slow deep breaths, allowing your belly to balloon out as your breathe in, 5x (to "wake up" abdominal lymph nodes to take on extra fluid). 3) Left armpit--stretch skin in small circles to stimulate intact lymph nodes there, 5-7x. 4) Right groin area, at panty line--stretch skin in small circles to stimulate lymph nodes 5-7x. 5) Redirect fluid from right chest toward left armpit (stretch skin starting at right chest in 3-4 spots working toward left armpit) 3-4x across the chest. 6) Redirect fluid from right armpit toward right groin (cup your hand around the curve of your right side and do 3-4 "pumps" from armpit to groin) 3-4x down your side. 7) Draw an imaginary diagonal line from upper outer breast through the nipple area toward lower inner breast.  Direct fluid upward and inward from this line toward the pathway across your upper chest (established in #5).  Do this in three rows to treat all of the upper inner breast tissue, and do each row 3-4x. 8) Then repeat #5 above. 9) From the imaginary diagonal, direct fluid in three rows (to treat all of lower outer breast tissue) downward and outward toward pathway established in #6 that is aimed at the right groin. 10)  Then repeat #6 above. 11)  End with repeating #3 and #4 above.   [facility] [address] [place], [place]   [address] [phone]  

## 2020-07-20 NOTE — Therapy (Signed)
[place], [place], [address] Phone: [phone]   Fax:  [phone]  Physical Therapy Treatment  Patient Details  Name: Victoria Fuller MRN: [id number] Date of Birth: 1950-05-12 Referring Provider (PT): Cira Rue PA   Encounter Date: 07/20/2020   PT End of Session - 07/20/20 1453    Visit Number 4    Number of Visits 12    Date for PT Re-Evaluation 08/03/20    PT Start Time 1410    PT Stop Time 1457    PT Time Calculation (min) 47 min    Activity Tolerance Patient tolerated treatment well    Behavior During Therapy [facility] for tasks assessed/performed           Past Medical History:  Diagnosis Date  . Cancer ([place]) 08/2017   Left breast cancer  . Hyperlipidemia   . Hypertension   . Wears contact lenses     Past Surgical History:  Procedure Laterality Date  . ABDOMINAL HYSTERECTOMY    . BREAST LUMPECTOMY Left 2019  . BREAST LUMPECTOMY WITH RADIOACTIVE SEED LOCALIZATION Left 09/10/2017   Procedure: BREAST LUMPECTOMY WITH RADIOACTIVE SEED LOCALIZATION;  Surgeon: Stark Klein, MD;  Location: [place];  Service: General;  Laterality: Left;  . BREAST LUMPECTOMY WITH RADIOACTIVE SEED LOCALIZATION Right 11/18/2019   Procedure: RIGHT BREAST LUMPECTOMY WITH RADIOACTIVE SEED LOCALIZATION;  Surgeon: Stark Klein, MD;  Location: [place];  Service: General;  Laterality: Right;  . COLONOSCOPY W/ BIOPSIES AND POLYPECTOMY    . DIAGNOSTIC LAPAROSCOPY     uterine polyps  . RE-EXCISION OF BREAST LUMPECTOMY Right 12/22/2019   Procedure: RIGHT BREAST RE-EXCISION OF BREAST LUMPECTOMY;  Surgeon: Stark Klein, MD;  Location: [place];  Service: General;  Laterality: Right;  . WISDOM TOOTH EXTRACTION      There were no vitals filed for this visit.   Subjective Assessment - 07/20/20 1411    Subjective Shoulder feels really good.  I didn't get to exercise too much on vacation.  Breast swelling seems  better too.  The chip pack really takes all the swelling away but If I don't wear it it comes back, but not as bad as initially.    Pertinent History Pt was diagnosed in 2019 with CA and underwent a Left Lumpectomy .  On 11/18/19 she had a Rigght Lumpectomy  followed by a re-excision on 12/22/19.  Her radiation ended on 02/18/20.  She had no LN's removed She is presently on Tamoxifen. Cancer was stage ) on both occasions    Patient Stated Goals To improve shoulder tightness/ROM. Be able to sleep better    Currently in Pain? No/denies    Pain Score 0-No pain                             [facility] Adult PT Treatment/Exercise - 07/20/20 0001      Manual Therapy   Edema Management chip pack has softened and decreased right breast edema      Manual Lymphatic Drainage (MLD) short neck, 5 breaths, Left and right axillary LN's right inguinal LN's, anterior interaxillary pathway,Right axillo-inguinal pathway, and right breast retracing all steps.    Passive ROM Right shoulder flex, scaption, Abd, D2 flex, IR and ER                  PT Education - 07/20/20 1451    Education Details Pt was educated in self right  breast MLD and given written instructions    Person(s) Educated Patient    Methods Demonstration    Comprehension Returned demonstration;Need further instruction               PT Long Term Goals - 06/22/20 1220      PT LONG TERM GOAL #1   Title Pt will be independent in a HEP for right shoulder ROM and strengthening    Time 4    Period Weeks    Status New    Target Date 07/20/20      PT LONG TERM GOAL #2   Title Pt will have decreased right breast tenderness/swelling by atleast 50%    Time 4    Period Weeks    Status New    Target Date 07/20/20      PT LONG TERM GOAL #3   Title Pt will be able to sleep on her right side with with minimal complaints    Time 6    Period Weeks    Status New    Target Date 08/03/20      PT LONG TERM GOAL #4   Title Pt  will have right shoulder ABduction 165 degrees for improved reaching ability    Time 6    Period Weeks    Status New      PT LONG TERM GOAL #5   Title Pt will be independent with breast MLD prn    Time 6    Period Weeks    Status New    Target Date 08/03/20                 Plan - 07/20/20 1456    Clinical Impression Statement Pt continues with good shoulder ROM.  Breast swelling reduced but still present and returns when she doesn't have chip pack in.  Pt instructed in self MLD today and did very well but requires additional review.  Likely DC next week    Examination-Activity Limitations Reach Overhead;Sleep    Rehab Potential Excellent    PT Frequency 2x / week    PT Duration 6 weeks    PT Treatment/Interventions ADLs/Self Care Home Management;Therapeutic activities;Therapeutic exercise;Neuromuscular re-education;Manual techniques;Patient/family education;Manual lymph drainage;Passive range of motion;Scar mobilization    PT Next Visit Plan DC? review self MLD, reassess ROM    PT Home Exercise Plan 4 post op exercises, supine wand flex and scaption, supine scapular series x 10 with yellow TB    Consulted and Agree with Plan of Care Patient           Patient will benefit from skilled therapeutic intervention in order to improve the following deficits and impairments:  Decreased range of motion,Increased fascial restricitons,Impaired UE functional use,Decreased knowledge of precautions,Pain,Impaired flexibility,Decreased strength,Increased edema,Postural dysfunction  Visit Diagnosis: Chronic right shoulder pain  Aftercare following surgery for neoplasm  Ductal carcinoma in situ (DCIS) of breast, unspecified laterality  Abnormal posture     Problem List Patient Active Problem List   Diagnosis Date Noted  . 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

## 2020-07-26 ENCOUNTER — Ambulatory Visit: Payer: Medicare Other

## 2020-07-26 ENCOUNTER — Other Ambulatory Visit: Payer: Self-pay

## 2020-07-26 DIAGNOSIS — D0510 Intraductal carcinoma in situ of unspecified breast: Secondary | ICD-10-CM | POA: Diagnosis not present

## 2020-07-26 DIAGNOSIS — R293 Abnormal posture: Secondary | ICD-10-CM | POA: Diagnosis not present

## 2020-07-26 DIAGNOSIS — M25511 Pain in right shoulder: Secondary | ICD-10-CM | POA: Diagnosis not present

## 2020-07-26 DIAGNOSIS — Z483 Aftercare following surgery for neoplasm: Secondary | ICD-10-CM

## 2020-07-26 DIAGNOSIS — G8929 Other chronic pain: Secondary | ICD-10-CM

## 2020-07-26 NOTE — Therapy (Signed)
[place], [place], [address] Phone: [phone]   Fax:  [phone]  Physical Therapy Treatment  Patient Details  Name: Victoria Fuller MRN: [id number] Date of Birth: Aug 11, 1950 Referring Provider (PT): Cira Rue PA   Encounter Date: 07/26/2020   PT End of Session - 07/26/20 1548    Visit Number 5    Number of Visits 12    Date for PT Re-Evaluation 08/03/20    PT Start Time 1503    PT Stop Time 1548    PT Time Calculation (min) 45 min    Activity Tolerance Patient tolerated treatment well    Behavior During Therapy [facility] for tasks assessed/performed           Past Medical History:  Diagnosis Date  . Cancer ([place]) 08/2017   Left breast cancer  . Hyperlipidemia   . Hypertension   . Wears contact lenses     Past Surgical History:  Procedure Laterality Date  . ABDOMINAL HYSTERECTOMY    . BREAST LUMPECTOMY Left 2019  . BREAST LUMPECTOMY WITH RADIOACTIVE SEED LOCALIZATION Left 09/10/2017   Procedure: BREAST LUMPECTOMY WITH RADIOACTIVE SEED LOCALIZATION;  Surgeon: Stark Klein, MD;  Location: [place];  Service: General;  Laterality: Left;  . BREAST LUMPECTOMY WITH RADIOACTIVE SEED LOCALIZATION Right 11/18/2019   Procedure: RIGHT BREAST LUMPECTOMY WITH RADIOACTIVE SEED LOCALIZATION;  Surgeon: Stark Klein, MD;  Location: [place];  Service: General;  Laterality: Right;  . COLONOSCOPY W/ BIOPSIES AND POLYPECTOMY    . DIAGNOSTIC LAPAROSCOPY     uterine polyps  . RE-EXCISION OF BREAST LUMPECTOMY Right 12/22/2019   Procedure: RIGHT BREAST RE-EXCISION OF BREAST LUMPECTOMY;  Surgeon: Stark Klein, MD;  Location: [place];  Service: General;  Laterality: Right;  . WISDOM TOOTH EXTRACTION      There were no vitals filed for this visit.   Subjective Assessment - 07/26/20 1503    Subjective I have done the MLD around 3 days and I think it is going well. Swelling is not as bad.  It is  definitely decreased but may have a little lateral swelling.  Have occasional right breast tenderness.  Swelling and tenderness is 40-50% better.  I can sleep on right side now. no other limitations I am aware of.    Pertinent History Pt was diagnosed in 2019 with CA and underwent a Left Lumpectomy .  On 11/18/19 she had a Rigght Lumpectomy  followed by a re-excision on 12/22/19.  Her radiation ended on 02/18/20.  She had no LN's removed She is presently on Tamoxifen. Cancer was stage ) on both occasions    Patient Stated Goals To improve shoulder tightness/ROM. Be able to sleep better    Currently in Pain? No/denies    Pain Score 0-No pain              [facility] PT Assessment - 07/26/20 0001      AROM   Right Shoulder Flexion 167 Degrees    Right Shoulder ABduction 179 Degrees                         [facility] Adult PT Treatment/Exercise - 07/26/20 0001      Shoulder Exercises: P[place]   Flexion 2 minutes    ABduction 2 minutes                       PT Long Term Goals - 07/26/20 1507  PT LONG TERM GOAL #1   Title Pt will be independent in a HEP for right shoulder ROM and strengthening    Time 4    Period Weeks    Status Achieved      PT LONG TERM GOAL #2   Title Pt will have decreased right breast tenderness/swelling by atleast 50%    Time 4    Period Weeks    Status Achieved      PT LONG TERM GOAL #3   Title Pt will be able to sleep on her right side with with minimal complaints    Time 6    Period Weeks    Status Achieved      PT LONG TERM GOAL #4   Title Pt will have right shoulder ABduction 165 degrees for improved reaching ability    Time 6    Status Achieved      PT LONG TERM GOAL #5   Title Pt will be independent with breast MLD prn    Time 6    Period Weeks    Status On-going                 Plan - 07/26/20 1549    Clinical Impression Statement Pt has achieved all goals established except independence in MLD.  Treatment  today included assessing shoulder ROM, and checking all goals.  Most time spent reviewing and instructing pt. in self MLD.  Pt required extensive cueing today for technique and sequence. She did not have her chip pack in and upper breast was slightly firmer today.  I will see her back one more time and encouraged her to look carefully at the handout to be sure she is doing in the proper sequence    Examination-Activity Limitations Reach Overhead;Sleep    Stability/Clinical Decision Making Stable/Uncomplicated    Rehab Potential Excellent    PT Frequency 2x / week    PT Duration 6 weeks    PT Treatment/Interventions ADLs/Self Care Home Management;Therapeutic activities;Therapeutic exercise;Neuromuscular re-education;Manual techniques;Patient/family education;Manual lymph drainage;Passive range of motion;Scar mobilization    PT Next Visit Plan DC? review self MLD, review exs prn, check breast swelling    PT Home Exercise Plan 4 post op exercises, supine wand flex and scaption, supine scapular series x 10 with yellow TB, self MLD    Consulted and Agree with Plan of Care Patient           Patient will benefit from skilled therapeutic intervention in order to improve the following deficits and impairments:  Decreased range of motion,Increased fascial restricitons,Impaired UE functional use,Decreased knowledge of precautions,Pain,Impaired flexibility,Decreased strength,Increased edema,Postural dysfunction  Visit Diagnosis: Chronic right shoulder pain  Aftercare following surgery for neoplasm  Ductal carcinoma in situ (DCIS) of breast, unspecified laterality  Abnormal posture     Problem List Patient Active Problem List   Diagnosis Date Noted  . 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

## 2020-08-07 ENCOUNTER — Ambulatory Visit: Payer: Medicare Other | Attending: Nurse Practitioner

## 2020-08-07 ENCOUNTER — Other Ambulatory Visit: Payer: Self-pay

## 2020-08-07 DIAGNOSIS — D0510 Intraductal carcinoma in situ of unspecified breast: Secondary | ICD-10-CM | POA: Insufficient documentation

## 2020-08-07 DIAGNOSIS — M25511 Pain in right shoulder: Secondary | ICD-10-CM | POA: Insufficient documentation

## 2020-08-07 DIAGNOSIS — Z483 Aftercare following surgery for neoplasm: Secondary | ICD-10-CM | POA: Diagnosis not present

## 2020-08-07 DIAGNOSIS — R293 Abnormal posture: Secondary | ICD-10-CM | POA: Insufficient documentation

## 2020-08-07 DIAGNOSIS — G8929 Other chronic pain: Secondary | ICD-10-CM | POA: Diagnosis not present

## 2020-08-07 DIAGNOSIS — R600 Localized edema: Secondary | ICD-10-CM

## 2020-08-07 NOTE — Therapy (Signed)
[place], [place], [address] Phone: [phone]   Fax:  [phone]  Physical Therapy Treatment  Patient Details  Name: Victoria Fuller MRN: [id number] Date of Birth: Nov 16, 1950 Referring Provider (PT): Cira Rue PA   Encounter Date: 08/07/2020   PT End of Session - 08/07/20 1123    Visit Number 6    Number of Visits 12    Date for PT Re-Evaluation 09/18/20    PT Start Time 1109    PT Stop Time 1148    PT Time Calculation (min) 39 min    Activity Tolerance Patient tolerated treatment well    Behavior During Therapy [facility] for tasks assessed/performed           Past Medical History:  Diagnosis Date  . Cancer ([place]) 08/2017   Left breast cancer  . Hyperlipidemia   . Hypertension   . Wears contact lenses     Past Surgical History:  Procedure Laterality Date  . ABDOMINAL HYSTERECTOMY    . BREAST LUMPECTOMY Left 2019  . BREAST LUMPECTOMY WITH RADIOACTIVE SEED LOCALIZATION Left 09/10/2017   Procedure: BREAST LUMPECTOMY WITH RADIOACTIVE SEED LOCALIZATION;  Surgeon: Stark Klein, MD;  Location: [place];  Service: General;  Laterality: Left;  . BREAST LUMPECTOMY WITH RADIOACTIVE SEED LOCALIZATION Right 11/18/2019   Procedure: RIGHT BREAST LUMPECTOMY WITH RADIOACTIVE SEED LOCALIZATION;  Surgeon: Stark Klein, MD;  Location: [place];  Service: General;  Laterality: Right;  . COLONOSCOPY W/ BIOPSIES AND POLYPECTOMY    . DIAGNOSTIC LAPAROSCOPY     uterine polyps  . RE-EXCISION OF BREAST LUMPECTOMY Right 12/22/2019   Procedure: RIGHT BREAST RE-EXCISION OF BREAST LUMPECTOMY;  Surgeon: Stark Klein, MD;  Location: [place];  Service: General;  Laterality: Right;  . WISDOM TOOTH EXTRACTION      There were no vitals filed for this visit.   Subjective Assessment - 08/07/20 1111    Subjective I am doing the lymph drainage and the chip pack.  I think the chip pack helps the most. In the  morning when I get up it is still firm, but the swelling above breast seems better. I have not been doing scar mobs. Tenderness at the breast is better, but still occurs every once in a while    Pertinent History Pt was diagnosed in 2019 with CA and underwent a Left Lumpectomy .  On 11/18/19 she had a Right Lumpectomy  followed by a re-excision on 12/22/19.  Her radiation ended on 02/18/20.  She had no LN's removed She is presently on Tamoxifen. Cancer was stage ) on both occasions                                          PT Long Term Goals - 08/07/20 1134      PT LONG TERM GOAL #1   Title Pt will be independent in a HEP for right shoulder ROM and strengthening    Time 4    Period Weeks    Status Achieved      PT LONG TERM GOAL #2   Title Pt will have decreased right breast tenderness/swelling by atleast 50%    Baseline tenderness better by 80%, swelling comes and goes    Period Weeks    Status Partially Met      PT LONG TERM GOAL #3   Title Pt will be  able to sleep on her right side with with minimal complaints    Time 6    Period Weeks    Status Achieved      PT LONG TERM GOAL #4   Title Pt will have right shoulder ABduction 165 degrees for improved reaching ability    Period Weeks    Status Achieved      PT LONG TERM GOAL #5   Title Pt will be independent with breast MLD prn    Time 6    Period Weeks    Status Achieved                 Plan - 08/07/20 1153    Clinical Impression Statement Pt performed all right breast MLD with only an occasional VC required and greatly improved technique.  Breast tenderness is 80% better but swelling although improved with chip pack, returns again without it despite her efforts with MLD.  We discussed the necessity of a compression bra and she will get a referral to [facility] signed next week when she sees Dr. Barry Dienes.  A new chip pack was made for pt today.  She will return for 1 more visit after  she receives her compressin bra. She has achieved all goals except she has partially achieved breast tenderness/swelling goal secondary to continued swelling if she goes without chip pack.    Personal Factors and Comorbidities Comorbidity 2    Comorbidities Right breast CA, radiation    Examination-Activity Limitations Reach Overhead;Sleep    Examination-Participation Restrictions Cleaning    Stability/Clinical Decision Making Stable/Uncomplicated    Clinical Decision Making Low    Rehab Potential Excellent    PT Frequency 1x / week    PT Duration 6 weeks    PT Treatment/Interventions ADLs/Self Care Home Management;Therapeutic activities;Therapeutic exercise;Neuromuscular re-education;Manual techniques;Patient/family education;Manual lymph drainage;Passive range of motion;Scar mobilization    PT Next Visit Plan check compression bra, review MLD prn, DC    PT Home Exercise Plan 4 post op exercises, supine wand flex and scaption, supine scapular series x 10 with yellow TB, self MLD    Recommended Other Services being fit for compression bra at [facility] and Agree with Plan of Care Patient           Patient will benefit from skilled therapeutic intervention in order to improve the following deficits and impairments:  Decreased range of motion,Increased fascial restricitons,Impaired UE functional use,Decreased knowledge of precautions,Pain,Impaired flexibility,Decreased strength,Increased edema,Postural dysfunction  Visit Diagnosis: Chronic right shoulder pain  Aftercare following surgery for neoplasm  Ductal carcinoma in situ (DCIS) of breast, unspecified laterality  Abnormal posture  Localized edema     Problem List Patient Active Problem List   Diagnosis Date Noted  . 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

## 2020-08-14 DIAGNOSIS — Z170 Estrogen receptor positive status [ER+]: Secondary | ICD-10-CM | POA: Diagnosis not present

## 2020-08-14 DIAGNOSIS — C50411 Malignant neoplasm of upper-outer quadrant of right female breast: Secondary | ICD-10-CM | POA: Diagnosis not present

## 2020-08-14 DIAGNOSIS — C50412 Malignant neoplasm of upper-outer quadrant of left female breast: Secondary | ICD-10-CM | POA: Diagnosis not present

## 2020-08-17 DIAGNOSIS — H52223 Regular astigmatism, bilateral: Secondary | ICD-10-CM | POA: Diagnosis not present

## 2020-08-17 DIAGNOSIS — H25813 Combined forms of age-related cataract, bilateral: Secondary | ICD-10-CM | POA: Diagnosis not present

## 2020-08-17 DIAGNOSIS — H5213 Myopia, bilateral: Secondary | ICD-10-CM | POA: Diagnosis not present

## 2020-08-17 DIAGNOSIS — H524 Presbyopia: Secondary | ICD-10-CM | POA: Diagnosis not present

## 2020-08-20 ENCOUNTER — Other Ambulatory Visit: Payer: Self-pay | Admitting: Hematology

## 2020-08-20 DIAGNOSIS — D0512 Intraductal carcinoma in situ of left breast: Secondary | ICD-10-CM

## 2020-08-24 DIAGNOSIS — C50912 Malignant neoplasm of unspecified site of left female breast: Secondary | ICD-10-CM | POA: Diagnosis not present

## 2020-08-24 DIAGNOSIS — C50411 Malignant neoplasm of upper-outer quadrant of right female breast: Secondary | ICD-10-CM | POA: Diagnosis not present

## 2020-09-19 ENCOUNTER — Ambulatory Visit: Payer: Medicare Other | Attending: Nurse Practitioner

## 2020-09-19 ENCOUNTER — Other Ambulatory Visit: Payer: Self-pay

## 2020-09-19 DIAGNOSIS — D0510 Intraductal carcinoma in situ of unspecified breast: Secondary | ICD-10-CM

## 2020-09-19 DIAGNOSIS — G8929 Other chronic pain: Secondary | ICD-10-CM

## 2020-09-19 DIAGNOSIS — M25511 Pain in right shoulder: Secondary | ICD-10-CM | POA: Diagnosis not present

## 2020-09-19 DIAGNOSIS — R293 Abnormal posture: Secondary | ICD-10-CM

## 2020-09-19 DIAGNOSIS — Z483 Aftercare following surgery for neoplasm: Secondary | ICD-10-CM | POA: Diagnosis not present

## 2020-09-19 DIAGNOSIS — R600 Localized edema: Secondary | ICD-10-CM | POA: Insufficient documentation

## 2020-09-19 NOTE — Therapy (Signed)
[place], [place], [address] Phone: [phone]   Fax:  [phone]  Physical Therapy Treatment  Patient Details  Name: Victoria Fuller MRN: [id number] Date of Birth: 15-Apr-1951 Referring Provider (PT): Cira Rue PA   Encounter Date: 09/19/2020   PT End of Session - 09/19/20 1152    Visit Number 7    Number of Visits 12    Date for PT Re-Evaluation 09/19/20    PT Start Time 1106    PT Stop Time 1145    PT Time Calculation (min) 39 min    Activity Tolerance Patient tolerated treatment well    Behavior During Therapy [facility] for tasks assessed/performed           Past Medical History:  Diagnosis Date  . Cancer ([place]) 08/2017   Left breast cancer  . Hyperlipidemia   . Hypertension   . Wears contact lenses     Past Surgical History:  Procedure Laterality Date  . ABDOMINAL HYSTERECTOMY    . BREAST LUMPECTOMY Left 2019  . BREAST LUMPECTOMY WITH RADIOACTIVE SEED LOCALIZATION Left 09/10/2017   Procedure: BREAST LUMPECTOMY WITH RADIOACTIVE SEED LOCALIZATION;  Surgeon: Stark Klein, MD;  Location: [place];  Service: General;  Laterality: Left;  . BREAST LUMPECTOMY WITH RADIOACTIVE SEED LOCALIZATION Right 11/18/2019   Procedure: RIGHT BREAST LUMPECTOMY WITH RADIOACTIVE SEED LOCALIZATION;  Surgeon: Stark Klein, MD;  Location: [place];  Service: General;  Laterality: Right;  . COLONOSCOPY W/ BIOPSIES AND POLYPECTOMY    . DIAGNOSTIC LAPAROSCOPY     uterine polyps  . RE-EXCISION OF BREAST LUMPECTOMY Right 12/22/2019   Procedure: RIGHT BREAST RE-EXCISION OF BREAST LUMPECTOMY;  Surgeon: Stark Klein, MD;  Location: [place];  Service: General;  Laterality: Right;  . WISDOM TOOTH EXTRACTION      There were no vitals filed for this visit.   Subjective Assessment - 09/19/20 1107    Subjective Have been wearing my compression bra and it is comfortable and it has helped with the swelling.  I  havent done the MLD consistently. I may do it one time a week. I feel like the swelling is gone. I do feel some firmness laterally. Do have some discomfort with pressure applied to an area of the right breast. I did wear the chip pack with the bra initially.    Pertinent History Pt was diagnosed in 2019 with CA and underwent a Left Lumpectomy .  On 11/18/19 she had a Rigght Lumpectomy  followed by a re-excision on 12/22/19.  Her radiation ended on 02/18/20.  She had no LN's removed She is presently on Tamoxifen. Cancer was stage ) on both occasions    Patient Stated Goals To improve shoulder tightness/ROM. Be able to sleep better    Currently in Pain? No/denies    Pain Score 0-No pain              [facility] PT Assessment - 09/19/20 0001      Assessment   Medical Diagnosis S/p Right Breast CA    Referring Provider (PT) Cira Rue PA    Onset Date/Surgical Date 12/22/19      AROM   Right Shoulder Flexion 165 Degrees    Right Shoulder ABduction 176 Degrees                         [facility] Adult PT Treatment/Exercise - 09/19/20 0001      Manual Therapy   Edema  Management Pt reminded to use chip pack prn to soften right upper breast prn and to wear compression bra    Manual Lymphatic Drainage (MLD) Pt performed all right breast MLD;short neck, 5 breaths, Left axillary LN's right inguinal LN's, anterior interaxillary pathway,Right axillo-inguinal pathway, and right breast retracing all steps. she required occasional VCs but did very well overall                       PT Long Term Goals - 09/19/20 1137      PT LONG TERM GOAL #1   Title Pt will be independent in a HEP for right shoulder ROM and strengthening    Time 4    Period Weeks    Status Achieved      PT LONG TERM GOAL #2   Title Pt will have decreased right breast tenderness/swelling by atleast 50%    Baseline tenderness better by 80%, swelling comes and goes    Period Weeks    Status Partially Met       PT LONG TERM GOAL #3   Title Pt will be able to sleep on her right side with with minimal complaints    Time 6    Period Weeks    Status Achieved     PT LONG TERM GOAL #4   Title Pt will have right shoulder ABduction 165 degrees for improved reaching ability    Time 6    Period Weeks    Status Achieved      PT LONG TERM GOAL #5   Title Pt will be independent with breast MLD prn    Time 6    Period Weeks    Status Achieved                 Plan - 09/19/20 1153    Clinical Impression Statement Pt purchased her compression bra and has been compliant with wearing, however she has not been as compliant with MLD .  she does have mild swelling and tenderness at the right breast proximal to the nipple.  We did review MLD today and pt did quite well but needed a few cues for proper breast technique.  She has achieved all goals except for some mild breast swelling.  She was educated on importance of continuing MLD if not daily then every other day so she keeps the breast decongested and she remembers her technique.  She verbalized understanding.    Personal Factors and Comorbidities Comorbidity 2    Comorbidities Right breast CA, radiation    Examination-Activity Limitations Reach Overhead;Sleep    Examination-Participation Restrictions Cleaning    Stability/Clinical Decision Making Stable/Uncomplicated    Rehab Potential Excellent    PT Frequency 1x / week    PT Duration 6 weeks    PT Treatment/Interventions ADLs/Self Care Home Management;Therapeutic activities;Therapeutic exercise;Neuromuscular re-education;Manual techniques;Patient/family education;Manual lymph drainage;Passive range of motion;Scar mobilization    PT Next Visit Plan DC to HEP    PT Home Exercise Plan 4 post op exercises, supine wand flex and scaption, supine scapular series x 10 with yellow TB, self MLD    Consulted and Agree with Plan of Care Patient           Patient will benefit from skilled therapeutic  intervention in order to improve the following deficits and impairments:  Decreased range of motion,Increased fascial restricitons,Impaired UE functional use,Decreased knowledge of precautions,Pain,Impaired flexibility,Decreased strength,Increased edema,Postural dysfunction  Visit Diagnosis: Chronic right shoulder pain  Aftercare following  surgery for neoplasm  Ductal carcinoma in situ (DCIS) of breast, unspecified laterality  Abnormal posture  Localized edema     Problem List Patient Active Problem List   Diagnosis Date Noted  . 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  Patient goals were met. Patient is being discharged due to meeting the stated rehab goals.  ?????       Claris Pong 09/19/2020, 12:00 PM  [place], [place], [address] Phone: [phone]   Fax:  [phone]  Name: Victoria Fuller MRN: [id number] Date of Birth: 11-07-1950 Cheral Almas, PT 09/19/20 12:03 PM

## 2020-09-25 ENCOUNTER — Other Ambulatory Visit: Payer: Self-pay | Admitting: Hematology

## 2020-09-25 DIAGNOSIS — D0512 Intraductal carcinoma in situ of left breast: Secondary | ICD-10-CM

## 2020-10-02 ENCOUNTER — Ambulatory Visit
Admission: RE | Admit: 2020-10-02 | Discharge: 2020-10-02 | Disposition: A | Discharge status: Home / Self Care | Payer: Medicare Other | Source: Ambulatory Visit | Attending: Nurse Practitioner | Admitting: Nurse Practitioner

## 2020-10-02 ENCOUNTER — Other Ambulatory Visit: Payer: Self-pay

## 2020-10-02 DIAGNOSIS — R922 Inconclusive mammogram: Secondary | ICD-10-CM | POA: Diagnosis not present

## 2020-10-02 DIAGNOSIS — Z853 Personal history of malignant neoplasm of breast: Secondary | ICD-10-CM | POA: Diagnosis not present

## 2020-10-02 DIAGNOSIS — D0511 Intraductal carcinoma in situ of right breast: Secondary | ICD-10-CM

## 2020-10-02 HISTORY — DX: Personal history of irradiation: Z92.3

## 2020-10-11 ENCOUNTER — Inpatient Hospital Stay: Payer: Medicare Other | Admitting: Hematology

## 2020-10-11 ENCOUNTER — Inpatient Hospital Stay: Payer: Medicare Other

## 2020-10-12 ENCOUNTER — Telehealth: Payer: Self-pay | Admitting: Hematology

## 2020-10-12 NOTE — Telephone Encounter (Signed)
Scheduled  appointment per 06/16 sch msg. Patient is aware. 

## 2020-10-19 ENCOUNTER — Other Ambulatory Visit: Payer: Self-pay

## 2020-10-19 ENCOUNTER — Inpatient Hospital Stay: Payer: Medicare Other | Attending: Hematology

## 2020-10-19 ENCOUNTER — Encounter: Payer: Self-pay | Admitting: Hematology

## 2020-10-19 ENCOUNTER — Inpatient Hospital Stay: Payer: Medicare Other | Admitting: Hematology

## 2020-10-19 VITALS — BP 144/81 | HR 83 | Temp 97.8°F | Resp 18 | Ht 69.0 in | Wt 185.2 lb

## 2020-10-19 DIAGNOSIS — I10 Essential (primary) hypertension: Secondary | ICD-10-CM | POA: Insufficient documentation

## 2020-10-19 DIAGNOSIS — Z853 Personal history of malignant neoplasm of breast: Secondary | ICD-10-CM | POA: Insufficient documentation

## 2020-10-19 DIAGNOSIS — R635 Abnormal weight gain: Secondary | ICD-10-CM | POA: Insufficient documentation

## 2020-10-19 DIAGNOSIS — D0511 Intraductal carcinoma in situ of right breast: Secondary | ICD-10-CM | POA: Insufficient documentation

## 2020-10-19 DIAGNOSIS — D0512 Intraductal carcinoma in situ of left breast: Secondary | ICD-10-CM

## 2020-10-19 DIAGNOSIS — R21 Rash and other nonspecific skin eruption: Secondary | ICD-10-CM | POA: Diagnosis not present

## 2020-10-19 DIAGNOSIS — Z923 Personal history of irradiation: Secondary | ICD-10-CM | POA: Insufficient documentation

## 2020-10-19 DIAGNOSIS — Z79899 Other long term (current) drug therapy: Secondary | ICD-10-CM | POA: Diagnosis not present

## 2020-10-19 DIAGNOSIS — R232 Flushing: Secondary | ICD-10-CM | POA: Insufficient documentation

## 2020-10-19 DIAGNOSIS — E785 Hyperlipidemia, unspecified: Secondary | ICD-10-CM | POA: Diagnosis not present

## 2020-10-19 DIAGNOSIS — Z170 Estrogen receptor positive status [ER+]: Secondary | ICD-10-CM | POA: Insufficient documentation

## 2020-10-19 LAB — CMP (CANCER CENTER ONLY)
ALT: 22 U/L (ref 0–44)
AST: 19 U/L (ref 15–41)
Albumin: 3.9 g/dL (ref 3.5–5.0)
Alkaline Phosphatase: 46 U/L (ref 38–126)
Anion gap: 11 (ref 5–15)
BUN: 8 mg/dL (ref 8–23)
CO2: 27 mmol/L (ref 22–32)
Calcium: 9.2 mg/dL (ref 8.9–10.3)
Chloride: 104 mmol/L (ref 98–111)
Creatinine: 0.69 mg/dL (ref 0.44–1.00)
GFR, Estimated: >60 mL/min (ref >60)
Glucose, Bld: 106 mg/dL — ABNORMAL HIGH (ref 70–99)
Potassium: 3.3 mmol/L — ABNORMAL LOW (ref 3.5–5.1)
Sodium: 142 mmol/L (ref 135–145)
Total Bilirubin: 1.2 mg/dL (ref 0.3–1.2)
Total Protein: 6.6 g/dL (ref 6.5–8.1)

## 2020-10-19 LAB — CBC WITH DIFFERENTIAL (CANCER CENTER ONLY)
Abs Immature Granulocytes: 0.01 10*3/uL (ref 0.00–0.07)
Basophils Absolute: 0 10*3/uL (ref 0.0–0.1)
Basophils Relative: 1 %
Eosinophils Absolute: 0.1 10*3/uL (ref 0.0–0.5)
Eosinophils Relative: 2 %
HCT: 37.1 % (ref 36.0–46.0)
Hemoglobin: 12.7 g/dL (ref 12.0–15.0)
Immature Granulocytes: 0 %
Lymphocytes Relative: 24 %
Lymphs Abs: 0.8 10*3/uL (ref 0.7–4.0)
MCH: 30.2 pg (ref 26.0–34.0)
MCHC: 34.2 g/dL (ref 30.0–36.0)
MCV: 88.1 fL (ref 80.0–100.0)
Monocytes Absolute: 0.4 10*3/uL (ref 0.1–1.0)
Monocytes Relative: 12 %
Neutro Abs: 1.9 10*3/uL (ref 1.7–7.7)
Neutrophils Relative %: 61 %
Platelet Count: 143 10*3/uL — ABNORMAL LOW (ref 150–400)
RBC: 4.21 MIL/uL (ref 3.87–5.11)
RDW: 12.4 % (ref 11.5–15.5)
WBC Count: 3.1 10*3/uL — ABNORMAL LOW (ref 4.0–10.5)
nRBC: 0 % (ref 0.0–0.2)

## 2020-10-19 MED ORDER — TAMOXIFEN CITRATE 20 MG PO TABS: 20.0000 mg | ORAL_TABLET | Freq: Every day | ORAL | 5 refills | Status: DC

## 2020-10-19 NOTE — Progress Notes (Signed)
[place]   [phone] [phone]   Clinic Follow up Note   Patient Care Team: Donald Prose, MD as PCP - General (Family Medicine) Stark Klein, MD as Consulting Physician (General Surgery) Truitt Merle, MD as Consulting Physician (Hematology) Kyung Rudd, MD as Consulting Physician (Radiation Oncology) Delice Bison, Charlestine Massed, NP as Nurse Practitioner (Hematology and Oncology) Alla Feeling, NP as Nurse Practitioner (Nurse Practitioner)  Date of Service:  10/19/2020  CHIEF COMPLAINT: f/u of right breast DCIS and h/o left breast DCIS  SUMMARY OF ONCOLOGIC HISTORY: Oncology History Overview Note  Cancer Staging Ductal carcinoma in situ (DCIS) of left breast Staging form: Breast, AJCC 8th Edition - Clinical stage from 08/25/2017: Stage 0 (cTis (DCIS), cN0, cM0, ER: Unknown, PR: Unknown, HER2: Not Assessed) - Signed by Truitt Merle, MD on 09/03/2017 Nuclear grade: G2 Laterality: Left - Pathologic: Stage 0 (pTis (DCIS), pN0, cM0, ER+, PR+) - Unsigned  Ductal carcinoma in situ (DCIS) of right breast Staging form: Breast, AJCC 8th Edition - Clinical stage from 10/06/2019: Stage 0 (cTis (DCIS), cN0, cM0, ER+, PR+, HER2: Not Assessed) - Signed by Alla Feeling, NP on 06/12/2020 Stage prefix: Initial diagnosis Nuclear grade: G2 - Pathologic stage from 06/12/2020: Stage 0 (pTis (DCIS), pN0, cM0, ER+, PR+, HER2: Not Assessed) - Signed by Alla Feeling, NP on 06/12/2020 Nuclear grade: G2     Ductal carcinoma in situ (DCIS) of left breast  08/21/2017 Mammogram   IMPRESSION: New grouped pleomorphic calcifications within the upper-outer quadrant of the LEFT breast, spanning 4 mm extent. This is a suspicious finding for which stereotactic biopsy is recommended.    08/25/2017 Initial Biopsy   Diagnosis 08/25/17 Breast, left, needle core biopsy, upper outer quadrant coil clip - DUCTAL CARCINOMA IN SITU WITH CALCIFICATIONS, INTERMEDIATE GRADE. - DUCTAL  PAPILLOMA. - SEE MICROSCOPIC DESCRIPTION.    08/25/2017 Cancer Staging   Staging form: Breast, AJCC 8th Edition - Clinical stage from 08/25/2017: Stage 0 (cTis (DCIS), cN0, cM0, ER: Unknown, PR: Unknown, HER2: Not Assessed) - Signed by Truitt Merle, MD on 09/03/2017    08/29/2017 Initial Diagnosis   Ductal carcinoma in situ (DCIS) of left breast    09/10/2017 Surgery   left BREAST LUMPECTOMY WITH RADIOACTIVE SEED LOCALIZATION by Stark Klein, MD    09/10/2017 Pathology Results   09/10/2017 Surgical Pathology Diagnosis Breast, lumpectomy - DUCTAL CARCINOMA IN SITU, INTERMEDIATE NUCLEAR GRADE, WITH CALCIFICATIONS - MARGINS UNINVOLVED BY CARCINOMA - DUCT ECTASIA AND PERIDUCTULAR CHRONIC INFLAMMATION - FIBROADENOMATOID NODULE - PREVIOUS BIOPSY SITE CHANGES - SEE ONCOLOGY TABLE BELOW      10/08/2017 - 11/06/2017 Radiation Therapy   The patient initially received a dose of 42.56 Gy in 16 fractions to the breast using whole-breast tangent fields. This was delivered using a 3-D conformal technique. The patient then received a boost to the seroma. This delivered an additional 8 Gy in 4 fractions using a 3 field photon technique due to the depth of the seroma. The total dose was 50.56 Gy.     Anti-estrogen oral therapy   She declined anti-estrogen therapy due to concerns with side effects.    Ductal carcinoma in situ (DCIS) of right breast  09/29/2019 Mammogram   Diagnostic Mammogram on 09/29/19  IMPRESSION: Suspicious calcifications spanning 85m in the 12 o'clock region of the right breast.   10/06/2019 Initial Biopsy   Diagnosis Breast, right, needle core biopsy, upper central, coil clip - DUCTAL CARCINOMA IN SITU WITH CALCIFICATIONS, INTERMEDIATE NUCLEAR GRADE. - SEE MICROSCOPIC  DESCRIPTION. Microscopic Comment Estrogen and progesterone receptors will be performed. PROGNOSTIC INDICATOR RESULTS: Immunohistochemical and morphometric analysis performed manually Estrogen Receptor: 100%,  STRONG STAINING INTESITY Progesterone Receptor: 80%, STRONG STAINING INTESITY   10/06/2019 Cancer Staging   Staging form: Breast, AJCC 8th Edition - Clinical stage from 10/06/2019: Stage 0 (cTis (DCIS), cN0, cM0, ER+, PR+, HER2: Not Assessed) - Signed by Alla Feeling, NP on 06/12/2020  Stage prefix: Initial diagnosis  Nuclear grade: G2    10/13/2019 Initial Diagnosis   Ductal carcinoma in situ (DCIS) of right breast   11/18/2019 Surgery   RIGHT BREAST LUMPECTOMY WITH RADIOACTIVE SEED LOCALIZATION with Dr Barry Dienes    11/18/2019 Pathology Results   FINAL MICROSCOPIC DIAGNOSIS:   A. BREAST, RIGHT, LUMPECTOMY:  - Ductal carcinoma in situ with calcifications, intermediate grade with  focal necrosis, 1.3 cm  - No evidence of invasive carcinoma  - DCIS focally involves the lateral margin and is 0.3 cm from posterior  margin  - Atypical ductal hyperplasia, see comment  - Biopsy site changes  - See oncology table    COMMENT:   The small focus of ADH has a dissimilar appearance from rest of the DCIS  and is about 1 mm from the posterior margin.    12/22/2019 Surgery   RIGHT BREAST RE-EXCISION OF BREAST LUMPECTOMY with Dr Barry Dienes   12/22/2019 Pathology Results   FINAL MICROSCOPIC DIAGNOSIS:   A. BREAST, RIGHT, LATERAL MARGIN, RE-EXCISION:  - Resection site changes.  - Fibrocystic change.  - No malignancy identified.    01/24/2020 - 02/18/2020 Radiation Therapy   Adjuvant Radiation with Dr Lisbeth Renshaw    02/2020 -  Anti-estrogen oral therapy   Tamoxifen 83m starting 02/2020, will start at 133mand if tolerable will increase 201m   06/12/2020 Survivorship   SCP reviewed virtually by LacCira RueP. To be mailed to patient after visit    06/12/2020 Cancer Staging   Staging form: Breast, AJCC 8th Edition - Pathologic stage from 06/12/2020: Stage 0 (pTis (DCIS), pN0, cM0, ER+, PR+, HER2: Not Assessed) - Signed by BurAlla FeelingP on 06/12/2020  Nuclear grade: G2       CURRENT  THERAPY:  Tamoxifen 5m10marting 02/2020, will start at 10mg26m if tolerable will increase 5mg.30mTERVAL HISTORY:  [name] for a follow up of DCIS. She was last seen by me on 04/12/20 and by NP Lacie on 06/12/20 in the survivorship clinic. She presents to the clinic alone. She notes some new spots on her legs that look like bruises. She believes these began since starting tamoxifen. She notes her hot flashes have diminished.  All other systems were reviewed with the patient and are negative.  MEDICAL HISTORY:  Past Medical History:  Diagnosis Date   Cancer ([facility]) [id number]   Left breast cancer   Hyperlipidemia    Hypertension    Personal history of radiation therapy    left and right breasts   Wears contact lenses     SURGICAL HISTORY: Past Surgical History:  Procedure Laterality Date   ABDOMINAL HYSTERECTOMY     BREAST BIOPSY Left 08/25/2017   BREAST BIOPSY Right 10/06/2019   BREAST LUMPECTOMY Left 08/31/2017   BREAST LUMPECTOMY Right 11/18/2019   BREAST LUMPECTOMY WITH RADIOACTIVE SEED LOCALIZATION Left 09/10/2017   Procedure: BREAST LUMPECTOMY WITH RADIOACTIVE SEED LOCALIZATION;  Surgeon: ByerlyStark Klein [place] [facility] [place]: General;  Laterality: Left;   BREAST LUMPECTOMY WITH RADIOACTIVE SEED LOCALIZATION  Right 11/18/2019   Procedure: RIGHT BREAST LUMPECTOMY WITH RADIOACTIVE SEED LOCALIZATION;  Surgeon: Stark Klein, MD;  Location: [facility];  Service: General;  Laterality: Right;   COLONOSCOPY W/ BIOPSIES AND POLYPECTOMY     DIAGNOSTIC LAPAROSCOPY     uterine polyps   RE-EXCISION OF BREAST LUMPECTOMY Right 12/22/2019   Procedure: RIGHT BREAST RE-EXCISION OF BREAST LUMPECTOMY;  Surgeon: Stark Klein, MD;  Location: [facility];  Service: General;  Laterality: Right;   WISDOM TOOTH EXTRACTION      I have reviewed the social history and family history with the patient and they are unchanged from previous note.  ALLERGIES:  has No Known  Allergies.  MEDICATIONS:  Current Outpatient Medications  Medication Sig Dispense Refill   tamoxifen (NOLVADEX) 20 MG tablet Take 1 tablet (20 mg total) by mouth daily. 30 tablet 5   amLODipine (NORVASC) 5 MG tablet Take 5 mg by mouth daily.     aspirin EC 81 MG tablet Take 81 mg by mouth 3 (three) times a week. Swallow whole.     atorvastatin (LIPITOR) 10 MG tablet Take 10 mg by mouth every other day.      hydrochlorothiazide (MICROZIDE) 12.5 MG capsule Take 12.5 mg by mouth daily.     Multiple Vitamin (MULTIVITAMIN WITH MINERALS) TABS tablet Take 1 tablet by mouth daily.     No current facility-administered medications for this visit.    PHYSICAL EXAMINATION: ECOG PERFORMANCE STATUS: 0 - Asymptomatic  Vitals:   10/19/20 1143  BP: (!) 144/81  Pulse: 83  Resp: 18  Temp: 97.8 F (36.6 C)  SpO2: 99%   Filed Weights   10/19/20 1143  Weight: 185 lb 3.2 oz (84 kg)    GENERAL:alert, no distress and comfortable SKIN: skin color, texture, turgor are normal, no rashes or significant lesions EYES: normal, Conjunctiva are pink and non-injected, sclera clear  NECK: supple, thyroid normal size, non-tender, without nodularity LYMPH:  no palpable lymphadenopathy in the cervical, axillary  ABDOMEN:abdomen soft, non-tender and normal bowel sounds Musculoskeletal:no cyanosis of digits and no clubbing  NEURO: alert & oriented x 3 with fluent speech, no focal motor/sensory deficits BREASTS: scar tissue noted in left breast; no palpable concerns  LABORATORY DATA:  I have reviewed the data as listed CBC Latest Ref Rng & Units 10/19/2020 06/12/2020 12/22/2019  WBC 4.0 - 10.5 K/uL 3.1(L) 2.9(L) 3.1(L)  Hemoglobin 12.0 - 15.0 g/dL 12.7 13.2 13.0  Hematocrit 36.0 - 46.0 % 37.1 38.6 39.5  Platelets 150 - 400 K/uL 143(L) 160 148(L)     CMP Latest Ref Rng & Units 10/19/2020 06/12/2020 12/22/2019  Glucose 70 - 99 mg/dL 106(H) 113(H) 94  BUN 8 - 23 mg/dL '8 9 9  ' Creatinine 0.44 - 1.00 mg/dL 0.69  0.78 0.71  Sodium 135 - 145 mmol/L 142 140 141  Potassium 3.5 - 5.1 mmol/L 3.3(L) 3.4(L) 3.0(L)  Chloride 98 - 111 mmol/L 104 104 104  CO2 22 - 32 mmol/L '27 29 27  ' Calcium 8.9 - 10.3 mg/dL 9.2 9.5 9.6  Total Protein 6.5 - 8.1 g/dL 6.6 6.9 6.8  Total Bilirubin 0.3 - 1.2 mg/dL 1.2 1.3(H) 1.8(H)  Alkaline Phos 38 - 126 U/L 46 51 64  AST 15 - 41 U/L 19 14(L) 26  ALT 0 - 44 U/L 22 18 36      RADIOGRAPHIC STUDIES: I have personally reviewed the radiological images as listed and agreed with the findings in the report. No results found.   ASSESSMENT & PLAN:  Victoria Fuller is a 70 y.o. female with   1. Ductal Carcinoma in situ (DCIS) of Right breast, ER/PR+, Intermediate grade -She was diagnosed in 09/2019. She is s/p right breast lumpectomy with Dr Barry Dienes on 11/18/19. Given her lateral positive margin she underwent right breast re-excision surgery on 12/22/19 with Dr Barry Dienes and no further malignancy was found.  -She recently completed adjuvant radiation with Dr Lisbeth Renshaw on 01/24/20-02/18/20.  -She previously declined genetic testing.  -Given her strongly positive ER and PR, and second episode of DCIS, I started her on antiestrogen therapy with Tamoxifen on 02/28/20. She is tolerating well. Plan for 5 years. She started at 10 mg and increased to 20 mg. She was taking 10 mg in the morning and 10 mg at night. She is okay to take 20 mg once a day.   -Most recent mammography from 10/02/20 was negative. -Labs reviewed, potassium 3.3 today (10/19/20). I recommended she increase her potassium increase with diet or with an over-the-counter supplement. -She is doing well clinically, no concerns on physical exam today. She will return for f/u in 6 months.   2. 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 She is s/p left lumpectomy and adjuvant radiation.  -At the time she declined chemoprevention with antiestrogen  therapy.   3. Hypertension and dyslipidemia, weight gain -Continue medication and follow-up with primary care physician -weight is stable     PLAN: -Increase potassium intake -continue tamoxifen 20 mg daily, I refilled today. -labs and f/u in 6 months   No problem-specific Assessment & Plan notes found for this encounter.   No orders of the defined types were placed in this encounter.  All questions were answered. The patient knows to call the clinic with any problems, questions or concerns. No barriers to learning was detected. The total time spent in the appointment was 30 minutes.     Truitt Merle, MD 10/19/2020   I, Wilburn Mylar, am acting as scribe for Truitt Merle, MD.   I have reviewed the above documentation for accuracy and completeness, and I agree with the above.

## 2020-10-20 ENCOUNTER — Telehealth: Payer: Self-pay | Admitting: Hematology

## 2020-10-20 NOTE — Telephone Encounter (Signed)
Scheduled follow-up appointment per 6/23 los. Patient is aware.

## 2020-11-01 DIAGNOSIS — Z1389 Encounter for screening for other disorder: Secondary | ICD-10-CM | POA: Diagnosis not present

## 2020-11-01 DIAGNOSIS — C50411 Malignant neoplasm of upper-outer quadrant of right female breast: Secondary | ICD-10-CM | POA: Diagnosis not present

## 2020-11-01 DIAGNOSIS — E782 Mixed hyperlipidemia: Secondary | ICD-10-CM | POA: Diagnosis not present

## 2020-11-01 DIAGNOSIS — D0511 Intraductal carcinoma in situ of right breast: Secondary | ICD-10-CM | POA: Diagnosis not present

## 2020-11-01 DIAGNOSIS — I10 Essential (primary) hypertension: Secondary | ICD-10-CM | POA: Diagnosis not present

## 2020-11-01 DIAGNOSIS — Z0000 Encounter for general adult medical examination without abnormal findings: Secondary | ICD-10-CM | POA: Diagnosis not present

## 2020-11-14 ENCOUNTER — Other Ambulatory Visit: Payer: Medicare Other

## 2020-11-17 ENCOUNTER — Encounter (HOSPITAL_COMMUNITY): Payer: Self-pay

## 2021-04-15 NOTE — Progress Notes (Signed)
[place]   [phone] [phone]   Clinic Follow up Note   Patient Care Team: Donald Prose, MD as PCP - General (Family Medicine) Stark Klein, MD as Consulting Physician (General Surgery) Truitt Merle, MD as Consulting Physician (Hematology) Kyung Rudd, MD as Consulting Physician (Radiation Oncology) Delice Bison, Charlestine Massed, NP as Nurse Practitioner (Hematology and Oncology) Alla Feeling, NP as Nurse Practitioner (Nurse Practitioner) 04/16/2021  CHIEF COMPLAINT: Follow up h/o bilateral DCIS  SUMMARY OF ONCOLOGIC HISTORY: Oncology History Overview Note  Cancer Staging Ductal carcinoma in situ (DCIS) of left breast Staging form: Breast, AJCC 8th Edition - Clinical stage from 08/25/2017: Stage 0 (cTis (DCIS), cN0, cM0, ER: Unknown, PR: Unknown, HER2: Not Assessed) - Signed by Truitt Merle, MD on 09/03/2017 Nuclear grade: G2 Laterality: Left - Pathologic: Stage 0 (pTis (DCIS), pN0, cM0, ER+, PR+) - Unsigned  Ductal carcinoma in situ (DCIS) of right breast Staging form: Breast, AJCC 8th Edition - Clinical stage from 10/06/2019: Stage 0 (cTis (DCIS), cN0, cM0, ER+, PR+, HER2: Not Assessed) - Signed by Alla Feeling, NP on 06/12/2020 Stage prefix: Initial diagnosis Nuclear grade: G2 - Pathologic stage from 06/12/2020: Stage 0 (pTis (DCIS), pN0, cM0, ER+, PR+, HER2: Not Assessed) - Signed by Alla Feeling, NP on 06/12/2020 Nuclear grade: G2     Ductal carcinoma in situ (DCIS) of left breast  08/21/2017 Mammogram   IMPRESSION: New grouped pleomorphic calcifications within the upper-outer quadrant of the LEFT breast, spanning 4 mm extent. This is a suspicious finding for which stereotactic biopsy is recommended.   08/25/2017 Initial Biopsy   Diagnosis 08/25/17 Breast, left, needle core biopsy, upper outer quadrant coil clip - DUCTAL CARCINOMA IN SITU WITH CALCIFICATIONS, INTERMEDIATE GRADE. - DUCTAL PAPILLOMA. - SEE MICROSCOPIC DESCRIPTION.    08/25/2017 Cancer Staging   Staging form: Breast, AJCC 8th Edition - Clinical stage from 08/25/2017: Stage 0 (cTis (DCIS), cN0, cM0, ER: Unknown, PR: Unknown, HER2: Not Assessed) - Signed by Truitt Merle, MD on 09/03/2017    08/29/2017 Initial Diagnosis   Ductal carcinoma in situ (DCIS) of left breast   09/10/2017 Surgery   left BREAST LUMPECTOMY WITH RADIOACTIVE SEED LOCALIZATION by Stark Klein, MD   09/10/2017 Pathology Results   09/10/2017 Surgical Pathology Diagnosis Breast, lumpectomy - DUCTAL CARCINOMA IN SITU, INTERMEDIATE NUCLEAR GRADE, WITH CALCIFICATIONS - MARGINS UNINVOLVED BY CARCINOMA - DUCT ECTASIA AND PERIDUCTULAR CHRONIC INFLAMMATION - FIBROADENOMATOID NODULE - PREVIOUS BIOPSY SITE CHANGES - SEE ONCOLOGY TABLE BELOW     10/08/2017 - 11/06/2017 Radiation Therapy   The patient initially received a dose of 42.56 Gy in 16 fractions to the breast using whole-breast tangent fields. This was delivered using a 3-D conformal technique. The patient then received a boost to the seroma. This delivered an additional 8 Gy in 4 fractions using a 3 field photon technique due to the depth of the seroma. The total dose was 50.56 Gy.    Anti-estrogen oral therapy   She declined anti-estrogen therapy due to concerns with side effects.    Ductal carcinoma in situ (DCIS) of right breast  09/29/2019 Mammogram   Diagnostic Mammogram on 09/29/19  IMPRESSION: Suspicious calcifications spanning 23m in the 12 o'clock region of the right breast.   10/06/2019 Initial Biopsy   Diagnosis Breast, right, needle core biopsy, upper central, coil clip - DUCTAL CARCINOMA IN SITU WITH CALCIFICATIONS, INTERMEDIATE NUCLEAR GRADE. - SEE MICROSCOPIC DESCRIPTION. Microscopic Comment Estrogen and progesterone receptors will be performed. PROGNOSTIC INDICATOR RESULTS: Immunohistochemical and morphometric  analysis performed manually Estrogen Receptor: 100%, STRONG STAINING INTESITY Progesterone Receptor: 80%,  STRONG STAINING INTESITY   10/06/2019 Cancer Staging   Staging form: Breast, AJCC 8th Edition - Clinical stage from 10/06/2019: Stage 0 (cTis (DCIS), cN0, cM0, ER+, PR+, HER2: Not Assessed) - Signed by Alla Feeling, NP on 06/12/2020 Stage prefix: Initial diagnosis Nuclear grade: G2    10/13/2019 Initial Diagnosis   Ductal carcinoma in situ (DCIS) of right breast   11/18/2019 Surgery   RIGHT BREAST LUMPECTOMY WITH RADIOACTIVE SEED LOCALIZATION with Dr Barry Dienes    11/18/2019 Pathology Results   FINAL MICROSCOPIC DIAGNOSIS:   A. BREAST, RIGHT, LUMPECTOMY:  - Ductal carcinoma in situ with calcifications, intermediate grade with  focal necrosis, 1.3 cm  - No evidence of invasive carcinoma  - DCIS focally involves the lateral margin and is 0.3 cm from posterior  margin  - Atypical ductal hyperplasia, see comment  - Biopsy site changes  - See oncology table    COMMENT:   The small focus of ADH has a dissimilar appearance from rest of the DCIS  and is about 1 mm from the posterior margin.    12/22/2019 Surgery   RIGHT BREAST RE-EXCISION OF BREAST LUMPECTOMY with Dr Barry Dienes   12/22/2019 Pathology Results   FINAL MICROSCOPIC DIAGNOSIS:   A. BREAST, RIGHT, LATERAL MARGIN, RE-EXCISION:  - Resection site changes.  - Fibrocystic change.  - No malignancy identified.    01/24/2020 - 02/18/2020 Radiation Therapy   Adjuvant Radiation with Dr Lisbeth Renshaw    02/2020 -  Anti-estrogen oral therapy   Tamoxifen 32m starting 02/2020, will start at 190mand if tolerable will increase 2034m   06/12/2020 Survivorship   SCP reviewed virtually by LacCira RueP. To be mailed to patient after visit    06/12/2020 Cancer Staging   Staging form: Breast, AJCC 8th Edition - Pathologic stage from 06/12/2020: Stage 0 (pTis (DCIS), pN0, cM0, ER+, PR+, HER2: Not Assessed) - Signed by BurAlla FeelingP on 06/12/2020 Nuclear grade: G2      CURRENT THERAPY: Tamoxifen starting 20 mg starting 02/2020 initially  began 10 mg and increased to 20 mg   INTERVAL HISTORY: Victoria Fuller for follow up as scheduled. Last seen 10/19/20 by Dr. FenBurr Medicohe had covid in the summer, symptoms were cough, headache, weakness. She had to r/s DEXA to 04/25/21. She has recovered. She continues tamoxifen, tolerating except scattered skin eruptions on her legs. They look like bruises but are not painful and don't resolve like bruises. Denies bleeding.  She has some aching in her legs due to being up and active more on the holidays.  Compression stockings help.  She wears a compression bra and an insert to help ease the seroma on her right breast.  Denies new lump/mass, nipple discharge or inversion, or skin change.  Denies hot flashes, change in bowel habits, abdominal pain or bloating, or other new complaints.   MEDICAL HISTORY:  Past Medical History:  Diagnosis Date   Cancer ([facility]/2019   Left breast cancer   Hyperlipidemia    Hypertension    Personal history of radiation therapy    left and right breasts   Wears contact lenses     SURGICAL HISTORY: Past Surgical History:  Procedure Laterality Date   ABDOMINAL HYSTERECTOMY     BREAST BIOPSY Left 08/25/2017   BREAST BIOPSY Right 10/06/2019   BREAST LUMPECTOMY Left 08/31/2017   BREAST LUMPECTOMY Right 11/18/2019   BREAST LUMPECTOMY WITH RADIOACTIVE SEED  LOCALIZATION Left 09/10/2017   Procedure: BREAST LUMPECTOMY WITH RADIOACTIVE SEED LOCALIZATION;  Surgeon: Stark Klein, MD;  Location: [facility];  Service: General;  Laterality: Left;   BREAST LUMPECTOMY WITH RADIOACTIVE SEED LOCALIZATION Right 11/18/2019   Procedure: RIGHT BREAST LUMPECTOMY WITH RADIOACTIVE SEED LOCALIZATION;  Surgeon: Stark Klein, MD;  Location: [facility];  Service: General;  Laterality: Right;   COLONOSCOPY W/ BIOPSIES AND POLYPECTOMY     DIAGNOSTIC LAPAROSCOPY     uterine polyps   RE-EXCISION OF BREAST LUMPECTOMY Right 12/22/2019   Procedure: RIGHT BREAST RE-EXCISION OF  BREAST LUMPECTOMY;  Surgeon: Stark Klein, MD;  Location: [facility];  Service: General;  Laterality: Right;   WISDOM TOOTH EXTRACTION      I have reviewed the social history and family history with the patient and they are unchanged from previous note.  ALLERGIES:  has No Known Allergies.  MEDICATIONS:  Current Outpatient Medications  Medication Sig Dispense Refill   amLODipine (NORVASC) 5 MG tablet Take 5 mg by mouth daily.     aspirin EC 81 MG tablet Take 81 mg by mouth 3 (three) times a week. Swallow whole.     atorvastatin (LIPITOR) 10 MG tablet Take 10 mg by mouth every other day.      hydrochlorothiazide (MICROZIDE) 12.5 MG capsule Take 12.5 mg by mouth daily.     Multiple Vitamin (MULTIVITAMIN WITH MINERALS) TABS tablet Take 1 tablet by mouth daily.     tamoxifen (NOLVADEX) 20 MG tablet Take 1 tablet (20 mg total) by mouth daily. 30 tablet 5   No current facility-administered medications for this visit.    PHYSICAL EXAMINATION: ECOG PERFORMANCE STATUS: 0 - Asymptomatic  Vitals:   04/16/21 1051  BP: (!) 146/76  Pulse: 76  Resp: 17  Temp: 97.9 F (36.6 C)  SpO2: 100%   Filed Weights   04/16/21 1051  Weight: 181 lb 1.6 oz (82.1 kg)    GENERAL:alert, no distress and comfortable SKIN: Scattered areas of hyperpigmentation on the lower legs bilaterally EYES:  sclera clear NECK: Without mass LYMPH:  no palpable cervical or supraclavicular lymphadenopathy  LUNGS: clear with normal breathing effort HEART: regular rate & rhythm, no lower extremity edema ABDOMEN:abdomen soft, non-tender and normal bowel sounds Musculoskeletal: Nonfocal NEURO: alert & oriented x 3 with fluent speech, no focal motor/sensory deficits Breast exam: Breasts are symmetric without nipple discharge or inversion.  S/p bilateral lumpectomies, incisions completely healed with mild scar tissue.  No palpable mass or nodularity in either breast or axilla that I could appreciate.  LABORATORY DATA:  I have  reviewed the data as listed CBC Latest Ref Rng & Units 04/16/2021 10/19/2020 06/12/2020  WBC 4.0 - 10.5 K/uL 2.8(L) 3.1(L) 2.9(L)  Hemoglobin 12.0 - 15.0 g/dL 12.9 12.7 13.2  Hematocrit 36.0 - 46.0 % 37.9 37.1 38.6  Platelets 150 - 400 K/uL 159 143(L) 160     CMP Latest Ref Rng & Units 04/16/2021 10/19/2020 06/12/2020  Glucose 70 - 99 mg/dL 110(H) 106(H) 113(H)  BUN 8 - 23 mg/dL 7(L) 8 9  Creatinine 0.44 - 1.00 mg/dL 0.72 0.69 0.78  Sodium 135 - 145 mmol/L 142 142 140  Potassium 3.5 - 5.1 mmol/L 3.5 3.3(L) 3.4(L)  Chloride 98 - 111 mmol/L 106 104 104  CO2 22 - 32 mmol/L _0 Calcium 8.9 - 10.3 mg/dL 9.1 9.2 9.5  Total Protein 6.5 - 8.1 g/dL 6.7 6.6 6.9  Total Bilirubin 0.3 - 1.2 mg/dL 1.0 1.2 1.3(H)  Alkaline  Phos 38 - 126 U/L 47 46 51  AST 15 - 41 U/L 20 19 14(L)  ALT 0 - 44 U/L _0 RADIOGRAPHIC STUDIES: I have personally reviewed the radiological images as listed and agreed with the findings in the report. No results found.   ASSESSMENT & PLAN: Victoria Fuller is a 70 y.o. female with    1. Ductal Carcinoma in situ (DCIS) of Right breast, ER/PR+, Intermediate grade -Diagnosed in 09/2019. S/p right breast lumpectomy on 11/18/19 and re-excision on 12/22/19 with Dr Barry Dienes and no further malignancy was found. she completed adjuvant radiation with Dr Lisbeth Renshaw on 01/24/20-02/18/20.  -She previously declined genetic testing.  -She started adjuvant Tamoxifen on 02/28/20, Plan for 5 years.  -Most recent mammography from 10/02/20 was negative. -doing well on surveillance   2. 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 Stage 0 grade 2, ER+/PR+, G2 -Diagnosed in 07/2017, S/p left lumpectomy and adjuvant radiation.  -At the time she declined chemoprevention with antiestrogen therapy.   3. Hypertension and dyslipidemia, weight gain -Continue medication and follow-up with primary care physician -weight is stable    Disposition: Victoria Fuller is  clinically doing well.  Tolerating tamoxifen without significant side effects except scattered skin hyperpigmentation on the lower extremities. Breast exam is benign, labs are unremarkable except ANC 1.5 in the setting of chronic mild leukopenia.  We reviewed infection precautions.  Overall there is no clinical concern for breast cancer recurrence or new malignancy.  Continue tamoxifen, surveillance, and healthy lifestyle with age-appropriate health promotion/disease prevention. Next mammogram 09/2021.  DEXA scheduled 04/25/2021.  F/up 6 months, or sooner if needed.   All questions were answered. The patient knows to call the clinic with any problems, questions or concerns. No barriers to learning were detected.     Alla Feeling, NP 04/16/21

## 2021-04-16 ENCOUNTER — Inpatient Hospital Stay: Payer: Medicare Other

## 2021-04-16 ENCOUNTER — Inpatient Hospital Stay: Payer: Medicare Other | Attending: Nurse Practitioner | Admitting: Nurse Practitioner

## 2021-04-16 ENCOUNTER — Encounter: Payer: Self-pay | Admitting: Nurse Practitioner

## 2021-04-16 ENCOUNTER — Other Ambulatory Visit: Payer: Self-pay

## 2021-04-16 VITALS — BP 146/76 | HR 76 | Temp 97.9°F | Resp 17 | Ht 69.0 in | Wt 181.1 lb

## 2021-04-16 DIAGNOSIS — Z170 Estrogen receptor positive status [ER+]: Secondary | ICD-10-CM | POA: Diagnosis not present

## 2021-04-16 DIAGNOSIS — Z923 Personal history of irradiation: Secondary | ICD-10-CM | POA: Insufficient documentation

## 2021-04-16 DIAGNOSIS — R635 Abnormal weight gain: Secondary | ICD-10-CM | POA: Insufficient documentation

## 2021-04-16 DIAGNOSIS — E785 Hyperlipidemia, unspecified: Secondary | ICD-10-CM | POA: Insufficient documentation

## 2021-04-16 DIAGNOSIS — N6489 Other specified disorders of breast: Secondary | ICD-10-CM | POA: Insufficient documentation

## 2021-04-16 DIAGNOSIS — D72819 Decreased white blood cell count, unspecified: Secondary | ICD-10-CM | POA: Diagnosis not present

## 2021-04-16 DIAGNOSIS — D0512 Intraductal carcinoma in situ of left breast: Secondary | ICD-10-CM | POA: Insufficient documentation

## 2021-04-16 DIAGNOSIS — L819 Disorder of pigmentation, unspecified: Secondary | ICD-10-CM | POA: Insufficient documentation

## 2021-04-16 DIAGNOSIS — I10 Essential (primary) hypertension: Secondary | ICD-10-CM | POA: Insufficient documentation

## 2021-04-16 DIAGNOSIS — Z79899 Other long term (current) drug therapy: Secondary | ICD-10-CM | POA: Diagnosis not present

## 2021-04-16 DIAGNOSIS — D0511 Intraductal carcinoma in situ of right breast: Secondary | ICD-10-CM | POA: Insufficient documentation

## 2021-04-16 LAB — CBC WITH DIFFERENTIAL (CANCER CENTER ONLY)
Abs Immature Granulocytes: 0.01 10*3/uL (ref 0.00–0.07)
Basophils Absolute: 0 10*3/uL (ref 0.0–0.1)
Basophils Relative: 1 %
Eosinophils Absolute: 0.1 10*3/uL (ref 0.0–0.5)
Eosinophils Relative: 3 %
HCT: 37.9 % (ref 36.0–46.0)
Hemoglobin: 12.9 g/dL (ref 12.0–15.0)
Immature Granulocytes: 0 %
Lymphocytes Relative: 31 %
Lymphs Abs: 0.9 10*3/uL (ref 0.7–4.0)
MCH: 29.4 pg (ref 26.0–34.0)
MCHC: 34 g/dL (ref 30.0–36.0)
MCV: 86.3 fL (ref 80.0–100.0)
Monocytes Absolute: 0.3 10*3/uL (ref 0.1–1.0)
Monocytes Relative: 10 %
Neutro Abs: 1.5 10*3/uL — ABNORMAL LOW (ref 1.7–7.7)
Neutrophils Relative %: 55 %
Platelet Count: 159 10*3/uL (ref 150–400)
RBC: 4.39 MIL/uL (ref 3.87–5.11)
RDW: 12.5 % (ref 11.5–15.5)
WBC Count: 2.8 10*3/uL — ABNORMAL LOW (ref 4.0–10.5)
nRBC: 0 % (ref 0.0–0.2)

## 2021-04-16 LAB — CMP (CANCER CENTER ONLY)
ALT: 26 U/L (ref 0–44)
AST: 20 U/L (ref 15–41)
Albumin: 4.1 g/dL (ref 3.5–5.0)
Alkaline Phosphatase: 47 U/L (ref 38–126)
Anion gap: 7 (ref 5–15)
BUN: 7 mg/dL — ABNORMAL LOW (ref 8–23)
CO2: 29 mmol/L (ref 22–32)
Calcium: 9.1 mg/dL (ref 8.9–10.3)
Chloride: 106 mmol/L (ref 98–111)
Creatinine: 0.72 mg/dL (ref 0.44–1.00)
GFR, Estimated: >60 mL/min (ref >60)
Glucose, Bld: 110 mg/dL — ABNORMAL HIGH (ref 70–99)
Potassium: 3.5 mmol/L (ref 3.5–5.1)
Sodium: 142 mmol/L (ref 135–145)
Total Bilirubin: 1 mg/dL (ref 0.3–1.2)
Total Protein: 6.7 g/dL (ref 6.5–8.1)

## 2021-04-18 ENCOUNTER — Telehealth: Payer: Self-pay | Admitting: Hematology

## 2021-04-18 NOTE — Telephone Encounter (Signed)
Scheduled follow-up appointment per 12/19 los. Patient is aware. Mailed calendar.

## 2021-04-25 ENCOUNTER — Ambulatory Visit
Admission: RE | Admit: 2021-04-25 | Discharge: 2021-04-25 | Disposition: A | Discharge status: Home / Self Care | Payer: Medicare Other | Source: Ambulatory Visit | Attending: Nurse Practitioner | Admitting: Nurse Practitioner

## 2021-04-25 DIAGNOSIS — Z780 Asymptomatic menopausal state: Secondary | ICD-10-CM | POA: Diagnosis not present

## 2021-04-25 DIAGNOSIS — D0511 Intraductal carcinoma in situ of right breast: Secondary | ICD-10-CM

## 2021-05-11 ENCOUNTER — Other Ambulatory Visit: Payer: Self-pay | Admitting: Hematology

## 2021-05-14 ENCOUNTER — Telehealth: Payer: Self-pay

## 2021-05-14 NOTE — Telephone Encounter (Signed)
Pt was left a voicemail due to no answer. Pt was told per Victoria Rue NP, her bone density is normal and to continue taking calcium, vitamin D, and doing weight bearing exercises. Pt was told to please give the office a call back if she has any questions or concerns.

## 2021-05-14 NOTE — Telephone Encounter (Signed)
-----   Message from Alla Feeling, NP sent at 05/12/2021 10:21 AM EST ----- Please let pt know DEXA/bone density is normal, continue calcium/vit D and weight bearing exercise.  Thanks, Regan Rakers, NP

## 2021-06-11 ENCOUNTER — Other Ambulatory Visit: Payer: Self-pay | Admitting: Hematology

## 2021-06-29 IMAGING — MG MM PLC BREAST LOC DEV 1ST LESION INC*R*
8 of 10 series · 8 of 10 positions shown · non-contrast
Comparison: Previous exam(s).

CLINICAL DATA: Localization prior to surgery

EXAM:
MAMMOGRAPHIC GUIDED RADIOACTIVE SEED LOCALIZATION OF THE RIGHT
BREAST

[R CC (1 of 4)]
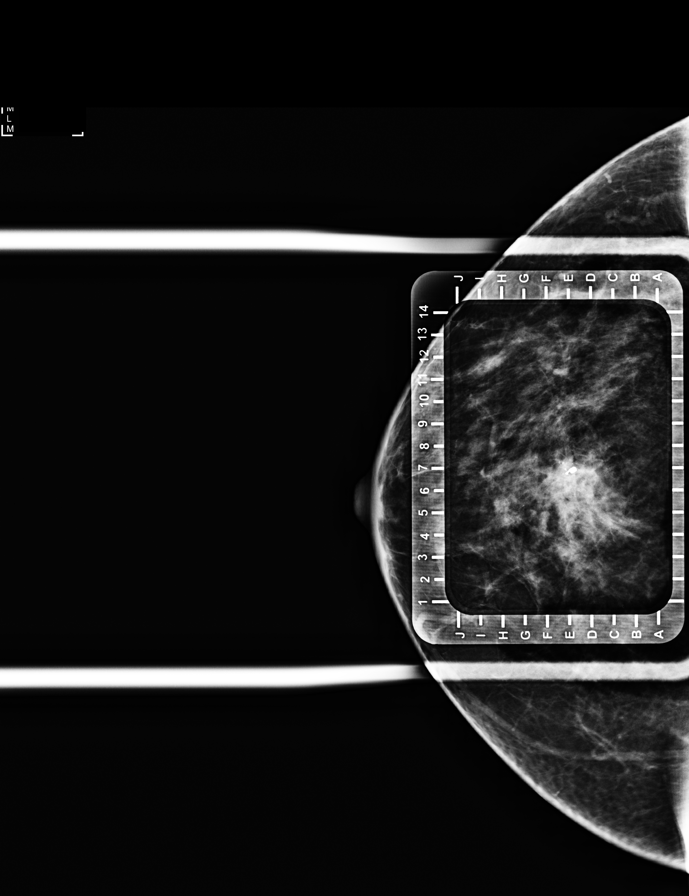

[R CC (2 of 4)]
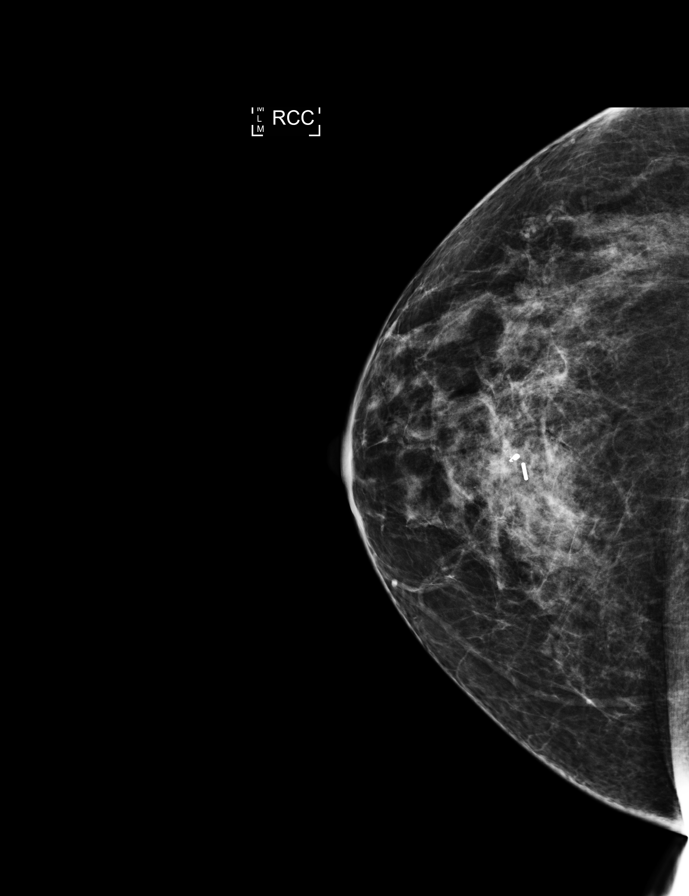

[R ML (1 of 4)]
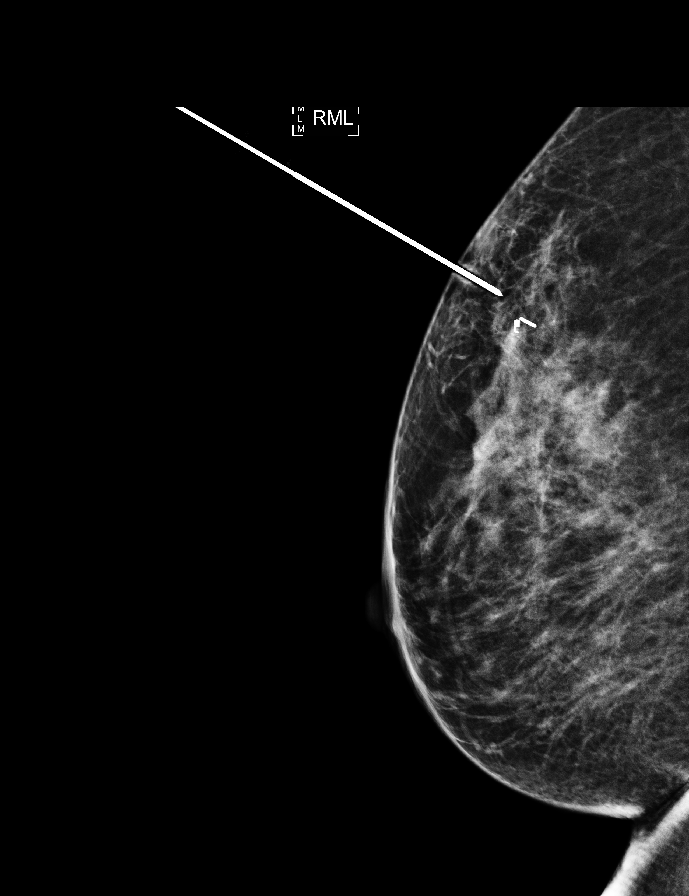

[R ML (2 of 4)]
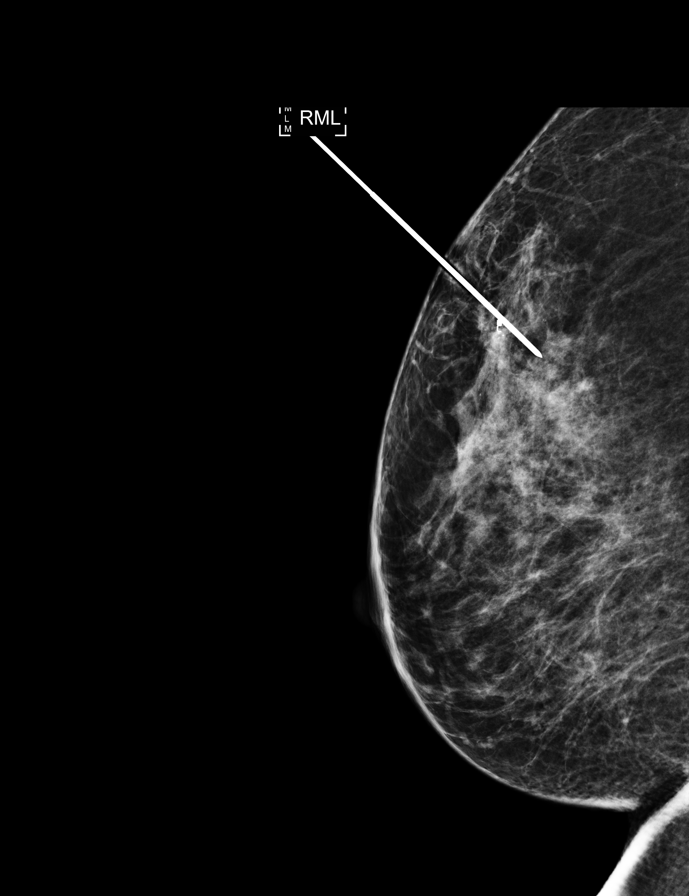

[R CC (3 of 4)]
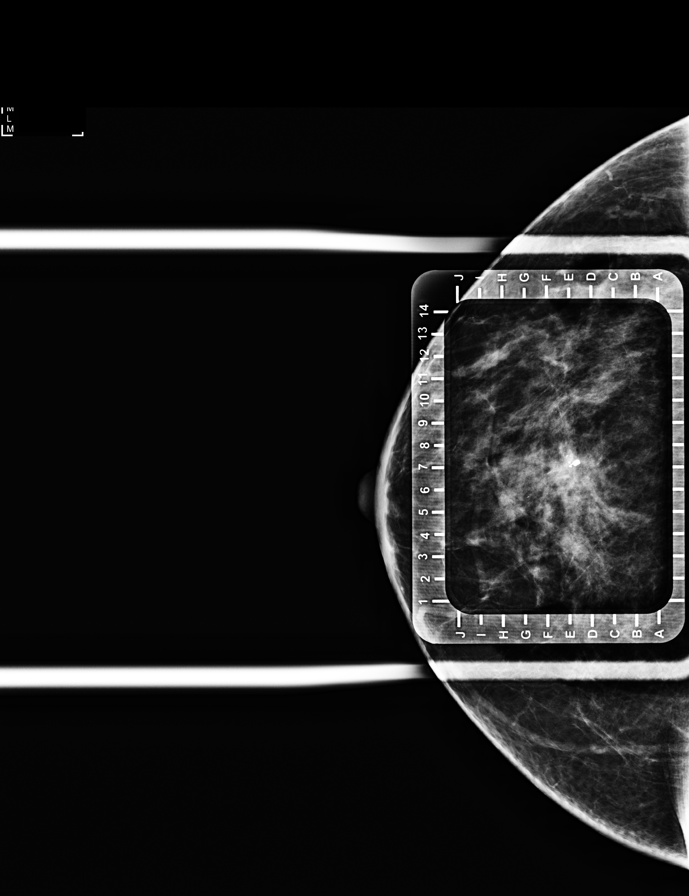

[R CC (4 of 4)]
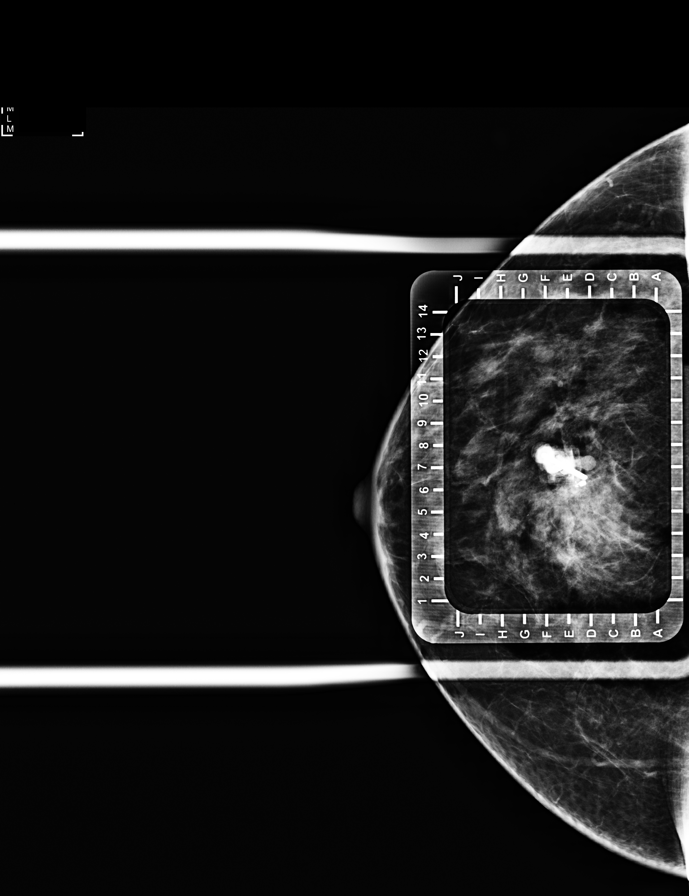

[R ML (3 of 4)]
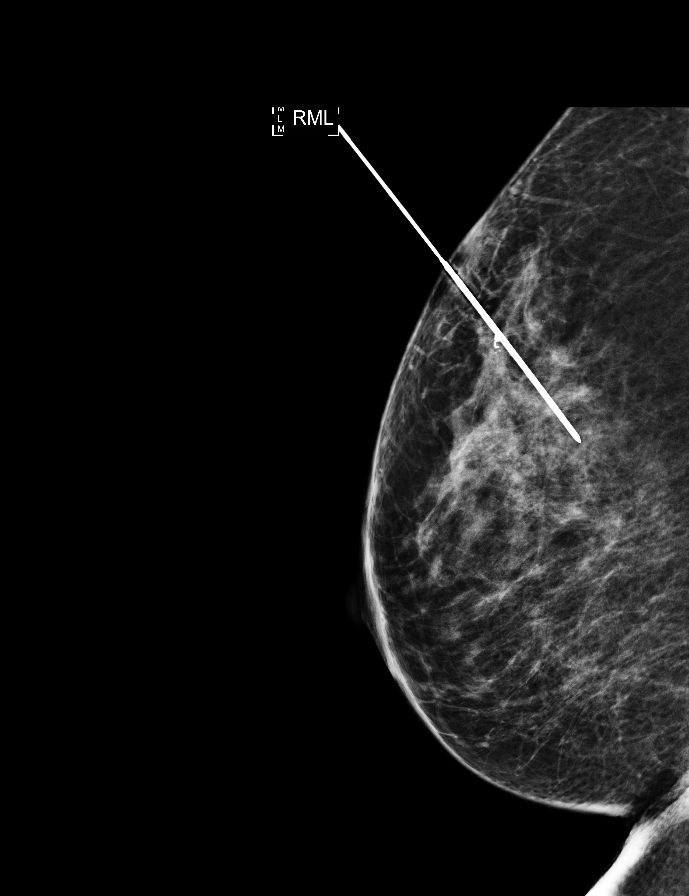

[R ML (4 of 4)]
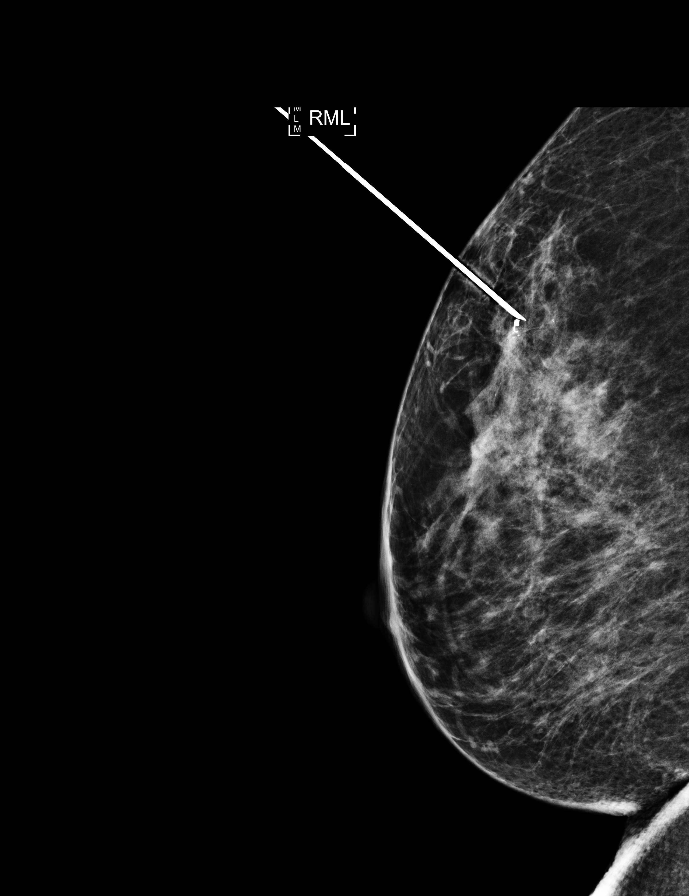

[8 of 10 positions shown; findings below may reference images not displayed]



The usual time-out protocol was performed immediately prior to the
procedure.

Using mammographic guidance, sterile technique, 1% lidocaine and an
[id number] radioactive seed, the coil shaped biopsy clip was localized
using a superior approach. The follow-up mammogram images confirm
the seed in the expected location and were marked for the surgeon.

Follow-up survey of the patient confirms presence of the radioactive
seed.

Order number of [id number] seed:  [id number].

Total activity:  0.253 millicuries reference Date: November 04, 2019

The patient tolerated the procedure well and was released from the
[REDACTED]. She was given instructions regarding seed removal.
IMPRESSION: Radioactive seed localization right breast. No apparent
complications.

## 2021-07-16 ENCOUNTER — Other Ambulatory Visit: Payer: Self-pay | Admitting: Hematology

## 2021-07-16 ENCOUNTER — Other Ambulatory Visit: Payer: Self-pay | Admitting: Nurse Practitioner

## 2021-08-18 ENCOUNTER — Other Ambulatory Visit: Payer: Self-pay | Admitting: Nurse Practitioner

## 2021-09-25 ENCOUNTER — Other Ambulatory Visit: Payer: Self-pay | Admitting: Nurse Practitioner

## 2021-10-03 ENCOUNTER — Ambulatory Visit
Admission: RE | Admit: 2021-10-03 | Discharge: 2021-10-03 | Disposition: A | Discharge status: Home / Self Care | Payer: Medicare Other | Source: Ambulatory Visit | Attending: Nurse Practitioner | Admitting: Nurse Practitioner

## 2021-10-03 DIAGNOSIS — Z853 Personal history of malignant neoplasm of breast: Secondary | ICD-10-CM | POA: Diagnosis not present

## 2021-10-03 DIAGNOSIS — D0511 Intraductal carcinoma in situ of right breast: Secondary | ICD-10-CM

## 2021-10-03 DIAGNOSIS — D0512 Intraductal carcinoma in situ of left breast: Secondary | ICD-10-CM

## 2021-10-03 DIAGNOSIS — R922 Inconclusive mammogram: Secondary | ICD-10-CM | POA: Diagnosis not present

## 2021-10-05 ENCOUNTER — Other Ambulatory Visit: Payer: Self-pay

## 2021-10-05 DIAGNOSIS — D0511 Intraductal carcinoma in situ of right breast: Secondary | ICD-10-CM

## 2021-10-05 DIAGNOSIS — D0512 Intraductal carcinoma in situ of left breast: Secondary | ICD-10-CM

## 2021-10-07 NOTE — Progress Notes (Unsigned)
[place]   [phone] [phone]   Clinic Follow up Note   Patient Care Team: Victoria Prose, MD as PCP - General (Family Medicine) Victoria Klein, MD as Consulting Physician (General Surgery) Victoria Merle, MD as Consulting Physician (Hematology) Victoria Rudd, MD as Consulting Physician (Radiation Oncology) Victoria Fuller, Victoria Massed, NP as Nurse Practitioner (Hematology and Oncology) Victoria Feeling, NP as Nurse Practitioner (Nurse Practitioner) 10/08/2021  CHIEF COMPLAINT: Follow up h/o bilateral DCIS  SUMMARY OF ONCOLOGIC HISTORY: Oncology History Overview Note  Cancer Staging Ductal carcinoma in situ (DCIS) of left breast Staging form: Breast, AJCC 8th Edition - Clinical stage from 08/25/2017: Stage 0 (cTis (DCIS), cN0, cM0, ER: Unknown, PR: Unknown, HER2: Not Assessed) - Signed by Victoria Merle, MD on 09/03/2017 Nuclear grade: G2 Laterality: Left - Pathologic: Stage 0 (pTis (DCIS), pN0, cM0, ER+, PR+) - Unsigned  Ductal carcinoma in situ (DCIS) of right breast Staging form: Breast, AJCC 8th Edition - Clinical stage from 10/06/2019: Stage 0 (cTis (DCIS), cN0, cM0, ER+, PR+, HER2: Not Assessed) - Signed by Victoria Feeling, NP on 06/12/2020 Stage prefix: Initial diagnosis Nuclear grade: G2 - Pathologic stage from 06/12/2020: Stage 0 (pTis (DCIS), pN0, cM0, ER+, PR+, HER2: Not Assessed) - Signed by Victoria Feeling, NP on 06/12/2020 Nuclear grade: G2     Ductal carcinoma in situ (DCIS) of left breast  08/21/2017 Mammogram   IMPRESSION: New grouped pleomorphic calcifications within the upper-outer quadrant of the LEFT breast, spanning 4 mm extent. This is a suspicious finding for which stereotactic biopsy is recommended.   08/25/2017 Initial Biopsy   Diagnosis 08/25/17 Breast, left, needle core biopsy, upper outer quadrant coil clip - DUCTAL CARCINOMA IN SITU WITH CALCIFICATIONS, INTERMEDIATE GRADE. - DUCTAL PAPILLOMA. - SEE MICROSCOPIC DESCRIPTION.    08/25/2017 Cancer Staging   Staging form: Breast, AJCC 8th Edition - Clinical stage from 08/25/2017: Stage 0 (cTis (DCIS), cN0, cM0, ER: Unknown, PR: Unknown, HER2: Not Assessed) - Signed by Victoria Merle, MD on 09/03/2017   08/29/2017 Initial Diagnosis   Ductal carcinoma in situ (DCIS) of left breast   09/10/2017 Surgery   left BREAST LUMPECTOMY WITH RADIOACTIVE SEED LOCALIZATION by Victoria Klein, MD   09/10/2017 Pathology Results   09/10/2017 Surgical Pathology Diagnosis Breast, lumpectomy - DUCTAL CARCINOMA IN SITU, INTERMEDIATE NUCLEAR GRADE, WITH CALCIFICATIONS - MARGINS UNINVOLVED BY CARCINOMA - DUCT ECTASIA AND PERIDUCTULAR CHRONIC INFLAMMATION - FIBROADENOMATOID NODULE - PREVIOUS BIOPSY SITE CHANGES - SEE ONCOLOGY TABLE BELOW     10/08/2017 - 11/06/2017 Radiation Therapy   The patient initially received a dose of 42.56 Gy in 16 fractions to the breast using whole-breast tangent fields. This was delivered using a 3-D conformal technique. The patient then received a boost to the seroma. This delivered an additional 8 Gy in 4 fractions using a 3 field photon technique due to the depth of the seroma. The total dose was 50.56 Gy.    Anti-estrogen oral therapy   She declined anti-estrogen therapy due to concerns with side effects.    Ductal carcinoma in situ (DCIS) of right breast  09/29/2019 Mammogram   Diagnostic Mammogram on 09/29/19  IMPRESSION: Suspicious calcifications spanning 65m in the 12 o'clock region of the right breast.   10/06/2019 Initial Biopsy   Diagnosis Breast, right, needle core biopsy, upper central, coil clip - DUCTAL CARCINOMA IN SITU WITH CALCIFICATIONS, INTERMEDIATE NUCLEAR GRADE. - SEE MICROSCOPIC DESCRIPTION. Microscopic Comment Estrogen and progesterone receptors will be performed. PROGNOSTIC INDICATOR RESULTS: Immunohistochemical and morphometric analysis  performed manually Estrogen Receptor: 100%, STRONG STAINING INTESITY Progesterone Receptor: 80%, STRONG  STAINING INTESITY   10/06/2019 Cancer Staging   Staging form: Breast, AJCC 8th Edition - Clinical stage from 10/06/2019: Stage 0 (cTis (DCIS), cN0, cM0, ER+, PR+, HER2: Not Assessed) - Signed by Victoria Feeling, NP on 06/12/2020 Stage prefix: Initial diagnosis Nuclear grade: G2   10/13/2019 Initial Diagnosis   Ductal carcinoma in situ (DCIS) of right breast   11/18/2019 Surgery   RIGHT BREAST LUMPECTOMY WITH RADIOACTIVE SEED LOCALIZATION with Dr Victoria Fuller    11/18/2019 Pathology Results   FINAL MICROSCOPIC DIAGNOSIS:   A. BREAST, RIGHT, LUMPECTOMY:  - Ductal carcinoma in situ with calcifications, intermediate grade with  focal necrosis, 1.3 cm  - No evidence of invasive carcinoma  - DCIS focally involves the lateral margin and is 0.3 cm from posterior  margin  - Atypical ductal hyperplasia, see comment  - Biopsy site changes  - See oncology table    COMMENT:   The small focus of ADH has a dissimilar appearance from rest of the DCIS  and is about 1 mm from the posterior margin.    12/22/2019 Surgery   RIGHT BREAST RE-EXCISION OF BREAST LUMPECTOMY with Dr Victoria Fuller   12/22/2019 Pathology Results   FINAL MICROSCOPIC DIAGNOSIS:   A. BREAST, RIGHT, LATERAL MARGIN, RE-EXCISION:  - Resection site changes.  - Fibrocystic change.  - No malignancy identified.    01/24/2020 - 02/18/2020 Radiation Therapy   Adjuvant Radiation with Dr Victoria Fuller    02/2020 -  Anti-estrogen oral therapy   Tamoxifen 31m starting 02/2020, will start at 161mand if tolerable will increase 2077m   06/12/2020 Survivorship   SCP reviewed virtually by LacCira Fuller. To be mailed to patient after visit    06/12/2020 Cancer Staging   Staging form: Breast, AJCC 8th Edition - Pathologic stage from 06/12/2020: Stage 0 (pTis (DCIS), pN0, cM0, ER+, PR+, HER2: Not Assessed) - Signed by Victoria Fuller on 06/12/2020 Nuclear grade: G2     CURRENT THERAPY: Tamoxifen starting 20 mg starting 02/2020 initially began 10 mg  and increased to 20 mg  INTERVAL HISTORY: [name] for follow up as scheduled. Last seen by me 04/16/21. DEXA 04/25/21 showed normal bone density. Mammogram 10/03/21 showed treatment changes, negative for malignancy. She continues tamoxifen.  She reports mild hip pain when going up steps, not limiting function or activity.  She continues exercising.  She takes her multivitamins when she remembers.  She continues to have the discoloration on her legs, not painful or itching more of a cosmetic issue.  She has to put make-up on when wearing shorts.  There have been no new eruptions but the previous ones have not resolved.  Feet swell occasionally at night, resolves when elevated.  Denies concerns in her breast such as new lump/mass, nipple discharge or inversion, or skin change.  She wears a compression bra.  All other systems were reviewed with the patient and are negative.  MEDICAL HISTORY:  Past Medical History:  Diagnosis Date   [place]   Left [place] cancer   Hyperlipidemia    Hypertension    Personal history of radiation therapy    left and right breasts   Wears contact lenses     SURGICAL HISTORY: Past Surgical History:  Procedure Laterality Date   ABDOMINAL HYSTERECTOMY     BREAST BIOPSY Left 08/25/2017   BREAST BIOPSY Right 10/06/2019   BREAST LUMPECTOMY Left 08/31/2017  BREAST LUMPECTOMY Right 11/18/2019   BREAST LUMPECTOMY WITH RADIOACTIVE SEED LOCALIZATION Left 09/10/2017   Procedure: BREAST LUMPECTOMY WITH RADIOACTIVE SEED LOCALIZATION;  Surgeon: Victoria Klein, MD;  Location: [facility];  Service: General;  Laterality: Left;   BREAST LUMPECTOMY WITH RADIOACTIVE SEED LOCALIZATION Right 11/18/2019   Procedure: RIGHT BREAST LUMPECTOMY WITH RADIOACTIVE SEED LOCALIZATION;  Surgeon: Victoria Klein, MD;  Location: [facility];  Service: General;  Laterality: Right;   COLONOSCOPY W/ BIOPSIES AND POLYPECTOMY     DIAGNOSTIC LAPAROSCOPY     uterine polyps    RE-EXCISION OF BREAST LUMPECTOMY Right 12/22/2019   Procedure: RIGHT BREAST RE-EXCISION OF BREAST LUMPECTOMY;  Surgeon: Victoria Klein, MD;  Location: [facility];  Service: General;  Laterality: Right;   WISDOM TOOTH EXTRACTION      I have reviewed the social history and family history with the patient and they are unchanged from previous note.  ALLERGIES:  has No Known Allergies.  MEDICATIONS:  Current Outpatient Medications  Medication Sig Dispense Refill   potassium chloride SA (KLOR-CON M) 20 MEQ tablet Take 1 tablet twice daily for 5 days then once daily 30 tablet 3   amLODipine (NORVASC) 5 MG tablet Take 5 mg by mouth daily.     aspirin EC 81 MG tablet Take 81 mg by mouth 3 (three) times a week. Swallow whole.     atorvastatin (LIPITOR) 10 MG tablet Take 10 mg by mouth every other day.      hydrochlorothiazide (MICROZIDE) 12.5 MG capsule Take 12.5 mg by mouth daily.     Multiple Vitamin (MULTIVITAMIN WITH MINERALS) TABS tablet Take 1 tablet by mouth daily.     tamoxifen (NOLVADEX) 20 MG tablet Take 1 tablet (20 mg total) by mouth daily. 90 tablet 3   No current facility-administered medications for this visit.    PHYSICAL EXAMINATION: ECOG PERFORMANCE STATUS: 1 - Symptomatic but completely ambulatory  Vitals:   10/08/21 1039  BP: 138/77  Pulse: 69  Resp: 16  Temp: 98.7 F (37.1 C)  SpO2: 99%   Filed Weights   10/08/21 1039  Weight: 180 lb 6.4 oz (81.8 kg)    GENERAL:alert, no distress and comfortable SKIN: Scattered hyperpigmentation to the lower legs, no rash EYES: sclera clear NECK: Without mass LYMPH:  no palpable cervical or supraclavicular lymphadenopathy LUNGS:  normal breathing effort HEART:  no lower extremity edema ABDOMEN:abdomen soft, non-tender and normal bowel sounds Musculoskeletal: No focal spinal tenderness NEURO: alert & oriented x 3 with fluent speech, no focal motor/sensory deficits Breast exam: Breasts are symmetrical without nipple discharge or  inversion.  S/p bilateral lumpectomy and radiation, incisions completely healed.  No palpable mass or nodularity in either breast or axilla that I could appreciate.  LABORATORY DATA:  I have reviewed the data as listed    Latest Ref Rng & Units 10/08/2021   10:07 AM 04/16/2021   10:09 AM 10/19/2020   10:20 AM  CBC  WBC 4.0 - 10.5 K/uL 2.7  2.8  3.1   Hemoglobin 12.0 - 15.0 g/dL 12.8  12.9  12.7   Hematocrit 36.0 - 46.0 % 36.4  37.9  37.1   Platelets 150 - 400 K/uL 147  159  143         Latest Ref Rng & Units 10/08/2021   10:07 AM 04/16/2021   10:09 AM 10/19/2020   10:20 AM  CMP  Glucose 70 - 99 mg/dL 116  110  106   BUN 8 - 23 mg/dL 12  7  8   Creatinine 0.44 - 1.00 mg/dL 0.74  0.72  0.69   Sodium 135 - 145 mmol/L 141  142  142   Potassium 3.5 - 5.1 mmol/L 3.3  3.5  3.3   Chloride 98 - 111 mmol/L 105  106  104   CO2 22 - 32 mmol/L '29  29  27   ' Calcium 8.9 - 10.3 mg/dL 9.6  9.1  9.2   Total Protein 6.5 - 8.1 g/dL 6.7  6.7  6.6   Total Bilirubin 0.3 - 1.2 mg/dL 1.1  1.0  1.2   Alkaline Phos 38 - 126 U/L 40  47  46   AST 15 - 41 U/L '15  20  19   ' ALT 0 - 44 U/L '19  26  22       ' RADIOGRAPHIC STUDIES: I have personally reviewed the radiological images as listed and agreed with the findings in the report. No results found.   ASSESSMENT & PLAN: Victoria Fuller is a 71 y.o. female with    1. Ductal Carcinoma in situ (DCIS) of Right breast, ER/PR+, Intermediate grade -Diagnosed in 09/2019. S/p right breast lumpectomy on 11/18/19 and re-excision on 12/22/19 with Dr Victoria Fuller and no further malignancy was found. she completed adjuvant radiation with Dr Victoria Fuller on 01/24/20-02/18/20.  -She previously declined genetic testing.  -She started adjuvant Tamoxifen on 02/28/20, Plan for 5 years.  -Ms. Branscum is clinically doing well.  Tolerating tamoxifen with mild hip pain and skin hyperpigmentation to the lower legs.  She declined dermatology referral today, we will continue monitoring.  Breast  exam is benign, labs stable.  Most recent mammography from 10/03/21 was negative.  Overall there is no clinical concern for recurrence -K is persistently low, 3.3 today, likely related to HCTZ.  I prescribed potassium -Continue surveillance and tamoxifen -Follow-up in 6 months   2. 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 Stage 0 grade 2, ER+/PR+, G2 -Diagnosed in 07/2017, S/p left lumpectomy and adjuvant radiation.  -At the time she declined chemoprevention with antiestrogen therapy.   3. Skin hyperpigmentation -She has scattered hyperpigmentation to the lower legs, began after starting tamoxifen -No new eruptions but existing areas have not resolved -We discussed dermatology referral versus close monitoring, she prefers to monitor for now -This is mainly a cosmetic issue, she will let me know if side effects progress or impact quality of life  4. Leukopenia, thrombocytopenia  -She had mild leukopenia in 2019, WBC 3.4 -CBC 11/09/2019 shows WBC 2.9, platelet 143, no anemia -She continues to have a mild leukopenia/neutropenia and intermittent thrombocytopenia. -Denies history of chronic/recurrent infection, liver disease, spleen issue -the etiology is unclear, possibly related to tamoxifen.  We will continue monitoring -We reviewed infection and bleeding precautions  5. Hypertension and dyslipidemia, weight gain -Continue medication and follow-up with primary care physician -weight is stable     Plan: -Labs, DEXA, and mammogram reviewed -Continue breast cancer surveillance and tamoxifen (refilled) -Monitor skin -Begin K; BID x5 days then once daily -Follow-up in 6 months, or sooner if needed   All questions were answered. The patient knows to call the clinic with any problems, questions or concerns. No barriers to learning was detected. I spent 20 minutes counseling the patient face to face. The total time spent in the appointment was 30 minutes and  more than 50% was on counseling and review of test results     Victoria Feeling, NP 10/08/21

## 2021-10-08 ENCOUNTER — Other Ambulatory Visit: Payer: Self-pay

## 2021-10-08 ENCOUNTER — Inpatient Hospital Stay: Payer: Medicare Other | Attending: Nurse Practitioner | Admitting: Nurse Practitioner

## 2021-10-08 ENCOUNTER — Inpatient Hospital Stay: Payer: Medicare Other

## 2021-10-08 ENCOUNTER — Encounter: Payer: Self-pay | Admitting: Nurse Practitioner

## 2021-10-08 VITALS — BP 138/77 | HR 69 | Temp 98.7°F | Resp 16 | Ht 69.0 in | Wt 180.4 lb

## 2021-10-08 DIAGNOSIS — R635 Abnormal weight gain: Secondary | ICD-10-CM | POA: Diagnosis not present

## 2021-10-08 DIAGNOSIS — D0511 Intraductal carcinoma in situ of right breast: Secondary | ICD-10-CM | POA: Insufficient documentation

## 2021-10-08 DIAGNOSIS — E785 Hyperlipidemia, unspecified: Secondary | ICD-10-CM | POA: Insufficient documentation

## 2021-10-08 DIAGNOSIS — D0512 Intraductal carcinoma in situ of left breast: Secondary | ICD-10-CM | POA: Diagnosis not present

## 2021-10-08 DIAGNOSIS — D696 Thrombocytopenia, unspecified: Secondary | ICD-10-CM | POA: Diagnosis not present

## 2021-10-08 DIAGNOSIS — Z853 Personal history of malignant neoplasm of breast: Secondary | ICD-10-CM | POA: Diagnosis not present

## 2021-10-08 DIAGNOSIS — Z79899 Other long term (current) drug therapy: Secondary | ICD-10-CM | POA: Diagnosis not present

## 2021-10-08 DIAGNOSIS — L819 Disorder of pigmentation, unspecified: Secondary | ICD-10-CM | POA: Insufficient documentation

## 2021-10-08 DIAGNOSIS — I10 Essential (primary) hypertension: Secondary | ICD-10-CM | POA: Insufficient documentation

## 2021-10-08 DIAGNOSIS — M25559 Pain in unspecified hip: Secondary | ICD-10-CM | POA: Insufficient documentation

## 2021-10-08 DIAGNOSIS — D72819 Decreased white blood cell count, unspecified: Secondary | ICD-10-CM | POA: Diagnosis not present

## 2021-10-08 DIAGNOSIS — M7989 Other specified soft tissue disorders: Secondary | ICD-10-CM | POA: Diagnosis not present

## 2021-10-08 DIAGNOSIS — Z923 Personal history of irradiation: Secondary | ICD-10-CM | POA: Insufficient documentation

## 2021-10-08 LAB — CBC WITH DIFFERENTIAL/PLATELET
Abs Immature Granulocytes: 0.01 10*3/uL (ref 0.00–0.07)
Basophils Absolute: 0 10*3/uL (ref 0.0–0.1)
Basophils Relative: 1 %
Eosinophils Absolute: 0.1 10*3/uL (ref 0.0–0.5)
Eosinophils Relative: 2 %
HCT: 36.4 % (ref 36.0–46.0)
Hemoglobin: 12.8 g/dL (ref 12.0–15.0)
Immature Granulocytes: 0 %
Lymphocytes Relative: 31 %
Lymphs Abs: 0.8 10*3/uL (ref 0.7–4.0)
MCH: 30.4 pg (ref 26.0–34.0)
MCHC: 35.2 g/dL (ref 30.0–36.0)
MCV: 86.5 fL (ref 80.0–100.0)
Monocytes Absolute: 0.3 10*3/uL (ref 0.1–1.0)
Monocytes Relative: 11 %
Neutro Abs: 1.5 10*3/uL — ABNORMAL LOW (ref 1.7–7.7)
Neutrophils Relative %: 55 %
Platelets: 147 10*3/uL — ABNORMAL LOW (ref 150–400)
RBC: 4.21 MIL/uL (ref 3.87–5.11)
RDW: 12.4 % (ref 11.5–15.5)
WBC: 2.7 10*3/uL — ABNORMAL LOW (ref 4.0–10.5)
nRBC: 0 % (ref 0.0–0.2)

## 2021-10-08 LAB — COMPREHENSIVE METABOLIC PANEL
ALT: 19 U/L (ref 0–44)
AST: 15 U/L (ref 15–41)
Albumin: 4.3 g/dL (ref 3.5–5.0)
Alkaline Phosphatase: 40 U/L (ref 38–126)
Anion gap: 7 (ref 5–15)
BUN: 12 mg/dL (ref 8–23)
CO2: 29 mmol/L (ref 22–32)
Calcium: 9.6 mg/dL (ref 8.9–10.3)
Chloride: 105 mmol/L (ref 98–111)
Creatinine, Ser: 0.74 mg/dL (ref 0.44–1.00)
GFR, Estimated: >60 mL/min (ref >60)
Glucose, Bld: 116 mg/dL — ABNORMAL HIGH (ref 70–99)
Potassium: 3.3 mmol/L — ABNORMAL LOW (ref 3.5–5.1)
Sodium: 141 mmol/L (ref 135–145)
Total Bilirubin: 1.1 mg/dL (ref 0.3–1.2)
Total Protein: 6.7 g/dL (ref 6.5–8.1)

## 2021-10-08 MED ORDER — POTASSIUM CHLORIDE CRYS ER 20 MEQ PO TBCR: EXTENDED_RELEASE_TABLET | ORAL | 3 refills | Status: DC

## 2021-10-08 MED ORDER — TAMOXIFEN CITRATE 20 MG PO TABS: 20.0000 mg | ORAL_TABLET | Freq: Every day | ORAL | 3 refills | Status: DC

## 2021-10-09 ENCOUNTER — Telehealth: Payer: Self-pay | Admitting: Hematology

## 2021-10-09 NOTE — Telephone Encounter (Signed)
Left message with follow-up appointment per 6/12 los.

## 2021-11-28 DIAGNOSIS — Z1159 Encounter for screening for other viral diseases: Secondary | ICD-10-CM | POA: Diagnosis not present

## 2021-11-28 DIAGNOSIS — I10 Essential (primary) hypertension: Secondary | ICD-10-CM | POA: Diagnosis not present

## 2021-11-28 DIAGNOSIS — E782 Mixed hyperlipidemia: Secondary | ICD-10-CM | POA: Diagnosis not present

## 2021-11-28 DIAGNOSIS — Z0000 Encounter for general adult medical examination without abnormal findings: Secondary | ICD-10-CM | POA: Diagnosis not present

## 2022-01-07 DIAGNOSIS — H524 Presbyopia: Secondary | ICD-10-CM | POA: Diagnosis not present

## 2022-01-07 DIAGNOSIS — H5213 Myopia, bilateral: Secondary | ICD-10-CM | POA: Diagnosis not present

## 2022-01-07 DIAGNOSIS — H52223 Regular astigmatism, bilateral: Secondary | ICD-10-CM | POA: Diagnosis not present

## 2022-01-07 DIAGNOSIS — H40023 Open angle with borderline findings, high risk, bilateral: Secondary | ICD-10-CM | POA: Diagnosis not present

## 2022-01-07 DIAGNOSIS — H25813 Combined forms of age-related cataract, bilateral: Secondary | ICD-10-CM | POA: Diagnosis not present

## 2022-01-10 ENCOUNTER — Other Ambulatory Visit: Payer: Self-pay | Admitting: Hematology

## 2022-01-10 ENCOUNTER — Telehealth: Payer: Self-pay

## 2022-01-10 MED ORDER — TAMOXIFEN CITRATE 10 MG PO TABS
5.0000 mg | ORAL_TABLET | Freq: Two times a day (BID) | ORAL | 1 refills | Status: DC
Start: 2022-01-10 — End: 2022-04-03

## 2022-01-10 NOTE — Telephone Encounter (Signed)
Pt called stating she's having joint pain from the Tamoxifen.  Pt is wanting to know if there is something else she can take other than Tamoxifen.  Notified Dr. Burr Medico of the pt's call.

## 2022-01-14 ENCOUNTER — Other Ambulatory Visit: Payer: Self-pay

## 2022-01-14 NOTE — Progress Notes (Signed)
Per Pt Call Request message from Dr. Burr Medico:  I called her back and left her a message to reduce tamoxifen dose to 5 mg daily.  Okay to stop her tamoxifen 20 mg daily now for 1 month break, before she start the low-dose.  Prescription was called in today.  I encouraged her to call us back to confirm that she received this message and if she has any questions.   Truitt Merle   01/10/2022

## 2022-04-02 ENCOUNTER — Other Ambulatory Visit: Payer: Self-pay | Admitting: Hematology

## 2022-04-07 NOTE — Progress Notes (Unsigned)
Victoria Fuller   [phone] [phone]   Clinic Follow up Note   Patient Care Team: Donald Prose, MD as PCP - General (Family Medicine) Stark Klein, MD as Consulting Physician (General Surgery) Truitt Merle, MD as Consulting Physician (Hematology) Kyung Rudd, MD as Consulting Physician (Radiation Oncology) Delice Bison, Charlestine Massed, NP as Nurse Practitioner (Hematology and Oncology) Alla Feeling, NP as Nurse Practitioner (Nurse Practitioner) 04/09/2022  CHIEF COMPLAINT: Follow up h/o bilateral DCIS  SUMMARY OF ONCOLOGIC HISTORY: Oncology History Overview Note  Cancer Staging Ductal carcinoma in situ (DCIS) of left breast Staging form: Breast, AJCC 8th Edition - Clinical stage from 08/25/2017: Stage 0 (cTis (DCIS), cN0, cM0, ER: Unknown, PR: Unknown, HER2: Not Assessed) - Signed by Truitt Merle, MD on 09/03/2017 Nuclear grade: G2 Laterality: Left - Pathologic: Stage 0 (pTis (DCIS), pN0, cM0, ER+, PR+) - Unsigned  Ductal carcinoma in situ (DCIS) of right breast Staging form: Breast, AJCC 8th Edition - Clinical stage from 10/06/2019: Stage 0 (cTis (DCIS), cN0, cM0, ER+, PR+, HER2: Not Assessed) - Signed by Alla Feeling, NP on 06/12/2020 Stage prefix: Initial diagnosis Nuclear grade: G2 - Pathologic stage from 06/12/2020: Stage 0 (pTis (DCIS), pN0, cM0, ER+, PR+, HER2: Not Assessed) - Signed by Alla Feeling, NP on 06/12/2020 Nuclear grade: G2     Ductal carcinoma in situ (DCIS) of left breast  08/21/2017 Mammogram   IMPRESSION: New grouped pleomorphic calcifications within the upper-outer quadrant of the LEFT breast, spanning 4 mm extent. This is a suspicious finding for which stereotactic biopsy is recommended.   08/25/2017 Initial Biopsy   Diagnosis 08/25/17 Breast, left, needle core biopsy, upper outer quadrant coil clip - DUCTAL CARCINOMA IN SITU WITH CALCIFICATIONS, INTERMEDIATE GRADE. - DUCTAL PAPILLOMA. - SEE MICROSCOPIC DESCRIPTION.    08/25/2017 Cancer Staging   Staging form: Breast, AJCC 8th Edition - Clinical stage from 08/25/2017: Stage 0 (cTis (DCIS), cN0, cM0, ER: Unknown, PR: Unknown, HER2: Not Assessed) - Signed by Truitt Merle, MD on 09/03/2017   08/29/2017 Initial Diagnosis   Ductal carcinoma in situ (DCIS) of left breast   09/10/2017 Surgery   left BREAST LUMPECTOMY WITH RADIOACTIVE SEED LOCALIZATION by Stark Klein, MD   09/10/2017 Pathology Results   09/10/2017 Surgical Pathology Diagnosis Breast, lumpectomy - DUCTAL CARCINOMA IN SITU, INTERMEDIATE NUCLEAR GRADE, WITH CALCIFICATIONS - MARGINS UNINVOLVED BY CARCINOMA - DUCT ECTASIA AND PERIDUCTULAR CHRONIC INFLAMMATION - FIBROADENOMATOID NODULE - PREVIOUS BIOPSY SITE CHANGES - SEE ONCOLOGY TABLE BELOW     10/08/2017 - 11/06/2017 Radiation Therapy   The patient initially received a dose of 42.56 Gy in 16 fractions to the breast using whole-breast tangent fields. This was delivered using a 3-D conformal technique. The patient then received a boost to the seroma. This delivered an additional 8 Gy in 4 fractions using a 3 field photon technique due to the depth of the seroma. The total dose was 50.56 Gy.    Anti-estrogen oral therapy   She declined anti-estrogen therapy due to concerns with side effects.    Ductal carcinoma in situ (DCIS) of right breast  09/29/2019 Mammogram   Diagnostic Mammogram on 09/29/19  IMPRESSION: Suspicious calcifications spanning 68m in the 12 o'clock region of the right breast.   10/06/2019 Initial Biopsy   Diagnosis Breast, right, needle core biopsy, upper central, coil clip - DUCTAL CARCINOMA IN SITU WITH CALCIFICATIONS, INTERMEDIATE NUCLEAR GRADE. - SEE MICROSCOPIC DESCRIPTION. Microscopic Comment Estrogen and progesterone receptors will be performed. PROGNOSTIC INDICATOR RESULTS: Immunohistochemical and morphometric analysis  performed manually Estrogen Receptor: 100%, STRONG STAINING INTESITY Progesterone Receptor: 80%, STRONG  STAINING INTESITY   10/06/2019 Cancer Staging   Staging form: Breast, AJCC 8th Edition - Clinical stage from 10/06/2019: Stage 0 (cTis (DCIS), cN0, cM0, ER+, PR+, HER2: Not Assessed) - Signed by Alla Feeling, NP on 06/12/2020 Stage prefix: Initial diagnosis Nuclear grade: G2   10/13/2019 Initial Diagnosis   Ductal carcinoma in situ (DCIS) of right breast   11/18/2019 Surgery   RIGHT BREAST LUMPECTOMY WITH RADIOACTIVE SEED LOCALIZATION with Dr Barry Dienes    11/18/2019 Pathology Results   FINAL MICROSCOPIC DIAGNOSIS:   A. BREAST, RIGHT, LUMPECTOMY:  - Ductal carcinoma in situ with calcifications, intermediate grade with  focal necrosis, 1.3 cm  - No evidence of invasive carcinoma  - DCIS focally involves the lateral margin and is 0.3 cm from posterior  margin  - Atypical ductal hyperplasia, see comment  - Biopsy site changes  - See oncology table    COMMENT:   The small focus of ADH has a dissimilar appearance from rest of the DCIS  and is about 1 mm from the posterior margin.    12/22/2019 Surgery   RIGHT BREAST RE-EXCISION OF BREAST LUMPECTOMY with Dr Barry Dienes   12/22/2019 Pathology Results   FINAL MICROSCOPIC DIAGNOSIS:   A. BREAST, RIGHT, LATERAL MARGIN, RE-EXCISION:  - Resection site changes.  - Fibrocystic change.  - No malignancy identified.    01/24/2020 - 02/18/2020 Radiation Therapy   Adjuvant Radiation with Dr Lisbeth Renshaw    02/2020 -  Anti-estrogen oral therapy   Tamoxifen 91m starting 02/2020, will start at 161mand if tolerable will increase 2061m   06/12/2020 Survivorship   SCP reviewed virtually by LacCira RueP. To be mailed to patient after visit    06/12/2020 Cancer Staging   Staging form: Breast, AJCC 8th Edition - Pathologic stage from 06/12/2020: Stage 0 (pTis (DCIS), pN0, cM0, ER+, PR+, HER2: Not Assessed) - Signed by BurAlla FeelingP on 06/12/2020 Nuclear grade: G2     CURRENT THERAPY: Tamoxifen starting 20 mg starting 02/2020 initially began 10 mg  and increased to 20 mg   INTERVAL HISTORY: Victoria Fuller for follow up as scheduled, last seen by me 10/08/21.  Is doing well without changes in her health.  She does have right hip discomfort that has gradually increased over the past year, worse especially with prolonged sitting or stairs, and cooler weather and when it rains.  Pain improves with movement, heat, and NSAIDs.  Aches improved with lower dose tamoxifen.  Denies any other bone pain, hot flashes.  She notes "lymph fluid" in the right breast that improves with physical therapy exercises she does at home.  Denies any other new lump/mass, nipple discharge or inversion, or skin change.  Up-to-date on her age-appropriate care.  Denies recurrent or severe infection.  All other systems were reviewed with the patient and are negative.  MEDICAL HISTORY:  Past Medical History:  Diagnosis Date   [facility]   Left breast cancer   Hyperlipidemia    Hypertension    Personal history of radiation therapy    left and right breasts   Wears contact lenses     SURGICAL HISTORY: Past Surgical History:  Procedure Laterality Date   ABDOMINAL HYSTERECTOMY     BREAST BIOPSY Left 08/25/2017   BREAST BIOPSY Right 10/06/2019   BREAST LUMPECTOMY Left 08/31/2017   BREAST LUMPECTOMY Right 11/18/2019   BREAST LUMPECTOMY WITH RADIOACTIVE SEED LOCALIZATION Left  09/10/2017   Procedure: BREAST LUMPECTOMY WITH RADIOACTIVE SEED LOCALIZATION;  Surgeon: Stark Klein, MD;  Location: [place];  Service: General;  Laterality: Left;   BREAST LUMPECTOMY WITH RADIOACTIVE SEED LOCALIZATION Right 11/18/2019   Procedure: RIGHT BREAST LUMPECTOMY WITH RADIOACTIVE SEED LOCALIZATION;  Surgeon: Stark Klein, MD;  Location: [place];  Service: General;  Laterality: Right;   COLONOSCOPY W/ BIOPSIES AND POLYPECTOMY     DIAGNOSTIC LAPAROSCOPY     uterine polyps   RE-EXCISION OF BREAST LUMPECTOMY Right 12/22/2019   Procedure: RIGHT BREAST  RE-EXCISION OF BREAST LUMPECTOMY;  Surgeon: Stark Klein, MD;  Location: [place];  Service: General;  Laterality: Right;   WISDOM TOOTH EXTRACTION      I have reviewed the social history and family history with the patient and they are unchanged from previous note.  ALLERGIES:  has No Known Allergies.  MEDICATIONS:  Current Outpatient Medications  Medication Sig Dispense Refill   amLODipine (NORVASC) 5 MG tablet Take 5 mg by mouth daily.     aspirin EC 81 MG tablet Take 81 mg by mouth 3 (three) times a week. Swallow whole.     atorvastatin (LIPITOR) 10 MG tablet Take 10 mg by mouth every other day.      hydrochlorothiazide (MICROZIDE) 12.5 MG capsule Take 12.5 mg by mouth daily.     Multiple Vitamin (MULTIVITAMIN WITH MINERALS) TABS tablet Take 1 tablet by mouth daily.     potassium chloride SA (KLOR-CON M) 20 MEQ tablet Take 1 tablet twice daily for 5 days then once daily 30 tablet 3   tamoxifen (NOLVADEX) 10 MG tablet Take 1/2 (one-half) tablet by mouth twice daily 90 tablet 1   No current facility-administered medications for this visit.    PHYSICAL EXAMINATION: ECOG PERFORMANCE STATUS: 1 - Symptomatic but completely ambulatory  Vitals:   04/09/22 0952 04/09/22 1008  BP: (!) 144/82 124/78  Pulse: 75   Resp: 15   Temp: 98.7 F (37.1 C)   SpO2: 100%    Filed Weights   04/09/22 0952  Weight: 181 lb 11.2 oz (82.4 kg)    GENERAL:alert, no distress and comfortable SKIN: No rash.  Birthmark to left elbow/forearm.  Scattered areas of hyperpigmentation to lower extremities EYES:  sclera clear NECK: Without mass LYMPH:  no palpable cervical or supraclavicular lymphadenopathy  LUNGS:  normal breathing effort HEART: no lower extremity edema ABDOMEN:abdomen soft, non-tender and normal bowel sounds Musculoskeletal: No focal right hip or spinal tenderness NEURO: alert & oriented x 3 with fluent speech, no focal motor/sensory deficits Breast exam: Symmetrical without nipple  discharge or inversion.  S/p bilateral lumpectomy, incisions completely healed with scar tissue.  No palpable mass or nodularity in either breast or axilla that I could appreciate.  LABORATORY DATA:  I have reviewed the data as listed    Latest Ref Rng & Units 04/09/2022    9:37 AM 10/08/2021   10:07 AM 04/16/2021   10:09 AM  CBC  WBC 4.0 - 10.5 K/uL 2.8  2.7  2.8   Hemoglobin 12.0 - 15.0 g/dL 12.7  12.8  12.9   Hematocrit 36.0 - 46.0 % 36.9  36.4  37.9   Platelets 150 - 400 K/uL 156  147  159         Latest Ref Rng & Units 04/09/2022    9:37 AM 10/08/2021   10:07 AM 04/16/2021   10:09 AM  CMP  Glucose 70 - 99 mg/dL 103  116  110  BUN 8 - 23 mg/dL _0 Creatinine 0.44 - 1.00 mg/dL 0.57  0.74  0.72   Sodium 135 - 145 mmol/L 142  141  142   Potassium 3.5 - 5.1 mmol/L 3.6  3.3  3.5   Chloride 98 - 111 mmol/L 106  105  106   CO2 22 - 32 mmol/L 32  29  29   Calcium 8.9 - 10.3 mg/dL 9.5  9.6  9.1   Total Protein 6.5 - 8.1 g/dL 6.2  6.7  6.7   Total Bilirubin 0.3 - 1.2 mg/dL 0.9  1.1  1.0   Alkaline Phos 38 - 126 U/L 46  40  47   AST 15 - 41 U/L _1 ALT 0 - 44 U/L _2 RADIOGRAPHIC STUDIES: I have personally reviewed the radiological images as listed and agreed with the findings in the report. No results found.   ASSESSMENT & PLAN: Victoria Fuller is a 71 y.o. female with    1. Ductal Carcinoma in situ (DCIS) of Right breast, ER/PR+, Intermediate grade -Diagnosed in 09/2019. S/p right breast lumpectomy on 11/18/19 and re-excision on 12/22/19 with Dr Barry Dienes and no further malignancy was found. she completed adjuvant radiation with Dr Lisbeth Renshaw on 01/24/20-02/18/20.  -She previously declined genetic testing.  -She started adjuvant Tamoxifen on 02/28/20, Plan for 5 years. tolerating 10 mg much better, less joint pain -Victoria Fuller is clinically doing well. Tolerating tamoxifen with minimal side effects. Exam is benign, labs are stable. Mammogram 09/2021 was  negative. Overall no clinical concern for recurrence.  -Continue breast cancer surveillance and Tamoxifen. We discussed the role of screening breast MRI, she declined. -F/up in 6 months, or sooner if needed   2. 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 Stage 0 grade 2, ER+/PR+, G2 -Diagnosed in 07/2017, S/p left lumpectomy and adjuvant radiation.  -At the time she declined chemoprevention with antiestrogen therapy.   3. Skin hyperpigmentation -She has scattered hyperpigmentation to the lower legs, began after starting tamoxifen -No new eruptions but existing areas have not resolved; no change on lower dose tamoxifen 10 mg -We previously discussed dermatology referral, she prefers to monitor for now   4. Leukopenia, thrombocytopenia  -She had mild leukopenia since at least 2019, WBC 3.4 -CBC 11/09/2019 shows WBC 2.9, platelet 143, no anemia -She continues to have a mild leukopenia/neutropenia and intermittent thrombocytopenia. -Denies history of chronic/recurrent infection, liver disease, spleen issue -She takes multivitamin sporadically -the etiology is unclear, ?tamoxifen, nutritional, or other such as benign ethnic neutropenia.   -Labs today show normal hgb and plt, mild low ANC 1.5. I encouraged her to stay up to date on vaccines. Will check additional labs next visit    5. Hypertension and dyslipidemia, weight gain -Continue medication and follow-up with primary care physician -weight is stable   PLAN: -Labs reviewed -Continue breast cancer surveillance and Tamoxifen 10 mg daily (she takes 5 mg BID) -Mammogram in 09/2022 -Neutropenic precautions -F/up in 6 months, or sooner if needed   Orders Placed This Encounter  Procedures   MM DIAG BREAST TOMO BILATERAL    Standing Status:   Future    Standing Expiration Date:   04/10/2023    Order Specific Question:   Reason for Exam (SYMPTOM  OR DIAGNOSIS REQUIRED)    Answer:   h/o bilateral DCIS and  lumpectomies  Order Specific Question:   Preferred imaging location?    Answer:   [facility]   Folate RBC    Standing Status:   Standing    Number of Occurrences:   1    Standing Expiration Date:   04/10/2023   Vitamin B12    Standing Status:   Standing    Number of Occurrences:   1    Standing Expiration Date:   04/10/2023   Methylmalonic acid, serum    Standing Status:   Standing    Number of Occurrences:   1    Standing Expiration Date:   04/10/2023   All questions were answered. The patient knows to call the clinic with any problems, questions or concerns. No barriers to learning was detected. I spent 20 minutes counseling the patient face to face. The total time spent in the appointment was 30 minutes and more than 50% was on counseling and review of test results.     Alla Feeling, NP 04/09/22

## 2022-04-09 ENCOUNTER — Inpatient Hospital Stay: Payer: Medicare Other

## 2022-04-09 ENCOUNTER — Other Ambulatory Visit: Payer: Self-pay

## 2022-04-09 ENCOUNTER — Encounter: Payer: Self-pay | Admitting: Nurse Practitioner

## 2022-04-09 ENCOUNTER — Inpatient Hospital Stay: Payer: Medicare Other | Attending: Nurse Practitioner | Admitting: Nurse Practitioner

## 2022-04-09 VITALS — BP 124/78 | HR 75 | Temp 98.7°F | Resp 15 | Wt 181.7 lb

## 2022-04-09 DIAGNOSIS — I10 Essential (primary) hypertension: Secondary | ICD-10-CM | POA: Insufficient documentation

## 2022-04-09 DIAGNOSIS — Z923 Personal history of irradiation: Secondary | ICD-10-CM | POA: Insufficient documentation

## 2022-04-09 DIAGNOSIS — E785 Hyperlipidemia, unspecified: Secondary | ICD-10-CM | POA: Diagnosis not present

## 2022-04-09 DIAGNOSIS — D0511 Intraductal carcinoma in situ of right breast: Secondary | ICD-10-CM | POA: Insufficient documentation

## 2022-04-09 DIAGNOSIS — D72819 Decreased white blood cell count, unspecified: Secondary | ICD-10-CM | POA: Insufficient documentation

## 2022-04-09 DIAGNOSIS — D709 Neutropenia, unspecified: Secondary | ICD-10-CM | POA: Diagnosis not present

## 2022-04-09 DIAGNOSIS — D0512 Intraductal carcinoma in situ of left breast: Secondary | ICD-10-CM

## 2022-04-09 DIAGNOSIS — D696 Thrombocytopenia, unspecified: Secondary | ICD-10-CM | POA: Insufficient documentation

## 2022-04-09 DIAGNOSIS — L819 Disorder of pigmentation, unspecified: Secondary | ICD-10-CM | POA: Diagnosis not present

## 2022-04-09 DIAGNOSIS — R635 Abnormal weight gain: Secondary | ICD-10-CM | POA: Diagnosis not present

## 2022-04-09 DIAGNOSIS — Z79899 Other long term (current) drug therapy: Secondary | ICD-10-CM | POA: Diagnosis not present

## 2022-04-09 DIAGNOSIS — Z170 Estrogen receptor positive status [ER+]: Secondary | ICD-10-CM | POA: Diagnosis not present

## 2022-04-09 LAB — COMPREHENSIVE METABOLIC PANEL
ALT: 18 U/L (ref 0–44)
AST: 16 U/L (ref 15–41)
Albumin: 4.1 g/dL (ref 3.5–5.0)
Alkaline Phosphatase: 46 U/L (ref 38–126)
Anion gap: 4 — ABNORMAL LOW (ref 5–15)
BUN: 9 mg/dL (ref 8–23)
CO2: 32 mmol/L (ref 22–32)
Calcium: 9.5 mg/dL (ref 8.9–10.3)
Chloride: 106 mmol/L (ref 98–111)
Creatinine, Ser: 0.57 mg/dL (ref 0.44–1.00)
GFR, Estimated: >60 mL/min (ref >60)
Glucose, Bld: 103 mg/dL — ABNORMAL HIGH (ref 70–99)
Potassium: 3.6 mmol/L (ref 3.5–5.1)
Sodium: 142 mmol/L (ref 135–145)
Total Bilirubin: 0.9 mg/dL (ref 0.3–1.2)
Total Protein: 6.2 g/dL — ABNORMAL LOW (ref 6.5–8.1)

## 2022-04-09 LAB — CBC WITH DIFFERENTIAL/PLATELET
Abs Immature Granulocytes: 0 10*3/uL (ref 0.00–0.07)
Basophils Absolute: 0 10*3/uL (ref 0.0–0.1)
Basophils Relative: 1 %
Eosinophils Absolute: 0.1 10*3/uL (ref 0.0–0.5)
Eosinophils Relative: 4 %
HCT: 36.9 % (ref 36.0–46.0)
Hemoglobin: 12.7 g/dL (ref 12.0–15.0)
Immature Granulocytes: 0 %
Lymphocytes Relative: 31 %
Lymphs Abs: 0.9 10*3/uL (ref 0.7–4.0)
MCH: 30 pg (ref 26.0–34.0)
MCHC: 34.4 g/dL (ref 30.0–36.0)
MCV: 87.2 fL (ref 80.0–100.0)
Monocytes Absolute: 0.3 10*3/uL (ref 0.1–1.0)
Monocytes Relative: 11 %
Neutro Abs: 1.5 10*3/uL — ABNORMAL LOW (ref 1.7–7.7)
Neutrophils Relative %: 53 %
Platelets: 156 10*3/uL (ref 150–400)
RBC: 4.23 MIL/uL (ref 3.87–5.11)
RDW: 12.3 % (ref 11.5–15.5)
WBC: 2.8 10*3/uL — ABNORMAL LOW (ref 4.0–10.5)
nRBC: 0 % (ref 0.0–0.2)

## 2022-04-09 MED ORDER — TAMOXIFEN CITRATE 10 MG PO TABS: ORAL_TABLET | ORAL | 1 refills | Status: DC

## 2022-04-10 ENCOUNTER — Telehealth: Payer: Self-pay | Admitting: Hematology

## 2022-04-10 NOTE — Telephone Encounter (Signed)
Left patient a vm regarding upcoming appointments  

## 2022-05-31 DIAGNOSIS — M25551 Pain in right hip: Secondary | ICD-10-CM | POA: Diagnosis not present

## 2022-05-31 DIAGNOSIS — E782 Mixed hyperlipidemia: Secondary | ICD-10-CM | POA: Diagnosis not present

## 2022-05-31 DIAGNOSIS — I10 Essential (primary) hypertension: Secondary | ICD-10-CM | POA: Diagnosis not present

## 2022-06-06 DIAGNOSIS — M25551 Pain in right hip: Secondary | ICD-10-CM | POA: Diagnosis not present

## 2022-07-01 DIAGNOSIS — M25551 Pain in right hip: Secondary | ICD-10-CM | POA: Diagnosis not present

## 2022-10-07 ENCOUNTER — Ambulatory Visit
Admission: RE | Admit: 2022-10-07 | Discharge: 2022-10-07 | Disposition: A | Discharge status: Home / Self Care | Payer: Medicare Other | Source: Ambulatory Visit | Attending: Nurse Practitioner | Admitting: Nurse Practitioner

## 2022-10-07 DIAGNOSIS — D0512 Intraductal carcinoma in situ of left breast: Secondary | ICD-10-CM

## 2022-10-07 DIAGNOSIS — R92323 Mammographic fibroglandular density, bilateral breasts: Secondary | ICD-10-CM | POA: Diagnosis not present

## 2022-10-07 DIAGNOSIS — D0511 Intraductal carcinoma in situ of right breast: Secondary | ICD-10-CM

## 2022-10-07 DIAGNOSIS — Z853 Personal history of malignant neoplasm of breast: Secondary | ICD-10-CM | POA: Diagnosis not present

## 2022-10-08 ENCOUNTER — Other Ambulatory Visit: Payer: Self-pay

## 2022-10-08 DIAGNOSIS — D0512 Intraductal carcinoma in situ of left breast: Secondary | ICD-10-CM

## 2022-10-08 DIAGNOSIS — D0511 Intraductal carcinoma in situ of right breast: Secondary | ICD-10-CM

## 2022-10-08 NOTE — Progress Notes (Unsigned)
[facility]   [phone] [phone]   Clinic Follow up Note   Patient Care Team: Deatra James, MD as PCP - General (Family Medicine) Almond Lint, MD as Consulting Physician (General Surgery) Malachy Mood, MD as Consulting Physician (Hematology) Dorothy Puffer, MD as Consulting Physician (Radiation Oncology) Axel Filler, Larna Daughters, NP as Nurse Practitioner (Hematology and Oncology) Pollyann Samples, NP as Nurse Practitioner (Nurse Practitioner)  Date of Service:  10/09/2022  CHIEF COMPLAINT: f/u of h/o bilateral DCIS   CURRENT THERAPY:  Tamoxifen starting 20 mg starting 02/2020 initially began 10 mg and increased to 20 mg     ASSESSMENT:  Victoria Fuller is a 72 y.o. female with   Ductal carcinoma in situ (DCIS) of right breast Diagnosed in 09/2019. S/p right breast lumpectomy on 11/18/19 and re-excision on 12/22/19 with Dr Donell Beers and no further malignancy was found. she completed adjuvant radiation with Dr Mitzi Hansen on 01/24/20-02/18/20.  -She previously declined genetic testing.  -She started adjuvant Tamoxifen on 02/28/20, Plan for 3-5 years. tolerating 10 mg much better, less joint pain. We discussed the data from TAM-01 trail data, 3 years therapy is probably adequate  -She is clinically doing well, breast exam was unremarkable except scar tissues, mammogram from 10/07/2022 was normal.  No clinical concern for breast cancer. -Continue breast cancer surveillance and Tamoxifen. We previously discussed the role of screening breast MRI, she declined. -F/up in 6 months, or sooner if needed  Ductal carcinoma in situ (DCIS) of left breast -Diagnosed in 07/2017, S/p left lumpectomy and adjuvant radiation.  -At the time she declined chemoprevention with antiestrogen therapy.       PLAN: -lab reviewed, WBC 3.2 -Continue Tamoxifen til November 2024 - recommend Breast MR in addition to mammogram, she will think about it  -I refill Tamoxifen -lab and f/u in 6  months, f/u as needed after next visit    SUMMARY OF ONCOLOGIC HISTORY: Oncology History Overview Note  Cancer Staging Ductal carcinoma in situ (DCIS) of left breast Staging form: Breast, AJCC 8th Edition - Clinical stage from 08/25/2017: Stage 0 (cTis (DCIS), cN0, cM0, ER: Unknown, PR: Unknown, HER2: Not Assessed) - Signed by Malachy Mood, MD on 09/03/2017 Nuclear grade: G2 Laterality: Left - Pathologic: Stage 0 (pTis (DCIS), pN0, cM0, ER+, PR+) - Unsigned  Ductal carcinoma in situ (DCIS) of right breast Staging form: Breast, AJCC 8th Edition - Clinical stage from 10/06/2019: Stage 0 (cTis (DCIS), cN0, cM0, ER+, PR+, HER2: Not Assessed) - Signed by Pollyann Samples, NP on 06/12/2020 Stage prefix: Initial diagnosis Nuclear grade: G2 - Pathologic stage from 06/12/2020: Stage 0 (pTis (DCIS), pN0, cM0, ER+, PR+, HER2: Not Assessed) - Signed by Pollyann Samples, NP on 06/12/2020 Nuclear grade: G2     Ductal carcinoma in situ (DCIS) of left breast  08/21/2017 Mammogram   IMPRESSION: New grouped pleomorphic calcifications within the upper-outer quadrant of the LEFT breast, spanning 4 mm extent. This is a suspicious finding for which stereotactic biopsy is recommended.   08/25/2017 Initial Biopsy   Diagnosis 08/25/17 Breast, left, needle core biopsy, upper outer quadrant coil clip - DUCTAL CARCINOMA IN SITU WITH CALCIFICATIONS, INTERMEDIATE GRADE. - DUCTAL PAPILLOMA. - SEE MICROSCOPIC DESCRIPTION.   08/25/2017 Cancer Staging   Staging form: Breast, AJCC 8th Edition - Clinical stage from 08/25/2017: Stage 0 (cTis (DCIS), cN0, cM0, ER: Unknown, PR: Unknown, HER2: Not Assessed) - Signed by Malachy Mood, MD on 09/03/2017   08/29/2017 Initial Diagnosis   Ductal carcinoma in  situ (DCIS) of left breast   09/10/2017 Surgery   left BREAST LUMPECTOMY WITH RADIOACTIVE SEED LOCALIZATION by Almond Lint, MD   09/10/2017 Pathology Results   09/10/2017 Surgical Pathology Diagnosis Breast, lumpectomy - DUCTAL  CARCINOMA IN SITU, INTERMEDIATE NUCLEAR GRADE, WITH CALCIFICATIONS - MARGINS UNINVOLVED BY CARCINOMA - DUCT ECTASIA AND PERIDUCTULAR CHRONIC INFLAMMATION - FIBROADENOMATOID NODULE - PREVIOUS BIOPSY SITE CHANGES - SEE ONCOLOGY TABLE BELOW     10/08/2017 - 11/06/2017 Radiation Therapy   The patient initially received a dose of 42.56 Gy in 16 fractions to the breast using whole-breast tangent fields. This was delivered using a 3-D conformal technique. The patient then received a boost to the seroma. This delivered an additional 8 Gy in 4 fractions using a 3 field photon technique due to the depth of the seroma. The total dose was 50.56 Gy.    Anti-estrogen oral therapy   She declined anti-estrogen therapy due to concerns with side effects.    Ductal carcinoma in situ (DCIS) of right breast  09/29/2019 Mammogram   Diagnostic Mammogram on 09/29/19  IMPRESSION: Suspicious calcifications spanning 7mm in the 12 o'clock region of the right breast.   10/06/2019 Initial Biopsy   Diagnosis Breast, right, needle core biopsy, upper central, coil clip - DUCTAL CARCINOMA IN SITU WITH CALCIFICATIONS, INTERMEDIATE NUCLEAR GRADE. - SEE MICROSCOPIC DESCRIPTION. Microscopic Comment Estrogen and progesterone receptors will be performed. PROGNOSTIC INDICATOR RESULTS: Immunohistochemical and morphometric analysis performed manually Estrogen Receptor: 100%, STRONG STAINING INTESITY Progesterone Receptor: 80%, STRONG STAINING INTESITY   10/06/2019 Cancer Staging   Staging form: Breast, AJCC 8th Edition - Clinical stage from 10/06/2019: Stage 0 (cTis (DCIS), cN0, cM0, ER+, PR+, HER2: Not Assessed) - Signed by Pollyann Samples, NP on 06/12/2020 Stage prefix: Initial diagnosis Nuclear grade: G2   10/13/2019 Initial Diagnosis   Ductal carcinoma in situ (DCIS) of right breast   11/18/2019 Surgery   RIGHT BREAST LUMPECTOMY WITH RADIOACTIVE SEED LOCALIZATION with Dr Donell Beers    11/18/2019 Pathology Results   FINAL  MICROSCOPIC DIAGNOSIS:   A. BREAST, RIGHT, LUMPECTOMY:  - Ductal carcinoma in situ with calcifications, intermediate grade with  focal necrosis, 1.3 cm  - No evidence of invasive carcinoma  - DCIS focally involves the lateral margin and is 0.3 cm from posterior  margin  - Atypical ductal hyperplasia, see comment  - Biopsy site changes  - See oncology table    COMMENT:   The small focus of ADH has a dissimilar appearance from rest of the DCIS  and is about 1 mm from the posterior margin.    12/22/2019 Surgery   RIGHT BREAST RE-EXCISION OF BREAST LUMPECTOMY with Dr Donell Beers   12/22/2019 Pathology Results   FINAL MICROSCOPIC DIAGNOSIS:   A. BREAST, RIGHT, LATERAL MARGIN, RE-EXCISION:  - Resection site changes.  - Fibrocystic change.  - No malignancy identified.    01/24/2020 - 02/18/2020 Radiation Therapy   Adjuvant Radiation with Dr Mitzi Hansen    02/2020 -  Anti-estrogen oral therapy   Tamoxifen 20mg  starting 02/2020, will start at 10mg  and if tolerable will increase 20mg .    06/12/2020 Survivorship   SCP reviewed virtually by Santiago Glad, NP. To be mailed to patient after visit    06/12/2020 Cancer Staging   Staging form: Breast, AJCC 8th Edition - Pathologic stage from 06/12/2020: Stage 0 (pTis (DCIS), pN0, cM0, ER+, PR+, HER2: Not Assessed) - Signed by Pollyann Samples, NP on 06/12/2020 Nuclear grade: G2      INTERVAL HISTORY:  Bonita Quin  Victoria Fuller is here for a follow up of h/o bilateral DCIS  She was last seen by NP Lacie on 04/09/2022. She presents to the clinic alone.Pt state that she has doing good . She reports no skin issues since taking Tamoxifen at reduce dose, but has some joint pain. Pt state that she does some walking for exercise.      All other systems were reviewed with the patient and are negative.  MEDICAL HISTORY:  Past Medical History:  Diagnosis Date   Cancer (HCC) 08/2017   Left breast cancer   Hyperlipidemia    Hypertension    Personal history of  radiation therapy    left and right breasts   Wears contact lenses     SURGICAL HISTORY: Past Surgical History:  Procedure Laterality Date   ABDOMINAL HYSTERECTOMY     BREAST BIOPSY Left 08/25/2017   BREAST BIOPSY Right 10/06/2019   BREAST LUMPECTOMY Left 08/31/2017   BREAST LUMPECTOMY Right 11/18/2019   BREAST LUMPECTOMY WITH RADIOACTIVE SEED LOCALIZATION Left 09/10/2017   Procedure: BREAST LUMPECTOMY WITH RADIOACTIVE SEED LOCALIZATION;  Surgeon: Almond Lint, MD;  Location: [facility];  Service: General;  Laterality: Left;   BREAST LUMPECTOMY WITH RADIOACTIVE SEED LOCALIZATION Right 11/18/2019   Procedure: RIGHT BREAST LUMPECTOMY WITH RADIOACTIVE SEED LOCALIZATION;  Surgeon: Almond Lint, MD;  Location: [facility] OR;  Service: General;  Laterality: Right;   COLONOSCOPY W/ BIOPSIES AND POLYPECTOMY     DIAGNOSTIC LAPAROSCOPY     uterine polyps   RE-EXCISION OF BREAST LUMPECTOMY Right 12/22/2019   Procedure: RIGHT BREAST RE-EXCISION OF BREAST LUMPECTOMY;  Surgeon: Almond Lint, MD;  Location: [facility] OR;  Service: General;  Laterality: Right;   WISDOM TOOTH EXTRACTION      I have reviewed the social history and family history with the patient and they are unchanged from previous note.  ALLERGIES:  has No Known Allergies.  MEDICATIONS:  Current Outpatient Medications  Medication Sig Dispense Refill   amLODipine (NORVASC) 5 MG tablet Take 5 mg by mouth daily.     aspirin EC 81 MG tablet Take 81 mg by mouth 3 (three) times a week. Swallow whole.     atorvastatin (LIPITOR) 10 MG tablet Take 10 mg by mouth every other day.      hydrochlorothiazide (MICROZIDE) 12.5 MG capsule Take 12.5 mg by mouth daily.     Multiple Vitamin (MULTIVITAMIN WITH MINERALS) TABS tablet Take 1 tablet by mouth daily.     potassium chloride SA (KLOR-CON M) 20 MEQ tablet Take 1 tablet twice daily for 5 days then once daily 30 tablet 3   tamoxifen (NOLVADEX) 10 MG tablet Take 1/2 (one-half) tablet by mouth  twice daily 90 tablet 1   No current facility-administered medications for this visit.    PHYSICAL EXAMINATION: ECOG PERFORMANCE STATUS: 0 - Asymptomatic  Vitals:   10/09/22 1101  BP: (!) 144/87  Pulse: 84  Resp: 18  Temp: 98.2 F (36.8 C)  SpO2: 100%   Wt Readings from Last 3 Encounters:  10/09/22 183 lb 11.2 oz (83.3 kg)  04/09/22 181 lb 11.2 oz (82.4 kg)  10/08/21 180 lb 6.4 oz (81.8 kg)     GENERAL:alert, no distress and comfortable SKIN: skin color normal, no rashes or significant lesions EYES: normal, Conjunctiva are pink and non-injected, sclera clear  NEURO: alert & oriented x 3 with fluent speech BREAST: rt BREAST around the nipple (firm) no palpable mass,LT breast around the nipple (firm) no palpable mass. Breast exam benign.  LABORATORY DATA:  I have reviewed the data as listed    Latest Ref Rng & Units 10/09/2022   10:39 AM 04/09/2022    9:37 AM 10/08/2021   10:07 AM  CBC  WBC 4.0 - 10.5 K/uL 3.2  2.8  2.7   Hemoglobin 12.0 - 15.0 g/dL 40.9  81.1  91.4   Hematocrit 36.0 - 46.0 % 37.0  36.9  36.4   Platelets 150 - 400 K/uL 152  156  147         Latest Ref Rng & Units 04/09/2022    9:37 AM 10/08/2021   10:07 AM 04/16/2021   10:09 AM  CMP  Glucose 70 - 99 mg/dL 782  956  213   BUN 8 - 23 mg/dL 9  12  7    Creatinine 0.44 - 1.00 mg/dL 0.86  5.78  4.69   Sodium 135 - 145 mmol/L 142  141  142   Potassium 3.5 - 5.1 mmol/L 3.6  3.3  3.5   Chloride 98 - 111 mmol/L 106  105  106   CO2 22 - 32 mmol/L 32  29  29   Calcium 8.9 - 10.3 mg/dL 9.5  9.6  9.1   Total Protein 6.5 - 8.1 g/dL 6.2  6.7  6.7   Total Bilirubin 0.3 - 1.2 mg/dL 0.9  1.1  1.0   Alkaline Phos 38 - 126 U/L 46  40  47   AST 15 - 41 U/L 16  15  20    ALT 0 - 44 U/L 18  19  26        RADIOGRAPHIC STUDIES: I have personally reviewed the radiological images as listed and agreed with the findings in the report. No results found.    No orders of the defined types were placed in this  encounter.  All questions were answered. The patient knows to call the clinic with any problems, questions or concerns. No barriers to learning was detected. The total time spent in the appointment was 25 minutes.     Malachy Mood, MD 10/09/2022   Carolin Coy, CMA, am acting as scribe for Malachy Mood, MD.   I have reviewed the above documentation for accuracy and completeness, and I agree with the above.

## 2022-10-08 NOTE — Assessment & Plan Note (Signed)
Diagnosed in 09/2019. S/p right breast lumpectomy on 11/18/19 and re-excision on 12/22/19 with Dr Donell Beers and no further malignancy was found. she completed adjuvant radiation with Dr Mitzi Hansen on 01/24/20-02/18/20.  -She previously declined genetic testing.  -She started adjuvant Tamoxifen on 02/28/20, Plan for 3-5 years. tolerating 10 mg much better, less joint pain. We discussed the data from TAM-01 trail data, 3 years therapy is probably adequate  -Continue breast cancer surveillance and Tamoxifen. We previously discussed the role of screening breast MRI, she declined. -F/up in 6 months, or sooner if needed

## 2022-10-08 NOTE — Assessment & Plan Note (Signed)
-  Diagnosed in 07/2017, S/p left lumpectomy and adjuvant radiation.  -At the time she declined chemoprevention with antiestrogen therapy.

## 2022-10-09 ENCOUNTER — Encounter: Payer: Self-pay | Admitting: Hematology

## 2022-10-09 ENCOUNTER — Other Ambulatory Visit: Payer: Self-pay

## 2022-10-09 ENCOUNTER — Inpatient Hospital Stay: Payer: Medicare Other | Admitting: Hematology

## 2022-10-09 ENCOUNTER — Inpatient Hospital Stay: Payer: Medicare Other | Attending: Hematology

## 2022-10-09 VITALS — BP 144/87 | HR 84 | Temp 98.2°F | Resp 18 | Ht 69.0 in | Wt 183.7 lb

## 2022-10-09 DIAGNOSIS — D0511 Intraductal carcinoma in situ of right breast: Secondary | ICD-10-CM

## 2022-10-09 DIAGNOSIS — Z90710 Acquired absence of both cervix and uterus: Secondary | ICD-10-CM | POA: Diagnosis not present

## 2022-10-09 DIAGNOSIS — M2550 Pain in unspecified joint: Secondary | ICD-10-CM | POA: Diagnosis not present

## 2022-10-09 DIAGNOSIS — Z79899 Other long term (current) drug therapy: Secondary | ICD-10-CM | POA: Insufficient documentation

## 2022-10-09 DIAGNOSIS — D709 Neutropenia, unspecified: Secondary | ICD-10-CM

## 2022-10-09 DIAGNOSIS — Z79810 Long term (current) use of selective estrogen receptor modulators (SERMs): Secondary | ICD-10-CM | POA: Diagnosis not present

## 2022-10-09 DIAGNOSIS — Z923 Personal history of irradiation: Secondary | ICD-10-CM | POA: Diagnosis not present

## 2022-10-09 DIAGNOSIS — Z170 Estrogen receptor positive status [ER+]: Secondary | ICD-10-CM | POA: Diagnosis not present

## 2022-10-09 DIAGNOSIS — D0512 Intraductal carcinoma in situ of left breast: Secondary | ICD-10-CM | POA: Diagnosis not present

## 2022-10-09 LAB — CBC WITH DIFFERENTIAL (CANCER CENTER ONLY)
Abs Immature Granulocytes: 0 10*3/uL (ref 0.00–0.07)
Basophils Absolute: 0 10*3/uL (ref 0.0–0.1)
Basophils Relative: 1 %
Eosinophils Absolute: 0.1 10*3/uL (ref 0.0–0.5)
Eosinophils Relative: 3 %
HCT: 37 % (ref 36.0–46.0)
Hemoglobin: 12.9 g/dL (ref 12.0–15.0)
Immature Granulocytes: 0 %
Lymphocytes Relative: 28 %
Lymphs Abs: 0.9 10*3/uL (ref 0.7–4.0)
MCH: 30.1 pg (ref 26.0–34.0)
MCHC: 34.9 g/dL (ref 30.0–36.0)
MCV: 86.4 fL (ref 80.0–100.0)
Monocytes Absolute: 0.3 10*3/uL (ref 0.1–1.0)
Monocytes Relative: 9 %
Neutro Abs: 1.9 10*3/uL (ref 1.7–7.7)
Neutrophils Relative %: 59 %
Platelet Count: 152 10*3/uL (ref 150–400)
RBC: 4.28 MIL/uL (ref 3.87–5.11)
RDW: 12.4 % (ref 11.5–15.5)
WBC Count: 3.2 10*3/uL — ABNORMAL LOW (ref 4.0–10.5)
nRBC: 0 % (ref 0.0–0.2)

## 2022-10-09 LAB — CMP (CANCER CENTER ONLY)
ALT: 19 U/L (ref 0–44)
AST: 17 U/L (ref 15–41)
Albumin: 4.1 g/dL (ref 3.5–5.0)
Alkaline Phosphatase: 42 U/L (ref 38–126)
Anion gap: 5 (ref 5–15)
BUN: 10 mg/dL (ref 8–23)
CO2: 32 mmol/L (ref 22–32)
Calcium: 9.5 mg/dL (ref 8.9–10.3)
Chloride: 105 mmol/L (ref 98–111)
Creatinine: 0.89 mg/dL (ref 0.44–1.00)
GFR, Estimated: >60 mL/min (ref >60)
Glucose, Bld: 126 mg/dL — ABNORMAL HIGH (ref 70–99)
Potassium: 3 mmol/L — ABNORMAL LOW (ref 3.5–5.1)
Sodium: 142 mmol/L (ref 135–145)
Total Bilirubin: 1 mg/dL (ref 0.3–1.2)
Total Protein: 6.8 g/dL (ref 6.5–8.1)

## 2022-10-09 LAB — VITAMIN B12: Vitamin B-12: 497 pg/mL (ref 180–914)

## 2022-10-09 MED ORDER — TAMOXIFEN CITRATE 10 MG PO TABS: ORAL_TABLET | ORAL | 1 refills | Status: DC

## 2022-10-10 LAB — FOLATE RBC
Folate, Hemolysate: >620 ng/mL
Folate, RBC: >1645 ng/mL (ref >498)
Hematocrit: 37.7 % (ref 34.0–46.6)

## 2022-10-13 LAB — METHYLMALONIC ACID, SERUM: Methylmalonic Acid, Quantitative: 129 nmol/L (ref 0–378)

## 2022-10-14 ENCOUNTER — Telehealth: Payer: Self-pay

## 2022-10-14 ENCOUNTER — Other Ambulatory Visit: Payer: Self-pay

## 2022-10-14 ENCOUNTER — Other Ambulatory Visit: Payer: Self-pay | Admitting: Hematology

## 2022-10-14 MED ORDER — POTASSIUM CHLORIDE CRYS ER 20 MEQ PO TBCR: EXTENDED_RELEASE_TABLET | ORAL | 2 refills | Status: DC

## 2022-10-14 NOTE — Telephone Encounter (Signed)
Spoke with pt via telephone to inform pt that Dr. Mosetta Putt has reviewed pt's recent labs and the pt's K+ is low.  Stated Dr. Mosetta Putt feels the decrease in the pt's K+ is related to the pt's HTCZ used for fluid reduction and BP management.  Stated that Dr. Mosetta Putt has called in a prescription for K+ PO BID for 5 days and then PO daily.  Instructed pt to not crush medication d/t decrease in absorption.  Stated pt's pharmacy will contact pt when the prescription is available for pick-up.  Informed pt that Dr. Mosetta Putt would like for the patient to f/u w/PCP for management of HTCZ & K+ levels.  Pt verbalized understanding and had no further questions or concerns at this time.

## 2022-12-20 DIAGNOSIS — E876 Hypokalemia: Secondary | ICD-10-CM | POA: Diagnosis not present

## 2022-12-20 DIAGNOSIS — I10 Essential (primary) hypertension: Secondary | ICD-10-CM | POA: Diagnosis not present

## 2022-12-20 DIAGNOSIS — Z0000 Encounter for general adult medical examination without abnormal findings: Secondary | ICD-10-CM | POA: Diagnosis not present

## 2022-12-20 DIAGNOSIS — D0511 Intraductal carcinoma in situ of right breast: Secondary | ICD-10-CM | POA: Diagnosis not present

## 2022-12-20 DIAGNOSIS — E782 Mixed hyperlipidemia: Secondary | ICD-10-CM | POA: Diagnosis not present

## 2022-12-24 ENCOUNTER — Other Ambulatory Visit: Payer: Self-pay | Admitting: Family Medicine

## 2022-12-24 DIAGNOSIS — E2839 Other primary ovarian failure: Secondary | ICD-10-CM

## 2023-01-21 ENCOUNTER — Other Ambulatory Visit: Payer: Self-pay | Admitting: Hematology

## 2023-02-11 DIAGNOSIS — I10 Essential (primary) hypertension: Secondary | ICD-10-CM | POA: Diagnosis not present

## 2023-02-11 DIAGNOSIS — H1132 Conjunctival hemorrhage, left eye: Secondary | ICD-10-CM | POA: Diagnosis not present

## 2023-02-12 DIAGNOSIS — H40023 Open angle with borderline findings, high risk, bilateral: Secondary | ICD-10-CM | POA: Diagnosis not present

## 2023-02-12 DIAGNOSIS — H1132 Conjunctival hemorrhage, left eye: Secondary | ICD-10-CM | POA: Diagnosis not present

## 2023-02-12 DIAGNOSIS — H25813 Combined forms of age-related cataract, bilateral: Secondary | ICD-10-CM | POA: Diagnosis not present

## 2023-03-10 DIAGNOSIS — H25811 Combined forms of age-related cataract, right eye: Secondary | ICD-10-CM | POA: Diagnosis not present

## 2023-03-10 DIAGNOSIS — H25813 Combined forms of age-related cataract, bilateral: Secondary | ICD-10-CM | POA: Diagnosis not present

## 2023-03-10 DIAGNOSIS — Z01818 Encounter for other preprocedural examination: Secondary | ICD-10-CM | POA: Diagnosis not present

## 2023-03-22 ENCOUNTER — Encounter (HOSPITAL_BASED_OUTPATIENT_CLINIC_OR_DEPARTMENT_OTHER): Payer: Self-pay

## 2023-03-22 ENCOUNTER — Emergency Department (HOSPITAL_BASED_OUTPATIENT_CLINIC_OR_DEPARTMENT_OTHER): Payer: Medicare Other

## 2023-03-22 ENCOUNTER — Emergency Department (HOSPITAL_BASED_OUTPATIENT_CLINIC_OR_DEPARTMENT_OTHER)
Admission: EM | Admit: 2023-03-22 | Discharge: 2023-03-22 | Disposition: A | Discharge status: Home / Self Care | Payer: Medicare Other | Attending: Emergency Medicine | Admitting: Emergency Medicine

## 2023-03-22 DIAGNOSIS — M25551 Pain in right hip: Secondary | ICD-10-CM | POA: Diagnosis not present

## 2023-03-22 DIAGNOSIS — R262 Difficulty in walking, not elsewhere classified: Secondary | ICD-10-CM | POA: Diagnosis not present

## 2023-03-22 DIAGNOSIS — M7989 Other specified soft tissue disorders: Secondary | ICD-10-CM | POA: Diagnosis not present

## 2023-03-22 DIAGNOSIS — Z7982 Long term (current) use of aspirin: Secondary | ICD-10-CM | POA: Diagnosis not present

## 2023-03-22 DIAGNOSIS — M1712 Unilateral primary osteoarthritis, left knee: Secondary | ICD-10-CM | POA: Insufficient documentation

## 2023-03-22 DIAGNOSIS — M25562 Pain in left knee: Secondary | ICD-10-CM | POA: Diagnosis present

## 2023-03-22 MED ORDER — ACETAMINOPHEN 500 MG PO TABS
1000.0000 mg | ORAL_TABLET | Freq: Once | ORAL | Status: AC
  Administered 2023-03-22: 1000 mg via ORAL
  Filled 2023-03-22: qty 2

## 2023-03-22 NOTE — ED Provider Notes (Addendum)
[place] EMERGENCY DEPARTMENT AT [facility] Provider Note   CSN: [id number] Arrival date & time: 03/22/23  0825     History  Chief Complaint  Patient presents with   Knee Pain    Victoria Fuller is a 72 y.o. female.  HPI Patient is been having knee pain since about July, roughly 5 months.  It waxes and waning in severity.  Usually worse with more activity and walking.  Patient reports she does like to walk to stay in shape but it is getting more challenging with knee pain.  Pain is often along the anterior knee from about the tibial tuberosity up to above the patella.  She also reports she however sometimes also notes pain behind the knee.  Often worse in the morning and improves somewhat over the course of the day.  No recent injury.  Patient reports that she did apply an Ace wrap a day ago and noted that she got increased swelling in her ankle after she had applied the Ace wrap.  Once removing the wrap, the swelling has gone away.  She think she might of had it too tight.  Patient did have some right hip pain in the spring.  She reports that has improved and she does not feel that her hips are bothering her at this time.  She does have some varicose veins on the leg and was concerned that the pain might be due to a blood clot.  She reports sometimes the leg feels stiff and there are some pain behind the knee.  Patient has no prior history of DVT or other blood clots.  No recent long travel.  She is anticipating a 4-hour drive to family for Thanksgiving.  No chest pain or shortness of breath.  Otherwise feeling well.      Home Medications Prior to Admission medications   Medication Sig Start Date End Date Taking? Authorizing Provider  amLODipine (NORVASC) 5 MG tablet Take 5 mg by mouth daily.    [provider]  aspirin EC 81 MG tablet Take 81 mg by mouth 3 (three) times a week. Swallow whole.    [provider]  atorvastatin (LIPITOR) 10 MG tablet Take 10  mg by mouth every other day.     [provider]  hydrochlorothiazide (MICROZIDE) 12.5 MG capsule Take 12.5 mg by mouth daily.    [provider]  Multiple Vitamin (MULTIVITAMIN WITH MINERALS) TABS tablet Take 1 tablet by mouth daily.    [provider]  potassium chloride SA (KLOR-CON M20) 20 MEQ tablet TAKE 1 TABLET BY MOUTH TWICE DAILY FOR 5 DAYS ,  THEN  ONCE  DAILY 01/21/23   Malachy Mood, MD  tamoxifen (NOLVADEX) 10 MG tablet Take 1/2 (one-half) tablet by mouth twice daily 10/09/22   Malachy Mood, MD      Allergies    Patient has no known allergies.    Review of Systems   Review of Systems  Physical Exam Updated Vital Signs BP (!) 152/91 (BP Location: Right Arm)   Pulse (!) 110   Temp 98.6 F (37 C) (Oral)   Resp 18   Ht 5\' 8"  (1.727 m)   Wt 83.9 kg   SpO2 100%   BMI 28.13 kg/m  Physical Exam Constitutional:      Comments: Alert nontoxic clinically well in appearance.  No respiratory distress.  Pulmonary:     Effort: Pulmonary effort is normal.  Musculoskeletal:     Comments: Bilateral knees have  fairly symmetric hypertrophy suggestive of chronic arthritic changes.  There does appear to be very subtle suprapatellar effusion on the left.  There is no warmth or erythema.  No focal tenderness to palpation.  Patient does endorse that areas of pain are often over the tibial tuberosity and above the patella but at this time no localized swelling or significantly reproducible bony point tenderness.  Patient is able to flex and extend at the knee.  The lower leg is soft and nontender.  Lower legs are symmetric.  No edema present.  Both ankles are symmetric.  She does not have pain to palpation in the popliteal fossa or posterior lower leg.  Both feet are warm and dry in good condition.  Neurological:     General: No focal deficit present.     Mental Status: She is oriented to person, place, and time.  Psychiatric:        Mood and Affect: Mood normal.     ED  Results / Procedures / Treatments   Labs (all labs ordered are listed, but only abnormal results are displayed) Labs Reviewed - No data to display  EKG None  Radiology DG Knee Complete 4 Views Left  Result Date: 03/22/2023 CLINICAL DATA:  swelling.  Difficulty walking. EXAM: LEFT KNEE - COMPLETE 4+ VIEW COMPARISON:  None Available. FINDINGS: No acute fracture or dislocation. No aggressive osseous lesion. There are degenerative changes of the knee joint in the form of mildly reduced medial tibio-femoral compartment joint space, tibial spiking and tricompartmental osteophytosis. No knee effusion or focal soft tissue swelling. No radiopaque foreign bodies. IMPRESSION: *No acute osseous abnormality of the left knee joint. *Mild-to-moderate degenerative arthritis of the left knee joint. Electronically Signed   By: Jules Schick M.D.   On: 03/22/2023 09:35    Procedures Procedures    Medications Ordered in ED Medications  acetaminophen (TYLENOL) tablet 1,000 mg (1,000 mg Oral Given 03/22/23 0919)    ED Course/ Medical Decision Making/ A&P                                 Medical Decision Making Amount and/or Complexity of Data Reviewed Radiology: ordered.  Risk OTC drugs.   Patient presents with persistent left knee pain of 4 to 5 months duration.  This does correlate with activity and morning stiffness.  By history and exam this is most suggestive of degenerative arthritic changes.  Currently very subtle effusion without any large swelling and no erythema.  I have very low suspicion for septic joint.  Patient had concern for DVT however she does not have DVT risk factors and symptoms are not suggestive of DVT.  Her main concern was due to presence of varicosities which are mild to moderate, noninflamed.  This time we will proceed with plain film x-rays to assess for effusion and degenerative changes.  I have personally reviewed 4 view knee x-rays.  There are some small osteophytes  present.  No large effusions present.  At this time plan will be to proceed with extra strength Tylenol for pain control, elevating when not ambulatory.  We have reviewed obtaining a removable, neoprene brace with side supports from medical supply.  I have advised the patient to follow-up with orthopedics within the next several weeks.  Return precautions reviewed.  Specifically reviewed return precautions for DVT.  At this time I have low suspicion for DVT        Final  Clinical Impression(s) / ED Diagnoses Final diagnoses:  Osteoarthritis of left knee, unspecified osteoarthritis type    Rx / DC Orders ED Discharge Orders     None         Arby Barrette, MD 03/22/23 4098    Arby Barrette, MD 03/22/23 [phone]

## 2023-03-22 NOTE — Discharge Instructions (Signed)
1.  Obtain a movable knee support as discussed in the emergency department.  Wear this when you are up and walking and anticipate standing for a while.  Try to elevate and ice the knee when you are at rest.  Extra strength Tylenol every 6 hours if needed for pain. 2.  Schedule a follow-up appointment with the orthopedic specialist.  You may follow-up with someone you have seen previously or Dr. Steward Drone who is on-call for orthopedics today. 3.  You had concern for a blood clot in the leg.  At this time, based on your history and examination, I have low suspicion for blood clot in the leg.  Return for recheck if you notice pain in your calf, swelling, redness or other changes.  Do not apply an Ace wrap to your knee.  Only use the removable Velcro brace as described.

## 2023-03-22 NOTE — ED Triage Notes (Signed)
Onset over the summer of knee pain.  Not sure if injured knee   States yesterday noted more swelling done to ankles and increased pain.  States difficulty walking

## 2023-03-26 ENCOUNTER — Ambulatory Visit (HOSPITAL_BASED_OUTPATIENT_CLINIC_OR_DEPARTMENT_OTHER): Payer: Medicare Other | Admitting: Student

## 2023-03-26 ENCOUNTER — Encounter (HOSPITAL_BASED_OUTPATIENT_CLINIC_OR_DEPARTMENT_OTHER): Payer: Self-pay | Admitting: Student

## 2023-03-26 DIAGNOSIS — M1712 Unilateral primary osteoarthritis, left knee: Secondary | ICD-10-CM

## 2023-03-26 NOTE — Progress Notes (Signed)
Chief Complaint: Left knee pain     History of Present Illness:    Victoria RIVIELLO is a 72 y.o. female presenting today for evaluation of left knee pain.  She states that this began 4 to 5 months ago after she had been using a stationary bike at the gym.  Denies any history of injury to this knee.  Reports today that her pain level is a 3/10.  Pain sometimes radiates just above and below the knee.  She feels sharp pain in the posterior knee with flexion.  She does occasionally also feel a grinding sensation while going up stairs.  Knee often feels stiff particularly after long periods of inactivity.  Has tried Tylenol, heat, and ice.   Surgical History:   None  PMH/PSH/Family History/Social History/Meds/Allergies:    Past Medical History:  Diagnosis Date   Cancer (HCC) 08/2017   Left breast cancer   Hyperlipidemia    Hypertension    Personal history of radiation therapy    left and right breasts   Wears contact lenses    Past Surgical History:  Procedure Laterality Date   ABDOMINAL HYSTERECTOMY     BREAST BIOPSY Left 08/25/2017   BREAST BIOPSY Right 10/06/2019   BREAST LUMPECTOMY Left 08/31/2017   BREAST LUMPECTOMY Right 11/18/2019   BREAST LUMPECTOMY WITH RADIOACTIVE SEED LOCALIZATION Left 09/10/2017   Procedure: BREAST LUMPECTOMY WITH RADIOACTIVE SEED LOCALIZATION;  Surgeon: Almond Lint, MD;  Location: [facility];  Service: General;  Laterality: Left;   BREAST LUMPECTOMY WITH RADIOACTIVE SEED LOCALIZATION Right 11/18/2019   Procedure: RIGHT BREAST LUMPECTOMY WITH RADIOACTIVE SEED LOCALIZATION;  Surgeon: Almond Lint, MD;  Location: [facility];  Service: General;  Laterality: Right;   COLONOSCOPY W/ BIOPSIES AND POLYPECTOMY     DIAGNOSTIC LAPAROSCOPY     uterine polyps   RE-EXCISION OF BREAST LUMPECTOMY Right 12/22/2019   Procedure: RIGHT BREAST RE-EXCISION OF BREAST LUMPECTOMY;  Surgeon: Almond Lint, MD;  Location: [facility];   Service: General;  Laterality: Right;   WISDOM TOOTH EXTRACTION     Social History   Socioeconomic History   Marital status: Married    Spouse name: Not on file   Number of children: Not on file   Years of education: Not on file   Highest education level: Not on file  Occupational History   Not on file  Tobacco Use   Smoking status: Never   Smokeless tobacco: Never  Vaping Use   Vaping status: Never Used  Substance and Sexual Activity   Alcohol use: Yes    Comment: rare   Drug use: Never   Sexual activity: Yes  Other Topics Concern   Not on file  Social History Narrative   Not on file   Social Determinants of Health   Financial Resource Strain: Not on file  Food Insecurity: Not on file  Transportation Needs: Not on file  Physical Activity: Not on file  Stress: Not on file  Social Connections: Not on file   Family History  Problem Relation Age of Onset   Cancer Mother 59       ovarian cancer    Prostate cancer Father    No Known Allergies Current Outpatient Medications  Medication Sig Dispense Refill   amLODipine (NORVASC) 5 MG tablet Take 5 mg by mouth daily.  aspirin EC 81 MG tablet Take 81 mg by mouth 3 (three) times a week. Swallow whole.     atorvastatin (LIPITOR) 10 MG tablet Take 10 mg by mouth every other day.      hydrochlorothiazide (MICROZIDE) 12.5 MG capsule Take 12.5 mg by mouth daily.     Multiple Vitamin (MULTIVITAMIN WITH MINERALS) TABS tablet Take 1 tablet by mouth daily.     potassium chloride SA (KLOR-CON M20) 20 MEQ tablet TAKE 1 TABLET BY MOUTH TWICE DAILY FOR 5 DAYS ,  THEN  ONCE  DAILY 30 tablet 0   tamoxifen (NOLVADEX) 10 MG tablet Take 1/2 (one-half) tablet by mouth twice daily 90 tablet 1   No current facility-administered medications for this visit.   No results found.  Review of Systems:   A ROS was performed including pertinent positives and negatives as documented in the HPI.  Physical Exam :   Constitutional: NAD and  appears stated age Neurological: Alert and oriented Psych: Appropriate affect and cooperative There were no vitals taken for this visit.   Comprehensive Musculoskeletal Exam:    Left knee exam reveals no significant edema or effusion of the left knee.  Active range of motion from 0 to 90 degrees with some palpable crepitus.  Negative for tenderness over bilateral joint lines.  No instability with varus or valgus stress.  Knee flexion and extension strength is 5/5.  Imaging:   Xray review from 03/22/2023 (left knee 4 views): Bilateral joint line and patellofemoral osteophytes.  Degenerative changes are noted throughout but joint spaces overall well-maintained.  No acute abnormality.   I personally reviewed and interpreted the radiographs.   Assessment:   72 y.o. female with chronic and atraumatic left knee pain ongoing for the last 5 months.  X-rays taken today do show evidence of mild to moderate osteoarthritis throughout the left knee.  No symptoms or exam findings that raise suspicion for ligamentous injury.  I have discussed this pathology in depth as well as different treatment options.  After consideration patient would like to proceed conservatively and will plan on a trial of Voltaren gel.  Did discuss that I believe she would benefit well from a cortisone injection so she will consider this and return if her symptoms continue or worsen.  All other questions addressed.  Plan :    -Begin conservative management for left knee osteoarthritis return to clinic as needed     I personally saw and evaluated the patient, and participated in the management and treatment plan.  Hazle Nordmann, PA-C Orthopedics

## 2023-03-31 ENCOUNTER — Other Ambulatory Visit: Payer: Self-pay | Admitting: Hematology

## 2023-03-31 NOTE — Telephone Encounter (Signed)
I am going to refill for 2 weeks. She has labs and follow up scheduled 04/10/2023 Herbert Seta, NP

## 2023-04-02 DIAGNOSIS — H2513 Age-related nuclear cataract, bilateral: Secondary | ICD-10-CM | POA: Diagnosis not present

## 2023-04-02 DIAGNOSIS — H25811 Combined forms of age-related cataract, right eye: Secondary | ICD-10-CM | POA: Diagnosis not present

## 2023-04-02 DIAGNOSIS — I10 Essential (primary) hypertension: Secondary | ICD-10-CM | POA: Diagnosis not present

## 2023-04-08 ENCOUNTER — Telehealth: Payer: Self-pay | Admitting: Nurse Practitioner

## 2023-04-10 ENCOUNTER — Inpatient Hospital Stay: Payer: Medicare Other | Admitting: Nurse Practitioner

## 2023-04-10 ENCOUNTER — Encounter: Payer: Self-pay | Admitting: Nurse Practitioner

## 2023-04-10 ENCOUNTER — Inpatient Hospital Stay: Payer: Medicare Other | Attending: Nurse Practitioner

## 2023-04-10 VITALS — BP 152/103 | HR 83 | Temp 98.3°F | Resp 17 | Wt 179.9 lb

## 2023-04-10 DIAGNOSIS — Z923 Personal history of irradiation: Secondary | ICD-10-CM | POA: Insufficient documentation

## 2023-04-10 DIAGNOSIS — D696 Thrombocytopenia, unspecified: Secondary | ICD-10-CM | POA: Diagnosis not present

## 2023-04-10 DIAGNOSIS — D72819 Decreased white blood cell count, unspecified: Secondary | ICD-10-CM | POA: Diagnosis not present

## 2023-04-10 DIAGNOSIS — I10 Essential (primary) hypertension: Secondary | ICD-10-CM | POA: Insufficient documentation

## 2023-04-10 DIAGNOSIS — Z90710 Acquired absence of both cervix and uterus: Secondary | ICD-10-CM | POA: Diagnosis not present

## 2023-04-10 DIAGNOSIS — Z79810 Long term (current) use of selective estrogen receptor modulators (SERMs): Secondary | ICD-10-CM | POA: Diagnosis not present

## 2023-04-10 DIAGNOSIS — L819 Disorder of pigmentation, unspecified: Secondary | ICD-10-CM | POA: Insufficient documentation

## 2023-04-10 DIAGNOSIS — E785 Hyperlipidemia, unspecified: Secondary | ICD-10-CM | POA: Insufficient documentation

## 2023-04-10 DIAGNOSIS — Z79899 Other long term (current) drug therapy: Secondary | ICD-10-CM | POA: Insufficient documentation

## 2023-04-10 DIAGNOSIS — D0512 Intraductal carcinoma in situ of left breast: Secondary | ICD-10-CM | POA: Insufficient documentation

## 2023-04-10 DIAGNOSIS — D0511 Intraductal carcinoma in situ of right breast: Secondary | ICD-10-CM | POA: Diagnosis not present

## 2023-04-10 DIAGNOSIS — R635 Abnormal weight gain: Secondary | ICD-10-CM | POA: Diagnosis not present

## 2023-04-10 LAB — CMP (CANCER CENTER ONLY)
ALT: 19 U/L (ref 0–44)
AST: 16 U/L (ref 15–41)
Albumin: 4.5 g/dL (ref 3.5–5.0)
Alkaline Phosphatase: 54 U/L (ref 38–126)
Anion gap: 5 (ref 5–15)
BUN: 8 mg/dL (ref 8–23)
CO2: 32 mmol/L (ref 22–32)
Calcium: 9.8 mg/dL (ref 8.9–10.3)
Chloride: 105 mmol/L (ref 98–111)
Creatinine: 0.63 mg/dL (ref 0.44–1.00)
GFR, Estimated: >60 mL/min (ref >60)
Glucose, Bld: 107 mg/dL — ABNORMAL HIGH (ref 70–99)
Potassium: 3.8 mmol/L (ref 3.5–5.1)
Sodium: 142 mmol/L (ref 135–145)
Total Bilirubin: 1.3 mg/dL — ABNORMAL HIGH (ref <1.2)
Total Protein: 7 g/dL (ref 6.5–8.1)

## 2023-04-10 LAB — CBC WITH DIFFERENTIAL (CANCER CENTER ONLY)
Abs Immature Granulocytes: 0.01 10*3/uL (ref 0.00–0.07)
Basophils Absolute: 0.1 10*3/uL (ref 0.0–0.1)
Basophils Relative: 2 %
Eosinophils Absolute: 0.1 10*3/uL (ref 0.0–0.5)
Eosinophils Relative: 3 %
HCT: 39.4 % (ref 36.0–46.0)
Hemoglobin: 13.3 g/dL (ref 12.0–15.0)
Immature Granulocytes: 0 %
Lymphocytes Relative: 32 %
Lymphs Abs: 1 10*3/uL (ref 0.7–4.0)
MCH: 29.7 pg (ref 26.0–34.0)
MCHC: 33.8 g/dL (ref 30.0–36.0)
MCV: 87.9 fL (ref 80.0–100.0)
Monocytes Absolute: 0.4 10*3/uL (ref 0.1–1.0)
Monocytes Relative: 12 %
Neutro Abs: 1.6 10*3/uL — ABNORMAL LOW (ref 1.7–7.7)
Neutrophils Relative %: 51 %
Platelet Count: 157 10*3/uL (ref 150–400)
RBC: 4.48 MIL/uL (ref 3.87–5.11)
RDW: 12.5 % (ref 11.5–15.5)
WBC Count: 3.1 10*3/uL — ABNORMAL LOW (ref 4.0–10.5)
nRBC: 0 % (ref 0.0–0.2)

## 2023-04-10 NOTE — Progress Notes (Signed)
Patient Care Team: Deatra James, MD as PCP - General (Family Medicine) Almond Lint, MD as Consulting Physician (General Surgery) Malachy Mood, MD as Consulting Physician (Hematology) Dorothy Puffer, MD as Consulting Physician (Radiation Oncology) Axel Filler, Larna Daughters, NP as Nurse Practitioner (Hematology and Oncology) Pollyann Samples, NP as Nurse Practitioner (Nurse Practitioner)   CHIEF COMPLAINT: Follow up h/o bilateral DCIS  Oncology History Overview Note  Cancer Staging Ductal carcinoma in situ (DCIS) of left breast Staging form: Breast, AJCC 8th Edition - Clinical stage from 08/25/2017: Stage 0 (cTis (DCIS), cN0, cM0, ER: Unknown, PR: Unknown, HER2: Not Assessed) - Signed by Malachy Mood, MD on 09/03/2017 Nuclear grade: G2 Laterality: Left - Pathologic: Stage 0 (pTis (DCIS), pN0, cM0, ER+, PR+) - Unsigned  Ductal carcinoma in situ (DCIS) of right breast Staging form: Breast, AJCC 8th Edition - Clinical stage from 10/06/2019: Stage 0 (cTis (DCIS), cN0, cM0, ER+, PR+, HER2: Not Assessed) - Signed by Pollyann Samples, NP on 06/12/2020 Stage prefix: Initial diagnosis Nuclear grade: G2 - Pathologic stage from 06/12/2020: Stage 0 (pTis (DCIS), pN0, cM0, ER+, PR+, HER2: Not Assessed) - Signed by Pollyann Samples, NP on 06/12/2020 Nuclear grade: G2     Ductal carcinoma in situ (DCIS) of left breast  08/21/2017 Mammogram   IMPRESSION: New grouped pleomorphic calcifications within the upper-outer quadrant of the LEFT breast, spanning 4 mm extent. This is a suspicious finding for which stereotactic biopsy is recommended.   08/25/2017 Initial Biopsy   Diagnosis 08/25/17 Breast, left, needle core biopsy, upper outer quadrant coil clip - DUCTAL CARCINOMA IN SITU WITH CALCIFICATIONS, INTERMEDIATE GRADE. - DUCTAL PAPILLOMA. - SEE MICROSCOPIC DESCRIPTION.   08/25/2017 Cancer Staging   Staging form: Breast, AJCC 8th Edition - Clinical stage from 08/25/2017: Stage 0 (cTis (DCIS), cN0, cM0, ER:  Unknown, PR: Unknown, HER2: Not Assessed) - Signed by Malachy Mood, MD on 09/03/2017   08/29/2017 Initial Diagnosis   Ductal carcinoma in situ (DCIS) of left breast   09/10/2017 Surgery   left BREAST LUMPECTOMY WITH RADIOACTIVE SEED LOCALIZATION by Almond Lint, MD   09/10/2017 Pathology Results   09/10/2017 Surgical Pathology Diagnosis Breast, lumpectomy - DUCTAL CARCINOMA IN SITU, INTERMEDIATE NUCLEAR GRADE, WITH CALCIFICATIONS - MARGINS UNINVOLVED BY CARCINOMA - DUCT ECTASIA AND PERIDUCTULAR CHRONIC INFLAMMATION - FIBROADENOMATOID NODULE - PREVIOUS BIOPSY SITE CHANGES - SEE ONCOLOGY TABLE BELOW     10/08/2017 - 11/06/2017 Radiation Therapy   The patient initially received a dose of 42.56 Gy in 16 fractions to the breast using whole-breast tangent fields. This was delivered using a 3-D conformal technique. The patient then received a boost to the seroma. This delivered an additional 8 Gy in 4 fractions using a 3 field photon technique due to the depth of the seroma. The total dose was 50.56 Gy.    Anti-estrogen oral therapy   She declined anti-estrogen therapy due to concerns with side effects.    Ductal carcinoma in situ (DCIS) of right breast  09/29/2019 Mammogram   Diagnostic Mammogram on 09/29/19  IMPRESSION: Suspicious calcifications spanning 7mm in the 12 o'clock region of the right breast.   10/06/2019 Initial Biopsy   Diagnosis Breast, right, needle core biopsy, upper central, coil clip - DUCTAL CARCINOMA IN SITU WITH CALCIFICATIONS, INTERMEDIATE NUCLEAR GRADE. - SEE MICROSCOPIC DESCRIPTION. Microscopic Comment Estrogen and progesterone receptors will be performed. PROGNOSTIC INDICATOR RESULTS: Immunohistochemical and morphometric analysis performed manually Estrogen Receptor: 100%, STRONG STAINING INTESITY Progesterone Receptor: 80%, STRONG STAINING INTESITY   10/06/2019 Cancer Staging  Staging form: Breast, AJCC 8th Edition - Clinical stage from 10/06/2019: Stage 0 (cTis  (DCIS), cN0, cM0, ER+, PR+, HER2: Not Assessed) - Signed by Pollyann Samples, NP on 06/12/2020 Stage prefix: Initial diagnosis Nuclear grade: G2   10/13/2019 Initial Diagnosis   Ductal carcinoma in situ (DCIS) of right breast   11/18/2019 Surgery   RIGHT BREAST LUMPECTOMY WITH RADIOACTIVE SEED LOCALIZATION with Dr Donell Beers    11/18/2019 Pathology Results   FINAL MICROSCOPIC DIAGNOSIS:   A. BREAST, RIGHT, LUMPECTOMY:  - Ductal carcinoma in situ with calcifications, intermediate grade with  focal necrosis, 1.3 cm  - No evidence of invasive carcinoma  - DCIS focally involves the lateral margin and is 0.3 cm from posterior  margin  - Atypical ductal hyperplasia, see comment  - Biopsy site changes  - See oncology table    COMMENT:   The small focus of ADH has a dissimilar appearance from rest of the DCIS  and is about 1 mm from the posterior margin.    12/22/2019 Surgery   RIGHT BREAST RE-EXCISION OF BREAST LUMPECTOMY with Dr Donell Beers   12/22/2019 Pathology Results   FINAL MICROSCOPIC DIAGNOSIS:   A. BREAST, RIGHT, LATERAL MARGIN, RE-EXCISION:  - Resection site changes.  - Fibrocystic change.  - No malignancy identified.    01/24/2020 - 02/18/2020 Radiation Therapy   Adjuvant Radiation with Dr Mitzi Hansen    02/2020 -  Anti-estrogen oral therapy   Tamoxifen 20mg  starting 02/2020, will start at 10mg  and if tolerable will increase 20mg .    06/12/2020 Survivorship   SCP reviewed virtually by Santiago Glad, NP. To be mailed to patient after visit    06/12/2020 Cancer Staging   Staging form: Breast, AJCC 8th Edition - Pathologic stage from 06/12/2020: Stage 0 (pTis (DCIS), pN0, cM0, ER+, PR+, HER2: Not Assessed) - Signed by Pollyann Samples, NP on 06/12/2020 Nuclear grade: G2      CURRENT THERAPY: Tamoxifen 10 mg daily 02/2020 -02/2023  INTERVAL HISTORY [name] returns for follow up as scheduled. Last seen by Dr. Mosetta Putt in 09/2022. Mammo 10/07/22 was benign. She completed 3 years of  tamoxifen and stopped in November, does not notice much change since coming off, still feels well in general with no specific complaints.  ROS  All other systems reviewed and negative  Past Medical History:  Diagnosis Date   Cancer (HCC) 08/2017   Left breast cancer   Hyperlipidemia    Hypertension    Personal history of radiation therapy    left and right breasts   Wears contact lenses      Past Surgical History:  Procedure Laterality Date   ABDOMINAL HYSTERECTOMY     BREAST BIOPSY Left 08/25/2017   BREAST BIOPSY Right 10/06/2019   BREAST LUMPECTOMY Left 08/31/2017   BREAST LUMPECTOMY Right 11/18/2019   BREAST LUMPECTOMY WITH RADIOACTIVE SEED LOCALIZATION Left 09/10/2017   Procedure: BREAST LUMPECTOMY WITH RADIOACTIVE SEED LOCALIZATION;  Surgeon: Almond Lint, MD;  Location: [facility];  Service: General;  Laterality: Left;   BREAST LUMPECTOMY WITH RADIOACTIVE SEED LOCALIZATION Right 11/18/2019   Procedure: RIGHT BREAST LUMPECTOMY WITH RADIOACTIVE SEED LOCALIZATION;  Surgeon: Almond Lint, MD;  Location: [facility];  Service: General;  Laterality: Right;   COLONOSCOPY W/ BIOPSIES AND POLYPECTOMY     DIAGNOSTIC LAPAROSCOPY     uterine polyps   RE-EXCISION OF BREAST LUMPECTOMY Right 12/22/2019   Procedure: RIGHT BREAST RE-EXCISION OF BREAST LUMPECTOMY;  Surgeon: Almond Lint, MD;  Location: [facility];  Service:  General;  Laterality: Right;   WISDOM TOOTH EXTRACTION       Outpatient Encounter Medications as of 04/10/2023  Medication Sig   amLODipine (NORVASC) 5 MG tablet Take 5 mg by mouth daily.   aspirin EC 81 MG tablet Take 81 mg by mouth 3 (three) times a week. Swallow whole.   atorvastatin (LIPITOR) 10 MG tablet Take 10 mg by mouth every other day.    hydrochlorothiazide (MICROZIDE) 12.5 MG capsule Take 12.5 mg by mouth daily.   Multiple Vitamin (MULTIVITAMIN WITH MINERALS) TABS tablet Take 1 tablet by mouth daily.   ofloxacin (OCUFLOX) 0.3 % ophthalmic solution  INSTILL 1 DROP INTO RIGHT EYE 4 TIMES DAILY FOR 7 DAYS   potassium chloride SA (KLOR-CON M20) 20 MEQ tablet TAKE 1 TABLET BY MOUTH TWICE DAILY FOR 5 DAYS ,  THEN  ONCE  DAILY   prednisoLONE acetate (PRED FORTE) 1 % ophthalmic suspension INSTILL 1 DROP INTO THE RIGHT EYE FOUR TIMES DAILY FOR 1 WEEK, THEN TWICE DAIY FOR 1 WEEK, THEN DAILY FOR 2 WEEKS, THEN STOP   [DISCONTINUED] tamoxifen (NOLVADEX) 10 MG tablet Take 1/2 (one-half) tablet by mouth twice daily (Patient not taking: Reported on 04/10/2023)   No facility-administered encounter medications on file as of 04/10/2023.     Today's Vitals   04/10/23 0921 04/10/23 0924  BP: (!) 152/103   Pulse: 83   Resp: 17   Temp: 98.3 F (36.8 C)   TempSrc: Temporal   SpO2: 100%   Weight: 179 lb 14.4 oz (81.6 kg)   PainSc:  0-No pain   Body mass index is 27.35 kg/m.   PHYSICAL EXAM GENERAL:alert, no distress and comfortable SKIN: no rash  EYES: sclera clear NECK: without mass LYMPH:  no palpable cervical or supraclavicular lymphadenopathy  LUNGS: normal breathing effort HEART: no lower extremity edema ABDOMEN: abdomen soft, non-tender and normal bowel sounds NEURO: alert & oriented x 3 with fluent speech, no focal motor/sensory deficits Breast exam: S/p bilateral mastectomy, incisions completely healed.  No palpable mass or nodularity in either breast or axilla that I could appreciate   CBC    Component Value Date/Time   WBC 3.1 (L) 04/10/2023 0904   WBC 2.8 (L) 04/09/2022 0937   RBC 4.48 04/10/2023 0904   HGB 13.3 04/10/2023 0904   HCT 39.4 04/10/2023 0904   HCT 37.7 10/09/2022 1039   PLT 157 04/10/2023 0904   MCV 87.9 04/10/2023 0904   MCH 29.7 04/10/2023 0904   MCHC 33.8 04/10/2023 0904   RDW 12.5 04/10/2023 0904   LYMPHSABS 1.0 04/10/2023 0904   MONOABS 0.4 04/10/2023 0904   EOSABS 0.1 04/10/2023 0904   BASOSABS 0.1 04/10/2023 0904     CMP     Component Value Date/Time   NA 142 04/10/2023 0904   K 3.8 04/10/2023  0904   CL 105 04/10/2023 0904   CO2 32 04/10/2023 0904   GLUCOSE 107 (H) 04/10/2023 0904   BUN 8 04/10/2023 0904   CREATININE 0.63 04/10/2023 0904   CALCIUM 9.8 04/10/2023 0904   PROT 7.0 04/10/2023 0904   ALBUMIN 4.5 04/10/2023 0904   AST 16 04/10/2023 0904   ALT 19 04/10/2023 0904   ALKPHOS 54 04/10/2023 0904   BILITOT 1.3 (H) 04/10/2023 0904   GFRNONAA >60 04/10/2023 0904   GFRAA >60 12/22/2019 0656   GFRAA >60 09/03/2017 1227     ASSESSMENT & PLAN:Olevia Ringuette is a 72 y.o. female with    1. 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 S/p right breast lumpectomy on 11/18/19 and re-excision on 12/22/19 with Dr Donell Beers and no further malignancy was found. she completed adjuvant radiation with Dr Mitzi Hansen on 01/24/20-02/18/20.  -She previously declined genetic testing.  -She completed 3 years of adjuvant Tamoxifen on 02/28/20 - 02/2023 based on TAM 01 data -Last mammogram 09/2022 was benign.  She previously declined screening MRI -Sweetly is clinically doing well.  Exam is benign, labs are stable.  Overall no clinical concern for recurrence -Continue surveillance, transition to annual follow-up    2. 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 Stage 0 grade 2, ER+/PR+, G2 -Diagnosed in 07/2017, S/p left lumpectomy and adjuvant radiation.  -At the time she declined chemoprevention with antiestrogen therapy.   3. Skin hyperpigmentation -She has scattered hyperpigmentation to the lower legs, began after starting tamoxifen -No new eruptions but existing areas have not resolved; no change on lower dose tamoxifen 10 mg. -We previously discussed dermatology referral, she prefers to monitor for now -Large area to outer left forearm, has not resolved since stopping tamoxifen a month ago, likely unrelated   4. Leukopenia, thrombocytopenia  -She had mild leukopenia since at least 2019, WBC 3.4 -CBC 11/09/2019  shows WBC 2.9, platelet 143, no anemia -She continues to have a mild leukopenia/neutropenia and intermittent thrombocytopenia. -Denies history of chronic/recurrent infection, liver disease, spleen issue -She takes multivitamin sporadically -the etiology is unclear, ?tamoxifen, nutritional, or other such as benign ethnic neutropenia.   -Folate and B12 were normal 09/2022 -Labs reviewed, she has persistent mild intermittent leukopenia/neutropenia -Monitoring   5. Hypertension and dyslipidemia, weight gain -Continue medication and follow-up with primary care physician -weight is stable      PLAN: -Last mammogram and today's labs reviewed -Completed tamoxifen in 02/2023 -Continue breast cancer surveillance, next mammo 09/2023 -Follow-up in 1 year, or sooner if needed  Orders Placed This Encounter  Procedures   MM 3D SCREENING MAMMOGRAM BILATERAL BREAST    Standing Status:   Future    Expected Date:   10/08/2023    Expiration Date:   04/09/2024    Reason for Exam (SYMPTOM  OR DIAGNOSIS REQUIRED):   h/o bilateral DCIS    Preferred imaging location?:   [facility]      All questions were answered. The patient knows to call the clinic with any problems, questions or concerns. No barriers to learning were detected.   Santiago Glad, NP-C 04/10/2023

## 2023-04-14 DIAGNOSIS — H25812 Combined forms of age-related cataract, left eye: Secondary | ICD-10-CM | POA: Diagnosis not present

## 2023-06-23 DIAGNOSIS — I10 Essential (primary) hypertension: Secondary | ICD-10-CM | POA: Diagnosis not present

## 2023-06-23 DIAGNOSIS — E782 Mixed hyperlipidemia: Secondary | ICD-10-CM | POA: Diagnosis not present

## 2023-06-23 DIAGNOSIS — C50411 Malignant neoplasm of upper-outer quadrant of right female breast: Secondary | ICD-10-CM | POA: Diagnosis not present

## 2023-09-05 ENCOUNTER — Ambulatory Visit: Payer: Medicare Other

## 2023-09-05 ENCOUNTER — Other Ambulatory Visit: Payer: Medicare Other

## 2023-09-05 ENCOUNTER — Other Ambulatory Visit: Payer: Self-pay | Admitting: Family Medicine

## 2023-09-05 DIAGNOSIS — E2839 Other primary ovarian failure: Secondary | ICD-10-CM

## 2023-10-08 ENCOUNTER — Ambulatory Visit

## 2023-10-09 ENCOUNTER — Ambulatory Visit
Admission: RE | Admit: 2023-10-09 | Discharge: 2023-10-09 | Disposition: A | Discharge status: Home / Self Care | Source: Ambulatory Visit | Attending: Nurse Practitioner | Admitting: Nurse Practitioner

## 2023-10-09 ENCOUNTER — Other Ambulatory Visit

## 2023-10-09 DIAGNOSIS — Z1231 Encounter for screening mammogram for malignant neoplasm of breast: Secondary | ICD-10-CM | POA: Diagnosis not present

## 2023-10-09 DIAGNOSIS — H04123 Dry eye syndrome of bilateral lacrimal glands: Secondary | ICD-10-CM | POA: Diagnosis not present

## 2023-10-09 DIAGNOSIS — H16143 Punctate keratitis, bilateral: Secondary | ICD-10-CM | POA: Diagnosis not present

## 2023-10-09 DIAGNOSIS — D0512 Intraductal carcinoma in situ of left breast: Secondary | ICD-10-CM

## 2023-10-09 DIAGNOSIS — D0511 Intraductal carcinoma in situ of right breast: Secondary | ICD-10-CM

## 2023-10-27 DIAGNOSIS — C50411 Malignant neoplasm of upper-outer quadrant of right female breast: Secondary | ICD-10-CM | POA: Diagnosis not present

## 2023-10-27 DIAGNOSIS — E782 Mixed hyperlipidemia: Secondary | ICD-10-CM | POA: Diagnosis not present

## 2023-10-27 DIAGNOSIS — D0511 Intraductal carcinoma in situ of right breast: Secondary | ICD-10-CM | POA: Diagnosis not present

## 2023-10-27 DIAGNOSIS — I10 Essential (primary) hypertension: Secondary | ICD-10-CM | POA: Diagnosis not present

## 2023-11-27 DIAGNOSIS — D0511 Intraductal carcinoma in situ of right breast: Secondary | ICD-10-CM | POA: Diagnosis not present

## 2023-11-27 DIAGNOSIS — C50411 Malignant neoplasm of upper-outer quadrant of right female breast: Secondary | ICD-10-CM | POA: Diagnosis not present

## 2023-11-27 DIAGNOSIS — I10 Essential (primary) hypertension: Secondary | ICD-10-CM | POA: Diagnosis not present

## 2023-11-27 DIAGNOSIS — E782 Mixed hyperlipidemia: Secondary | ICD-10-CM | POA: Diagnosis not present

## 2023-12-10 ENCOUNTER — Other Ambulatory Visit: Payer: Self-pay

## 2023-12-10 ENCOUNTER — Emergency Department (HOSPITAL_BASED_OUTPATIENT_CLINIC_OR_DEPARTMENT_OTHER)
Admission: EM | Admit: 2023-12-10 | Discharge: 2023-12-10 | Disposition: A | Discharge status: Home / Self Care | Attending: Emergency Medicine | Admitting: Emergency Medicine

## 2023-12-10 ENCOUNTER — Encounter (HOSPITAL_BASED_OUTPATIENT_CLINIC_OR_DEPARTMENT_OTHER): Payer: Self-pay

## 2023-12-10 DIAGNOSIS — I10 Essential (primary) hypertension: Secondary | ICD-10-CM | POA: Diagnosis not present

## 2023-12-10 DIAGNOSIS — M25562 Pain in left knee: Secondary | ICD-10-CM

## 2023-12-10 DIAGNOSIS — Z79899 Other long term (current) drug therapy: Secondary | ICD-10-CM | POA: Insufficient documentation

## 2023-12-10 DIAGNOSIS — Z7982 Long term (current) use of aspirin: Secondary | ICD-10-CM | POA: Diagnosis not present

## 2023-12-10 DIAGNOSIS — M79605 Pain in left leg: Secondary | ICD-10-CM | POA: Diagnosis present

## 2023-12-10 NOTE — Discharge Instructions (Addendum)
 No signs of blood clot in the leg today.  Most likely related to arthritis in your knee.  Call the orthopedist for follow up.  Take tylenol  as needed for pain.  You can wear the knee sleeve as needed if it helps for comfort.  If you start getting redness of your leg, significant swelling of the leg, fever or the pain moves return to the emergency room.

## 2023-12-10 NOTE — ED Provider Notes (Signed)
 [place] EMERGENCY DEPARTMENT AT [facility] Provider Note   CSN: [id number] Arrival date & time: 12/10/23  [id number]     Patient presents with: Knee Pain   Victoria Fuller is a 73 y.o. female.   Patient is a 73 year old female with a history of hypertension, hyperlipidemia who is presenting today with complaints of pain bedtime to her left knee that is been intermittent over the last 2 weeks.  She reports that it is not there all the time but when it is there it is very uncomfortable to walk and stand.  She has not noticed any swelling in her leg.  No redness noted.  She denies any recent immobilization and no prior history of DVT.  She does take an aspirin every other day but no other anticoagulants.  The history is provided by the patient.       Prior to Admission medications   Medication Sig Start Date End Date Taking? Authorizing Provider  hydrochlorothiazide  (HYDRODIURIL ) 12.5 MG tablet Take 12.5 mg by mouth every morning. 12/01/23  Yes [provider]  amLODipine  (NORVASC ) 5 MG tablet Take 5 mg by mouth daily.    [provider]  aspirin EC 81 MG tablet Take 81 mg by mouth 3 (three) times a week. Swallow whole.    [provider]  atorvastatin  (LIPITOR) 10 MG tablet Take 10 mg by mouth every other day.     [provider]  hydrochlorothiazide  (MICROZIDE ) 12.5 MG capsule Take 12.5 mg by mouth daily.    [provider]  Multiple Vitamin (MULTIVITAMIN WITH MINERALS) TABS tablet Take 1 tablet by mouth daily.    [provider]  ofloxacin (OCUFLOX) 0.3 % ophthalmic solution INSTILL 1 DROP INTO RIGHT EYE 4 TIMES DAILY FOR 7 DAYS 03/26/23   [provider]  potassium chloride  SA (KLOR-CON  M20) 20 MEQ tablet TAKE 1 TABLET BY MOUTH TWICE DAILY FOR 5 DAYS ,  THEN  ONCE  DAILY 03/31/23   Boscia, Heather E, NP  prednisoLONE acetate (PRED FORTE) 1 % ophthalmic suspension INSTILL 1 DROP INTO THE RIGHT EYE FOUR TIMES DAILY FOR  1 WEEK, THEN TWICE DAIY FOR 1 WEEK, THEN DAILY FOR 2 WEEKS, THEN STOP 03/26/23   [provider]    Allergies: Patient has no known allergies.    Review of Systems  Updated Vital Signs BP 123/70 (BP Location: Right Arm)   Pulse 87   Temp 97.6 F (36.4 C) (Oral)   Resp 16   Ht 5' 8 (1.727 m)   Wt 80.7 kg   SpO2 100%   BMI 27.06 kg/m   Physical Exam Vitals reviewed.  HENT:     Head: Normocephalic.  Musculoskeletal:     Left knee: Normal range of motion. No tenderness.     Comments: No calf tenderness at this time.  The left knee looks slightly larger than the right with possible small effusion.  However patient is able to flex and extend and is not having significant pain at this time.  No pain with palpation in the popliteal fossa.  No thigh pain  Skin:    General: Skin is warm and dry.  Neurological:     Mental Status: She is alert. Mental status is at baseline.     (all labs ordered are listed, but only abnormal results are displayed) Labs Reviewed - No data to display  EKG: None  Radiology: No results found.   Procedures   Medications Ordered in the ED -  No data to display EMERGENCY DEPARTMENT US  EXTREMITY EXAM Study:  Limited Duplex of left Lower Extremity Veins  INDICATIONS: Leg pain Visualization of  regions in transverse plane with full compression visualized.   PERFORMED BY: Myself IMAGES ARCHIVED?: Yes VIEWS USED: Saphenous-femoral junction, Proximal femoral vein, and Popliteal vein INTERPRETATION: No DVT visualized                                     Medical Decision Making  Patient presenting today due to complaints of pain behind her left knee.  This has been intermittent for the last 2 weeks.  Otherwise well-appearing and no other complaints.  Low suspicion for lumbar radiculopathy or infectious etiology.  No evidence of a septic knee.  Patient is concerned for blood clots but has no history of clots in the past and does not have  findings classic for DVT.  Suspect most likely related to arthritis in her knee.  Patient had x-ray done last year that showed mild to moderate osteoarthritis.  Ultrasound at bedside was done as there is no ultrasound provider present at this time and bedside ultrasound was negative for DVT.     Final diagnoses:  Acute pain of left knee    ED Discharge Orders     None          Doretha Folks, MD 12/10/23 1103

## 2023-12-10 NOTE — ED Notes (Signed)
 DC paperwork given and verbally understood.

## 2023-12-10 NOTE — ED Triage Notes (Signed)
 Pain to posterior left knee radiating down left leg, onset 2 weeks ago. Pain worse with ambulating and at night.

## 2023-12-11 DIAGNOSIS — M1712 Unilateral primary osteoarthritis, left knee: Secondary | ICD-10-CM | POA: Diagnosis not present

## 2023-12-28 DIAGNOSIS — E782 Mixed hyperlipidemia: Secondary | ICD-10-CM | POA: Diagnosis not present

## 2023-12-28 DIAGNOSIS — C50411 Malignant neoplasm of upper-outer quadrant of right female breast: Secondary | ICD-10-CM | POA: Diagnosis not present

## 2023-12-28 DIAGNOSIS — D0511 Intraductal carcinoma in situ of right breast: Secondary | ICD-10-CM | POA: Diagnosis not present

## 2023-12-28 DIAGNOSIS — I10 Essential (primary) hypertension: Secondary | ICD-10-CM | POA: Diagnosis not present

## 2024-01-06 DIAGNOSIS — I10 Essential (primary) hypertension: Secondary | ICD-10-CM | POA: Diagnosis not present

## 2024-01-06 DIAGNOSIS — D0511 Intraductal carcinoma in situ of right breast: Secondary | ICD-10-CM | POA: Diagnosis not present

## 2024-01-06 DIAGNOSIS — M25562 Pain in left knee: Secondary | ICD-10-CM | POA: Diagnosis not present

## 2024-01-06 DIAGNOSIS — E782 Mixed hyperlipidemia: Secondary | ICD-10-CM | POA: Diagnosis not present

## 2024-01-06 DIAGNOSIS — Z0000 Encounter for general adult medical examination without abnormal findings: Secondary | ICD-10-CM | POA: Diagnosis not present

## 2024-01-14 ENCOUNTER — Ambulatory Visit (HOSPITAL_BASED_OUTPATIENT_CLINIC_OR_DEPARTMENT_OTHER)

## 2024-01-14 ENCOUNTER — Ambulatory Visit (HOSPITAL_BASED_OUTPATIENT_CLINIC_OR_DEPARTMENT_OTHER): Admitting: Orthopaedic Surgery

## 2024-01-14 DIAGNOSIS — M1712 Unilateral primary osteoarthritis, left knee: Secondary | ICD-10-CM

## 2024-01-14 DIAGNOSIS — M25562 Pain in left knee: Secondary | ICD-10-CM

## 2024-01-14 DIAGNOSIS — M25462 Effusion, left knee: Secondary | ICD-10-CM | POA: Diagnosis not present

## 2024-01-14 MED ORDER — TRIAMCINOLONE ACETONIDE 40 MG/ML IJ SUSP
80.0000 mg | INTRAMUSCULAR | Status: AC | PRN
Start: 2024-01-14 — End: 2024-01-14
  Administered 2024-01-14: 80 mg via INTRA_ARTICULAR

## 2024-01-14 MED ORDER — LIDOCAINE HCL 1 % IJ SOLN
4.0000 mL | INTRAMUSCULAR | Status: AC | PRN
Start: 2024-01-14 — End: 2024-01-14
  Administered 2024-01-14: 4 mL

## 2024-01-14 NOTE — Progress Notes (Signed)
 Chief Complaint: Left knee pain     History of Present Illness:    Victoria Fuller is a 73 y.o. female presents today with ongoing left knee pain after initially visiting the emergency room 1 month prior.  She did not have any specific injury.  She has been experiencing pain in the posterior aspect of the knee.  She has not previously had any injections.  The entire knee feels like it is painful.  Denies any buckling.  She has been using an over-the-counter brace    PMH/PSH/Family History/Social History/Meds/Allergies:    Past Medical History:  Diagnosis Date  . Cancer (HCC) 08/2017   Left breast cancer  . Hyperlipidemia   . Hypertension   . Personal history of radiation therapy    left and right breasts  . Wears contact lenses    Past Surgical History:  Procedure Laterality Date  . ABDOMINAL HYSTERECTOMY    . BREAST BIOPSY Left 08/25/2017  . BREAST BIOPSY Right 10/06/2019  . BREAST LUMPECTOMY Left 08/31/2017  . BREAST LUMPECTOMY Right 11/18/2019  . BREAST LUMPECTOMY WITH RADIOACTIVE SEED LOCALIZATION Left 09/10/2017   Procedure: BREAST LUMPECTOMY WITH RADIOACTIVE SEED LOCALIZATION;  Surgeon: Aron Shoulders, MD;  Location: [facility];  Service: General;  Laterality: Left;  . BREAST LUMPECTOMY WITH RADIOACTIVE SEED LOCALIZATION Right 11/18/2019   Procedure: RIGHT BREAST LUMPECTOMY WITH RADIOACTIVE SEED LOCALIZATION;  Surgeon: Aron Shoulders, MD;  Location: [facility];  Service: General;  Laterality: Right;  . CATARACT EXTRACTION    . COLONOSCOPY W/ BIOPSIES AND POLYPECTOMY    . DIAGNOSTIC LAPAROSCOPY     uterine polyps  . RE-EXCISION OF BREAST LUMPECTOMY Right 12/22/2019   Procedure: RIGHT BREAST RE-EXCISION OF BREAST LUMPECTOMY;  Surgeon: Aron Shoulders, MD;  Location: [facility] OR;  Service: General;  Laterality: Right;  . WISDOM TOOTH EXTRACTION     Social History   Socioeconomic History  . Marital status: Married    Spouse name: Not on file  . Number of  children: Not on file  . Years of education: Not on file  . Highest education level: Not on file  Occupational History  . Not on file  Tobacco Use  . Smoking status: Never  . Smokeless tobacco: Never  Vaping Use  . Vaping status: Never Used  Substance and Sexual Activity  . Alcohol use: Yes    Comment: rare  . Drug use: Never  . Sexual activity: Yes  Other Topics Concern  . Not on file  Social History Narrative  . Not on file   Social Drivers of Health   Financial Resource Strain: Not on file  Food Insecurity: Not on file  Transportation Needs: Not on file  Physical Activity: Not on file  Stress: Not on file  Social Connections: Not on file   Family History  Problem Relation Age of Onset  . Cancer Mother 34       ovarian cancer   . Prostate cancer Father    No Known Allergies Current Outpatient Medications  Medication Sig Dispense Refill  . amLODipine  (NORVASC ) 5 MG tablet Take 5 mg by mouth daily.    SABRA aspirin EC 81 MG tablet Take 81 mg by mouth 3 (three) times a week. Swallow whole.    . atorvastatin  (LIPITOR) 10 MG tablet Take 10 mg by mouth every other day.     . hydrochlorothiazide  (HYDRODIURIL ) 12.5 MG tablet Take 12.5 mg by mouth every morning.    . hydrochlorothiazide  (MICROZIDE ) 12.5 MG  capsule Take 12.5 mg by mouth daily.    . Multiple Vitamin (MULTIVITAMIN WITH MINERALS) TABS tablet Take 1 tablet by mouth daily.    SABRA ofloxacin (OCUFLOX) 0.3 % ophthalmic solution INSTILL 1 DROP INTO RIGHT EYE 4 TIMES DAILY FOR 7 DAYS    . potassium chloride  SA (KLOR-CON  M20) 20 MEQ tablet TAKE 1 TABLET BY MOUTH TWICE DAILY FOR 5 DAYS ,  THEN  ONCE  DAILY 14 tablet 0  . prednisoLONE acetate (PRED FORTE) 1 % ophthalmic suspension INSTILL 1 DROP INTO THE RIGHT EYE FOUR TIMES DAILY FOR 1 WEEK, THEN TWICE DAIY FOR 1 WEEK, THEN DAILY FOR 2 WEEKS, THEN STOP     No current facility-administered medications for this visit.   No results found.  Review of Systems:   A ROS was  performed including pertinent positives and negatives as documented in the HPI.  Physical Exam :   Constitutional: NAD and appears stated age Neurological: Alert and oriented Psych: Appropriate affect and cooperative There were no vitals taken for this visit.   Comprehensive Musculoskeletal Exam:    Left knee with tenderness about medial and lateral joint line as well as patellofemoral joint line.  Range of motion is from 0 to 130 degrees.  Distal neurosensory exams intact negative Lachman negative posterior drawer negative McMurray   Imaging:   Xray (4 views left knee): Mild tricompartmental osteoarthritis    I personally reviewed and interpreted the radiographs.   Assessment and Plan:   74 y.o. female with evidence of mild tricompartmental osteoarthritis.  At this time I would recommend an initial ultrasound-guided injection of the left knee.  She would like to proceed with this after verbal consent was obtained  -Left knee ultrasound-guided injection provided after verbal consent obtained    Procedure Note  Patient: Victoria Fuller             Date of Birth: Jan 09, 1951           MRN: [id number]             Visit Date: 01/14/2024  Procedures: Visit Diagnoses:  1. Unilateral primary osteoarthritis, left knee     Large Joint Inj: L knee on 01/14/2024 5:16 PM Indications: pain Details: 22 G 1.5 in needle, ultrasound-guided anterior approach  Arthrogram: No  Medications: 4 mL lidocaine  1 %; 80 mg triamcinolone  acetonide 40 MG/ML Outcome: tolerated well, no immediate complications Procedure, treatment alternatives, risks and benefits explained, specific risks discussed. Consent was given by the patient. Immediately prior to procedure a time out was called to verify the correct patient, procedure, equipment, support staff and site/side marked as required. Patient was prepped and draped in the usual sterile fashion.         I personally saw and evaluated the patient,  and participated in the management and treatment plan.  Elspeth Parker, MD Attending Physician, Orthopedic Surgery  This document was dictated using Dragon voice recognition software. A reasonable attempt at proof reading has been made to minimize errors.

## 2024-01-27 DIAGNOSIS — I10 Essential (primary) hypertension: Secondary | ICD-10-CM | POA: Diagnosis not present

## 2024-01-27 DIAGNOSIS — E782 Mixed hyperlipidemia: Secondary | ICD-10-CM | POA: Diagnosis not present

## 2024-01-27 DIAGNOSIS — D0511 Intraductal carcinoma in situ of right breast: Secondary | ICD-10-CM | POA: Diagnosis not present

## 2024-01-27 DIAGNOSIS — C50411 Malignant neoplasm of upper-outer quadrant of right female breast: Secondary | ICD-10-CM | POA: Diagnosis not present

## 2024-03-01 ENCOUNTER — Encounter: Payer: Self-pay | Admitting: Radiology

## 2024-04-12 NOTE — Assessment & Plan Note (Deleted)
 Stage 0 grade 2, ER+/PR+, G2 -Diagnosed in 07/2017, S/p left lumpectomy and adjuvant radiation.  -At the time she declined chemoprevention with antiestrogen therapy.

## 2024-04-12 NOTE — Assessment & Plan Note (Deleted)
 ER/PR+, Intermediate grade -Diagnosed in 09/2019. S/p right breast lumpectomy on 11/18/19 and re-excision on 12/22/19 with Dr Aron and no further malignancy was found. she completed adjuvant radiation with Dr Dewey on 01/24/20-02/18/20.  -She previously declined genetic testing.  -She completed 3 years of adjuvant Tamoxifen  on 02/28/20 - 02/2023 based on TAM 01 data -Last mammogram 09/2022 was benign.  She previously declined screening MRI -Victoria Fuller is clinically doing well.  Exam is benign, labs are stable.  Overall no clinical concern for recurrence -Continue surveillance, transition to annual follow-up

## 2024-04-12 NOTE — Progress Notes (Deleted)
 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 S/p right breast lumpectomy on 11/18/19 and re-excision on 12/22/19 with Dr Aron and no further malignancy was found. she completed adjuvant radiation with Dr Dewey on 01/24/20-02/18/20.  -She previously declined genetic testing.  -She completed 3 years of adjuvant Tamoxifen  on 02/28/20 - 02/2023 based on TAM 01 data -Last mammogram 09/2022 was benign.  She previously declined screening MRI -Victoria Fuller is clinically doing well.  Exam is benign, labs are stable.  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  -At the time she declined chemoprevention with antiestrogen therapy.      The patient understands the plans discussed today and is in agreement with them.  She knows to contact our office if she develops concerns prior to her next appointment.  I provided *** minutes of face-to-face time during this encounter and > 50% was spent counseling as documented under my assessment and plan.    Victoria FORBES Lessen, NP  [facility] - A DEPT OF [facility] [address] [place] [address] Dept: [phone] Dept Fax: [phone]   No orders of  the defined types were placed in this encounter.     CHIEF COMPLAINT:  CC: H/O bilateral DCIS  Current Treatment: Tamoxifen  10 mg daily (completed 02/2023)  INTERVAL HISTORY:  Victoria Fuller is here today for repeat clinical assessment.  She last saw Lacie, NP, 04/10/2023.  Most recent 3D screening mammogram done 10/09/2023.  She has breast density category B.  Overall results are benign.  New DEXA scan will need to be ordered.  She denies fevers or chills. She denies pain. Her appetite is good. Her weight {Weight change:10426}.  I have reviewed the past medical history, past surgical history, social history and family history with the patient and they are unchanged from previous note.  ALLERGIES:  has no known allergies.  MEDICATIONS:  Current Outpatient Medications  Medication Sig Dispense Refill   amLODipine (NORVASC) 5 MG tablet Take 5 mg by mouth daily.     aspirin EC 81 MG tablet Take 81 mg by mouth 3 (three) times a week. Swallow whole.     atorvastatin (LIPITOR) 10 MG tablet Take 10 mg by mouth every other day.      hydrochlorothiazide (HYDRODIURIL) 12.5 MG tablet Take 12.5 mg by mouth every morning.     hydrochlorothiazide (MICROZIDE) 12.5 MG capsule Take 12.5 mg by mouth daily.     Multiple Vitamin (MULTIVITAMIN WITH MINERALS) TABS tablet Take 1 tablet by mouth daily.     ofloxacin (OCUFLOX) 0.3 % ophthalmic solution INSTILL 1 DROP INTO RIGHT EYE 4 TIMES DAILY FOR 7 DAYS     potassium chloride  SA (KLOR-CON  M20) 20 MEQ tablet TAKE 1 TABLET BY MOUTH TWICE DAILY FOR 5 DAYS ,  THEN  ONCE  DAILY 14 tablet 0   prednisoLONE acetate (PRED FORTE) 1 % ophthalmic suspension INSTILL 1 DROP INTO THE RIGHT EYE FOUR TIMES DAILY FOR 1 WEEK, THEN TWICE DAIY FOR 1 WEEK, THEN DAILY FOR 2 WEEKS, THEN STOP     No current facility-administered medications for this visit.    HISTORY OF PRESENT ILLNESS:   Oncology History Overview Note  Cancer Staging Ductal carcinoma in situ (DCIS) of left  breast Staging form: Breast, AJCC 8th Edition - Clinical stage from 08/25/2017: Stage 0 (cTis (DCIS), cN0, cM0, ER: Unknown, PR: Unknown, HER2: Not Assessed) - Signed by Lanny Callander, MD on 09/03/2017 Nuclear grade: G2 Laterality: Left - Pathologic: Stage 0 (pTis (DCIS), pN0, cM0, ER+, PR+) - Unsigned  Ductal carcinoma in situ (DCIS) of right breast Staging form: Breast, AJCC 8th Edition - Clinical stage from 10/06/2019: Stage 0 (cTis (DCIS), cN0, cM0, ER+, PR+, HER2: Not Assessed) - Signed by Burton, Lacie K, NP on 06/12/2020 Stage prefix: Initial diagnosis Nuclear grade: G2 - Pathologic stage from 06/12/2020: Stage 0 (pTis (DCIS), pN0, cM0, ER+, PR+, HER2: Not Assessed) - Signed by Burton, Lacie K, NP on 06/12/2020 Nuclear grade: G2     Ductal carcinoma in situ (DCIS) of left breast  08/21/2017 Mammogram   IMPRESSION: New grouped pleomorphic calcifications within the upper-outer quadrant of the LEFT breast, spanning 4 mm extent. This is a suspicious finding for which stereotactic biopsy is recommended.   08/25/2017 Initial Biopsy   Diagnosis 08/25/17 Breast, left, needle core biopsy, upper outer quadrant coil clip - DUCTAL CARCINOMA IN SITU WITH CALCIFICATIONS, INTERMEDIATE GRADE. - DUCTAL PAPILLOMA. - SEE MICROSCOPIC DESCRIPTION.   08/25/2017 Cancer Staging   Staging form: Breast, AJCC 8th Edition - Clinical stage from 08/25/2017: Stage 0 (cTis (DCIS), cN0, cM0, ER: Unknown, PR: Unknown, HER2: Not Assessed) - Signed by Lanny Callander, MD on 09/03/2017   08/29/2017 Initial Diagnosis   Ductal carcinoma in situ (DCIS) of left breast   09/10/2017 Surgery   left BREAST LUMPECTOMY WITH RADIOACTIVE SEED LOCALIZATION by Aron Shoulders, MD   09/10/2017 Pathology Results   09/10/2017 Surgical Pathology Diagnosis Breast, lumpectomy - DUCTAL CARCINOMA IN SITU, INTERMEDIATE NUCLEAR GRADE, WITH CALCIFICATIONS - MARGINS UNINVOLVED BY CARCINOMA - DUCT ECTASIA AND PERIDUCTULAR CHRONIC INFLAMMATION -  FIBROADENOMATOID NODULE - PREVIOUS BIOPSY SITE CHANGES - SEE ONCOLOGY TABLE BELOW     10/08/2017 - 11/06/2017 Radiation Therapy   The patient initially received a dose of 42.56 Gy in 16 fractions to the breast using whole-breast tangent fields. This was delivered using a 3-D conformal technique. The patient then received a boost to the seroma. This delivered an additional 8 Gy in 4 fractions using a 3 field photon technique due to the depth of the seroma. The total dose was 50.56 Gy.    Anti-estrogen oral therapy   She declined anti-estrogen therapy due to concerns with side effects.    Ductal carcinoma in situ (DCIS) of right breast  09/29/2019 Mammogram   Diagnostic Mammogram on 09/29/19  IMPRESSION: Suspicious calcifications spanning 7mm in the 12 o'clock region of the right breast.   10/06/2019 Initial Biopsy   Diagnosis Breast, right, needle core biopsy, upper central, coil clip - DUCTAL CARCINOMA IN SITU WITH CALCIFICATIONS, INTERMEDIATE NUCLEAR GRADE. - SEE MICROSCOPIC DESCRIPTION. Microscopic Comment Estrogen and progesterone receptors will be performed. PROGNOSTIC INDICATOR RESULTS: Immunohistochemical and morphometric analysis performed manually Estrogen Receptor: 100%, STRONG STAINING INTESITY Progesterone Receptor: 80%, STRONG STAINING INTESITY   10/06/2019 Cancer Staging  Staging form: Breast, AJCC 8th Edition - Clinical stage from 10/06/2019: Stage 0 (cTis (DCIS), cN0, cM0, ER+, PR+, HER2: Not Assessed) - Signed by Ann Mayme POUR, NP on 06/12/2020 Stage prefix: Initial diagnosis Nuclear grade: G2   10/13/2019 Initial Diagnosis   Ductal carcinoma in situ (DCIS) of right breast   11/18/2019 Surgery   RIGHT BREAST LUMPECTOMY WITH RADIOACTIVE SEED LOCALIZATION with Dr Aron    11/18/2019 Pathology Results   FINAL MICROSCOPIC DIAGNOSIS:   A. BREAST, RIGHT, LUMPECTOMY:  - Ductal carcinoma in situ with calcifications, intermediate grade with  focal necrosis, 1.3 cm  - No  evidence of invasive carcinoma  - DCIS focally involves the lateral margin and is 0.3 cm from posterior  margin  - Atypical ductal hyperplasia, see comment  - Biopsy site changes  - See oncology table    COMMENT:   The small focus of ADH has a dissimilar appearance from rest of the DCIS  and is about 1 mm from the posterior margin.    12/22/2019 Surgery   RIGHT BREAST RE-EXCISION OF BREAST LUMPECTOMY with Dr Aron   12/22/2019 Pathology Results   FINAL MICROSCOPIC DIAGNOSIS:   A. BREAST, RIGHT, LATERAL MARGIN, RE-EXCISION:  - Resection site changes.  - Fibrocystic change.  - No malignancy identified.    01/24/2020 - 02/18/2020 Radiation Therapy   Adjuvant Radiation with Dr Dewey    02/2020 -  Anti-estrogen oral therapy   Tamoxifen  20mg  starting 02/2020, will start at 10mg  and if tolerable will increase 20mg .    06/12/2020 Survivorship   SCP reviewed virtually by Lacie Burton, NP. To be mailed to patient after visit    06/12/2020 Cancer Staging   Staging form: Breast, AJCC 8th Edition - Pathologic stage from 06/12/2020: Stage 0 (pTis (DCIS), pN0, cM0, ER+, PR+, HER2: Not Assessed) - Signed by Burton, Lacie K, NP on 06/12/2020 Nuclear grade: G2       REVIEW OF SYSTEMS:   Constitutional: Denies fevers, chills or abnormal weight loss Eyes: Denies blurriness of vision Ears, nose, mouth, throat, and face: Denies mucositis or sore throat Respiratory: Denies cough, dyspnea or wheezes Cardiovascular: Denies palpitation, chest discomfort or lower extremity swelling Gastrointestinal:  Denies nausea, heartburn or change in bowel habits Skin: Denies abnormal skin rashes Lymphatics: Denies new lymphadenopathy or easy bruising Neurological:Denies numbness, tingling or new weaknesses Behavioral/Psych: Mood is stable, no new changes  All other systems were reviewed with the patient and are negative.   VITALS:  There were no vitals taken for this visit.  Wt Readings from Last 3  Encounters:  12/10/23 178 lb (80.7 kg)  04/10/23 179 lb 14.4 oz (81.6 kg)  03/22/23 185 lb (83.9 kg)    There is no height or weight on file to calculate BMI.  Performance status (ECOG): {CHL ONC [id number]  PHYSICAL EXAM:   GENERAL:alert, no distress and comfortable SKIN: skin color, texture, turgor are normal, no rashes or significant lesions EYES: normal, Conjunctiva are pink and non-injected, sclera clear OROPHARYNX:no exudate, no erythema and lips, buccal mucosa, and tongue normal  NECK: supple, thyroid normal size, non-tender, without nodularity LYMPH:  no palpable lymphadenopathy in the cervical, axillary or inguinal LUNGS: clear to auscultation and percussion with normal breathing effort HEART: regular rate & rhythm and no murmurs and no lower extremity edema ABDOMEN:abdomen soft, non-tender and normal bowel sounds Musculoskeletal:no cyanosis of digits and no clubbing  NEURO: alert & oriented x 3 with fluent speech, no focal motor/sensory deficits  LABORATORY DATA:  I  have reviewed the data as listed    Component Value Date/Time   NA 142 04/10/2023 0904   K 3.8 04/10/2023 0904   CL 105 04/10/2023 0904   CO2 32 04/10/2023 0904   GLUCOSE 107 (H) 04/10/2023 0904   BUN 8 04/10/2023 0904   CREATININE 0.63 04/10/2023 0904   CALCIUM 9.8 04/10/2023 0904   PROT 7.0 04/10/2023 0904   ALBUMIN 4.5 04/10/2023 0904   AST 16 04/10/2023 0904   ALT 19 04/10/2023 0904   ALKPHOS 54 04/10/2023 0904   BILITOT 1.3 (H) 04/10/2023 0904   GFRNONAA >60 04/10/2023 0904   GFRAA >60 12/22/2019 0656   GFRAA >60 09/03/2017 1227    No results found for: SPEP, UPEP  Lab Results  Component Value Date   WBC 3.1 (L) 04/10/2023   NEUTROABS 1.6 (L) 04/10/2023   HGB 13.3 04/10/2023   HCT 39.4 04/10/2023   MCV 87.9 04/10/2023   PLT 157 04/10/2023      Chemistry      Component Value Date/Time   NA 142 04/10/2023 0904   K 3.8 04/10/2023 0904   CL 105 04/10/2023 0904   CO2 32  04/10/2023 0904   BUN 8 04/10/2023 0904   CREATININE 0.63 04/10/2023 0904      Component Value Date/Time   CALCIUM 9.8 04/10/2023 0904   ALKPHOS 54 04/10/2023 0904   AST 16 04/10/2023 0904   ALT 19 04/10/2023 0904   BILITOT 1.3 (H) 04/10/2023 0904       RADIOGRAPHIC STUDIES: I have personally reviewed the radiological images as listed and agreed with the findings in the report. No results found.

## 2024-04-14 ENCOUNTER — Other Ambulatory Visit: Payer: Self-pay

## 2024-04-14 DIAGNOSIS — D0511 Intraductal carcinoma in situ of right breast: Secondary | ICD-10-CM

## 2024-04-14 DIAGNOSIS — D0512 Intraductal carcinoma in situ of left breast: Secondary | ICD-10-CM

## 2024-04-15 ENCOUNTER — Inpatient Hospital Stay: Payer: Medicare Other | Attending: Nurse Practitioner

## 2024-04-15 ENCOUNTER — Inpatient Hospital Stay: Payer: Medicare Other | Admitting: Nurse Practitioner

## 2024-04-15 ENCOUNTER — Other Ambulatory Visit: Payer: Self-pay

## 2024-04-15 NOTE — Progress Notes (Signed)
 Called patient due to not showing up for her 100 lab appointment and her 1030 Powell Lessen NP appointment. No answer to call left a detailed message to call us  back if she had any questions. Will send a message to scheduling to have her rescheduled.

## 2024-05-31 ENCOUNTER — Other Ambulatory Visit
